# Patient Record
Sex: Female | Born: 1955 | Race: White | Hispanic: No | Marital: Single | Smoking: Never smoker
Health system: Southern US, Community
[De-identification: ages and names within clinical notes are randomized; demographics above are authoritative.]

## PROBLEM LIST (undated history)

## (undated) DIAGNOSIS — M1990 Unspecified osteoarthritis, unspecified site: Secondary | ICD-10-CM

## (undated) DIAGNOSIS — T148XXA Other injury of unspecified body region, initial encounter: Secondary | ICD-10-CM

## (undated) DIAGNOSIS — N950 Postmenopausal bleeding: Secondary | ICD-10-CM

## (undated) DIAGNOSIS — F419 Anxiety disorder, unspecified: Secondary | ICD-10-CM

## (undated) HISTORY — PX: CHOLECYSTECTOMY: SHX55

## (undated) HISTORY — PX: VASCULAR SURGERY: SHX849

---

## 2008-01-30 ENCOUNTER — Ambulatory Visit: Payer: Self-pay | Admitting: Family Medicine

## 2008-02-27 ENCOUNTER — Ambulatory Visit: Payer: Self-pay | Admitting: Family Medicine

## 2008-02-27 IMAGING — CR DG ABDOMEN 3V
1 series · 5 of 5 positions shown · non-contrast
Comparison: none

REASON FOR EXAM: Abdominal pain, nausea, vomiting, diarrhea
COMMENTS:

[Series 1: view not recorded · 0.17mm/px · 5 of 5 slices shown]
[im 1/5]
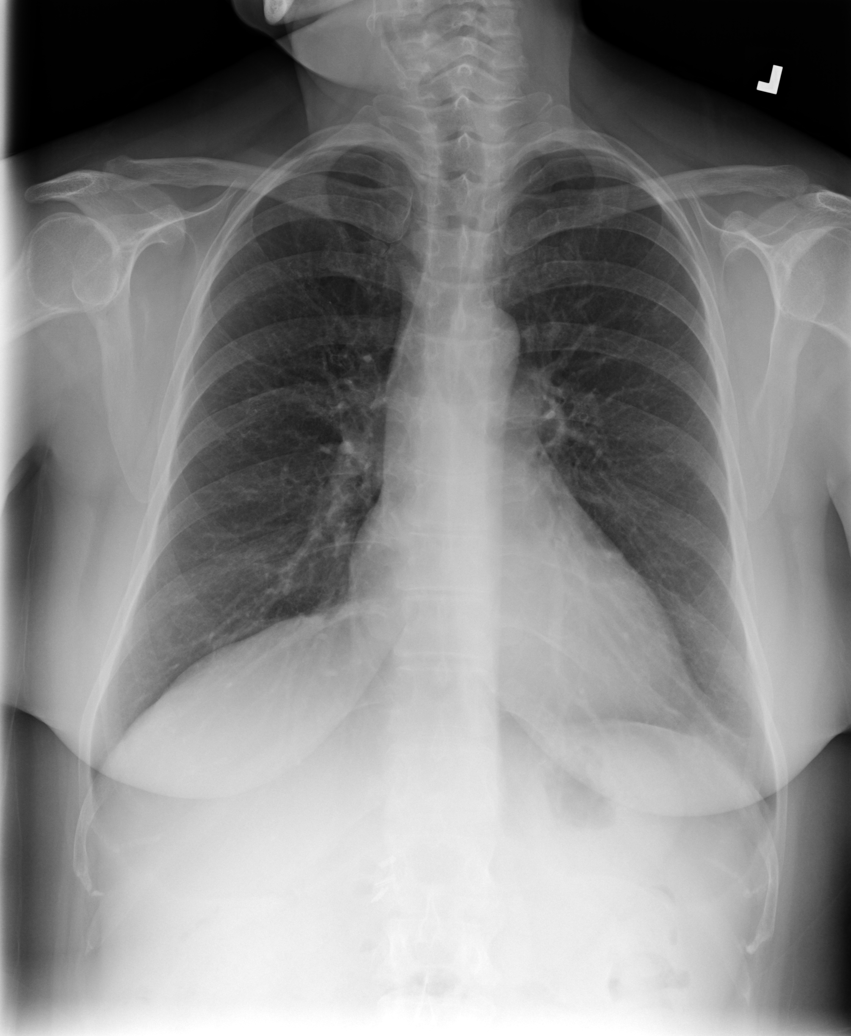
[im 2/5]
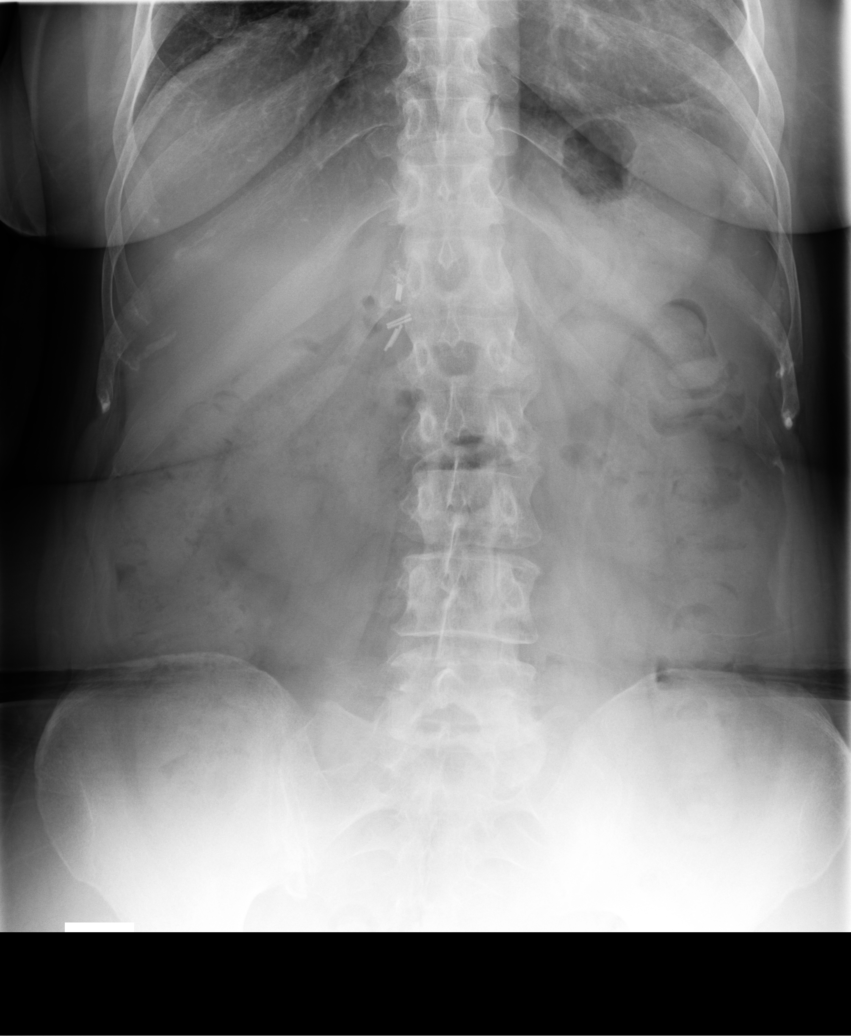
[im 3/5]
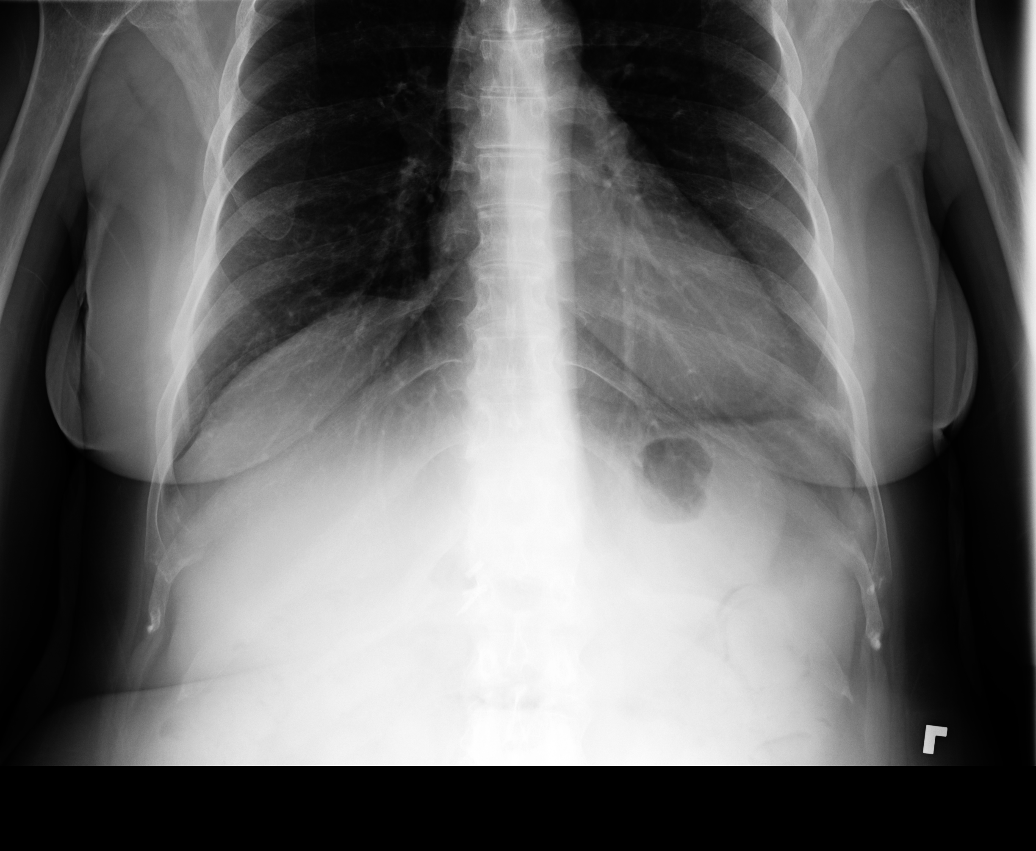
[im 4/5]
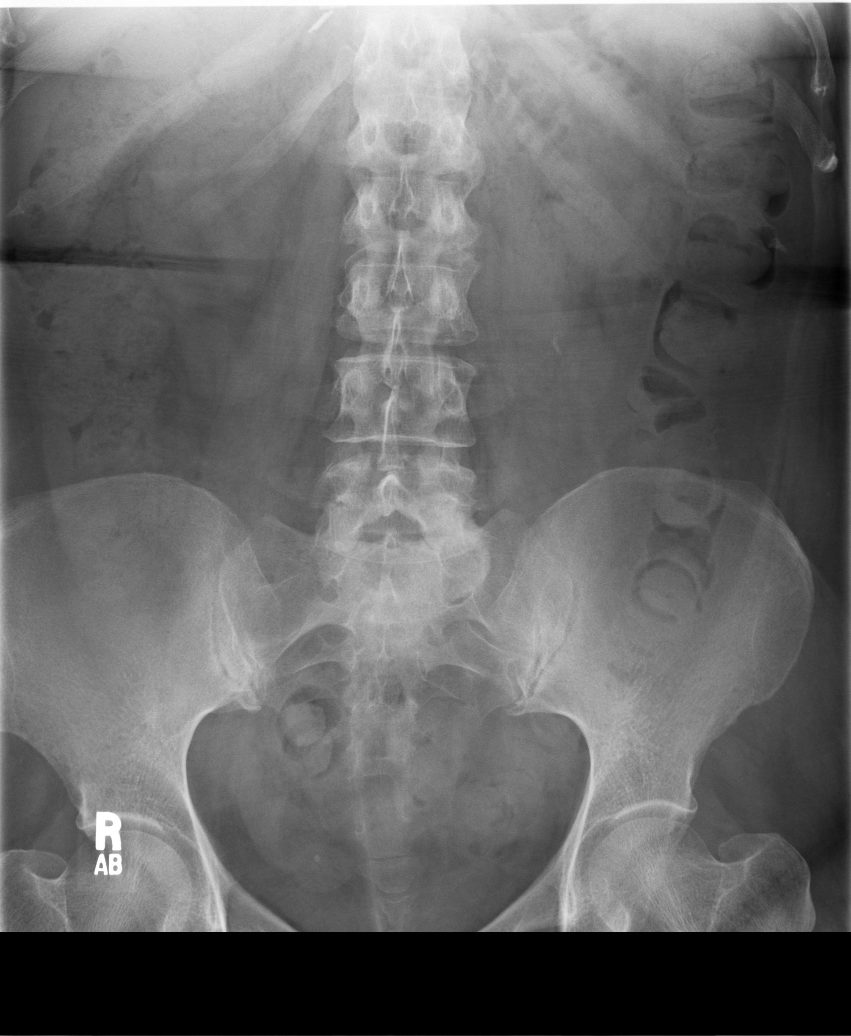
[im 5/5]
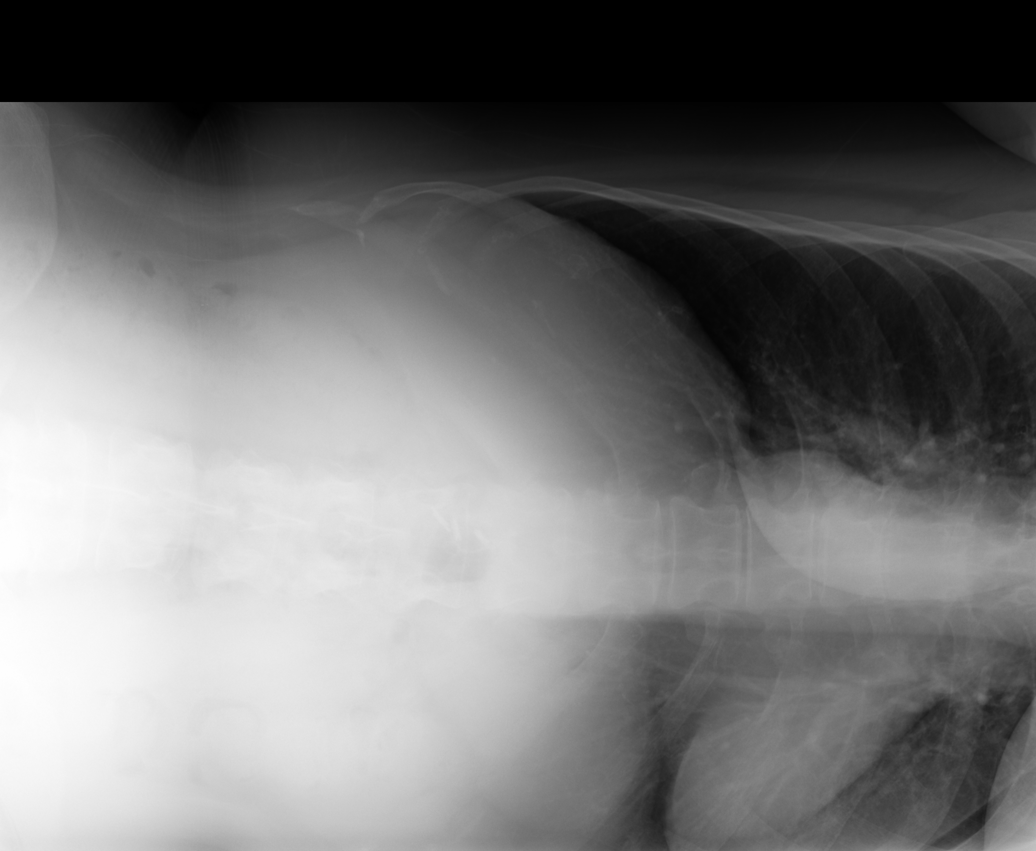

[5 of 5 positions shown; findings below may reference images not displayed]

PROCEDURE:     MDR - MDR ABDOMEN 3-WAY (INCL PA CH)  - [DATE] [DATE]

RESULT:     The lung fields are clear. The heart size is normal.

Four views of the abdomen were obtained. No subdiaphragmatic free air is
seen. There is a moderately large amount of fecal material in the colon. No
dilated loops of bowel suspicious for bowel obstruction are seen. The psoas
margins are visualized bilaterally. No abnormal intra-abdominal
calcifications are noted. No significant osseous abnormalities are
identified.
IMPRESSION: There is a moderately large amount of fecal material in the
colon. Otherwise, normal study.

## 2008-03-03 ENCOUNTER — Ambulatory Visit: Payer: Self-pay | Admitting: Family Medicine

## 2008-03-03 IMAGING — US ABDOMEN ULTRASOUND
1 series · 17 of 25 positions shown · non-contrast
Comparison: none

REASON FOR EXAM: Elevated LFT's, elevated hemidiaphram
COMMENTS:

[Series 1: abdomen ultrasound · 17 of 48 slices shown]
[im 1/48]
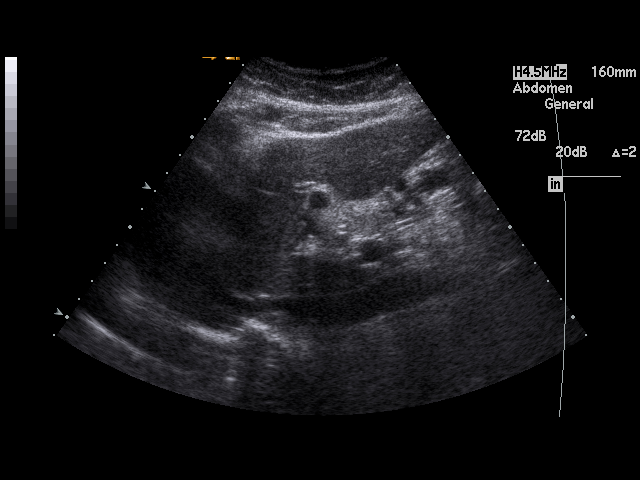
[im 4/48]
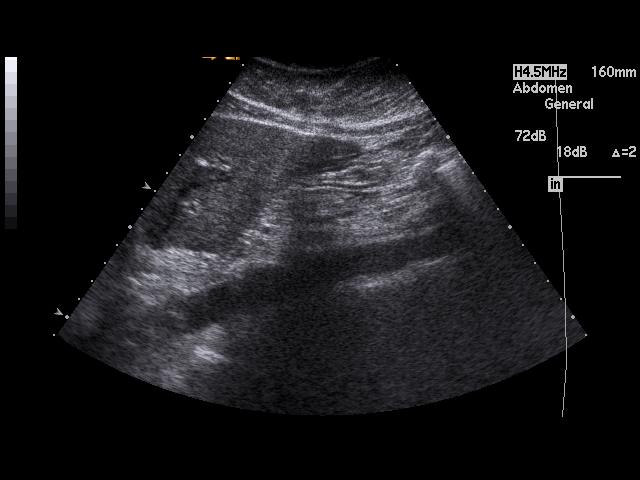
[im 6/48]
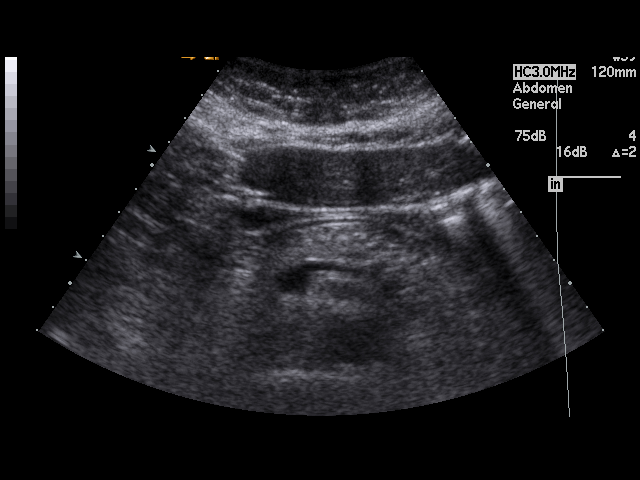
[im 10/48]
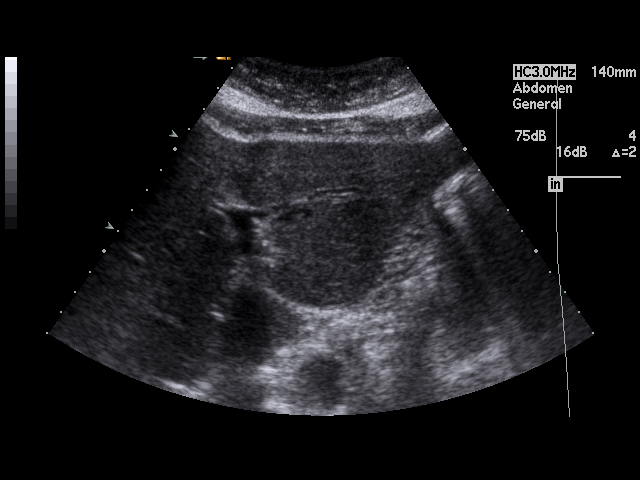
[im 12/48]
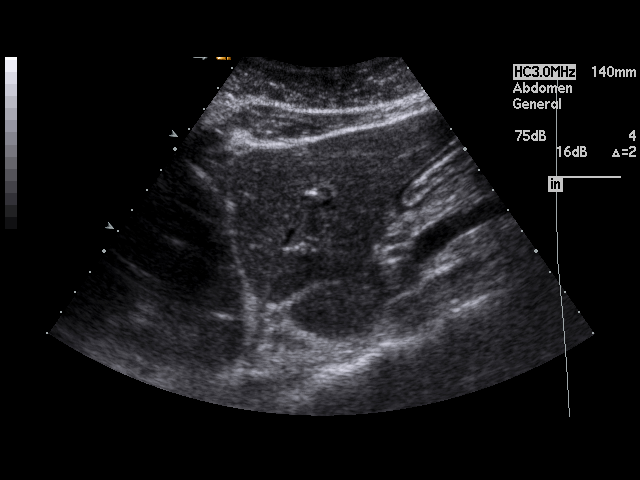
[im 16/48]
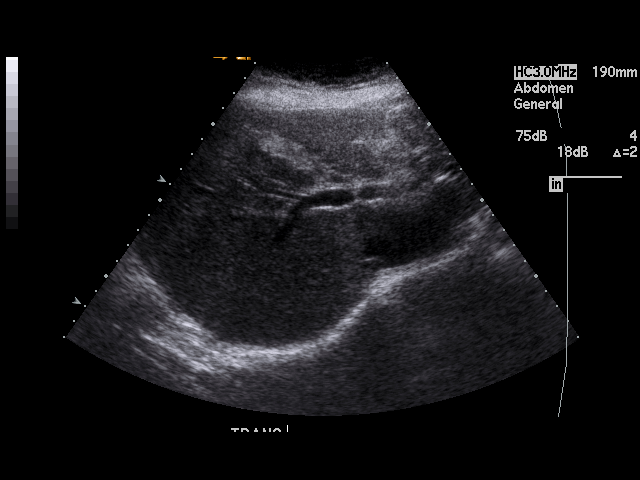
[im 18/48]
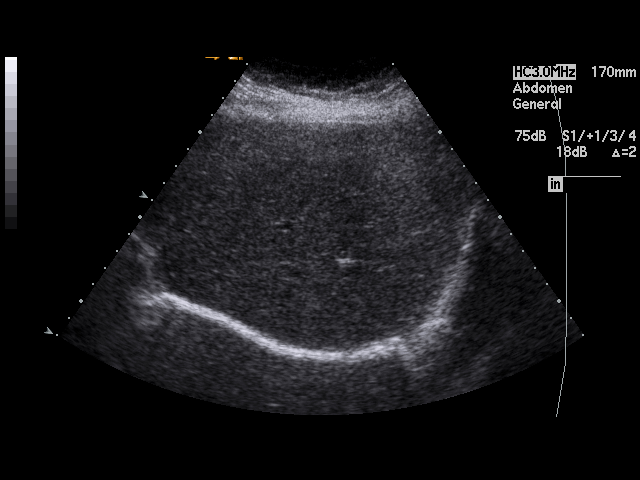
[im 22/48]
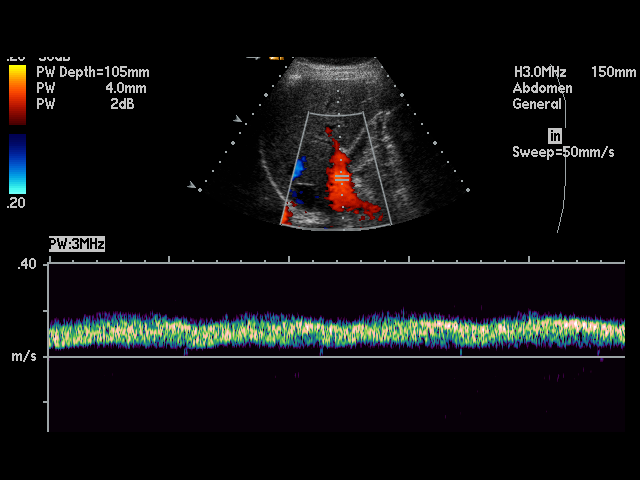
[im 24/48]
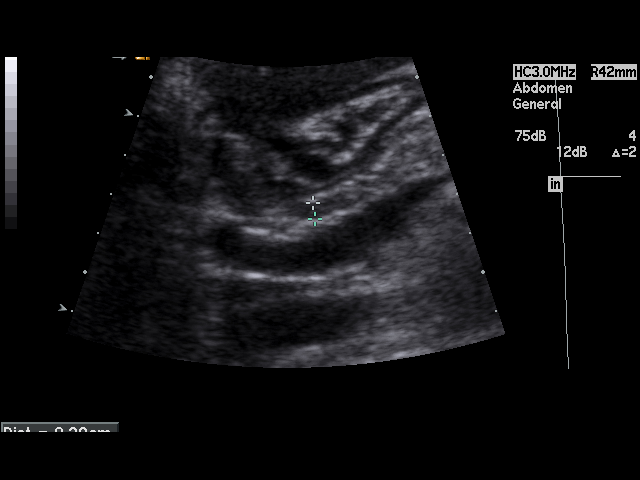
[im 26/48]
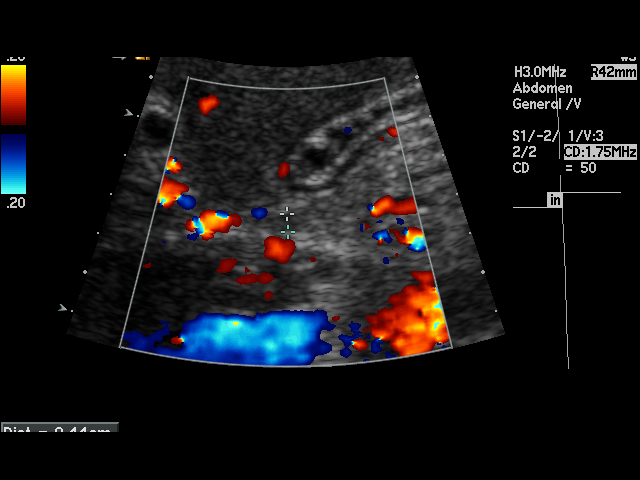
[im 30/48]
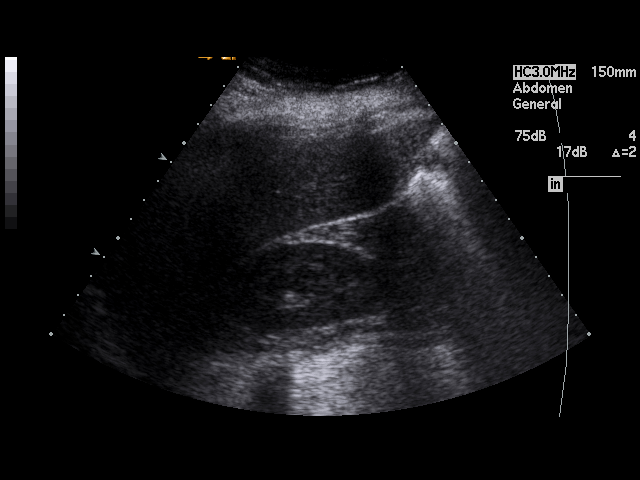
[im 32/48]
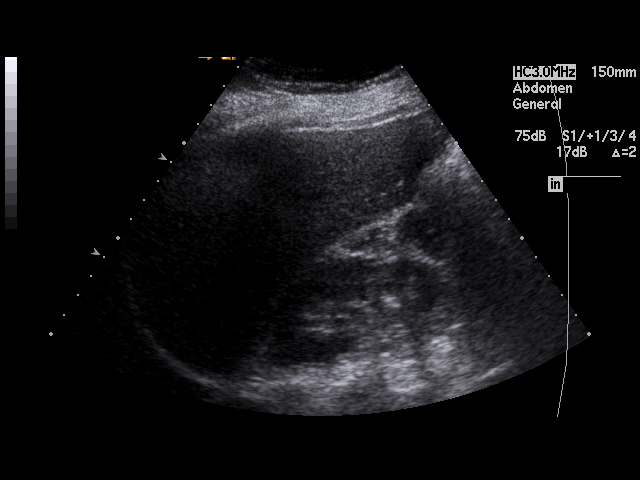
[im 36/48]
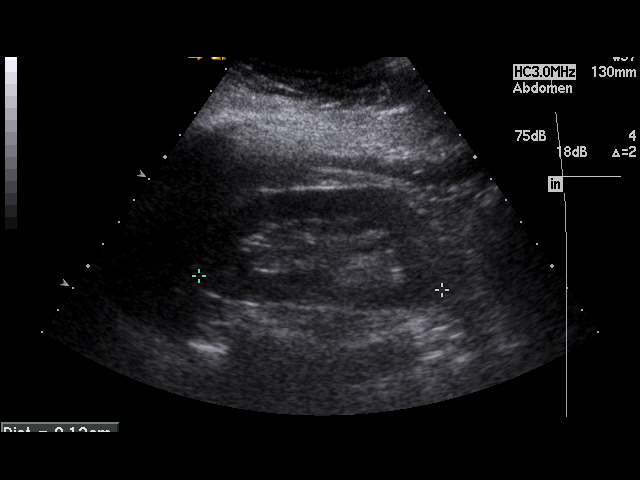
[im 38/48]
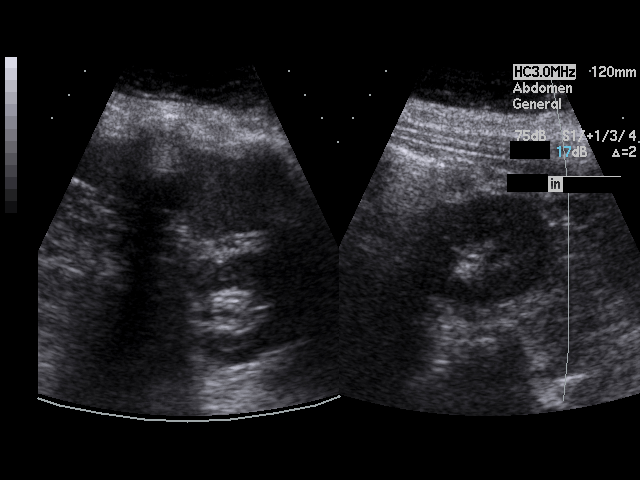
[im 42/48]
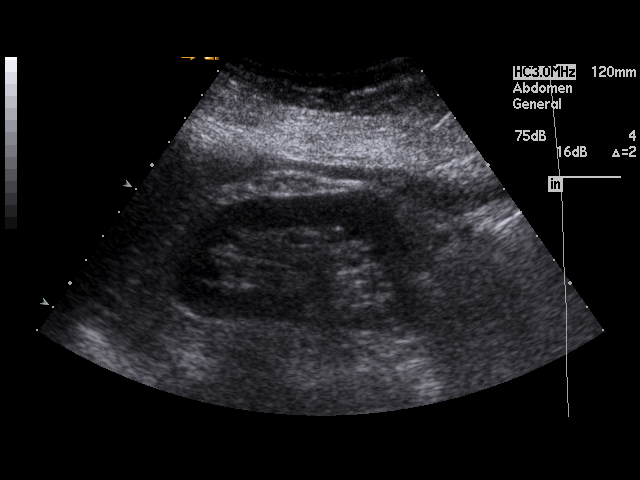
[im 44/48]
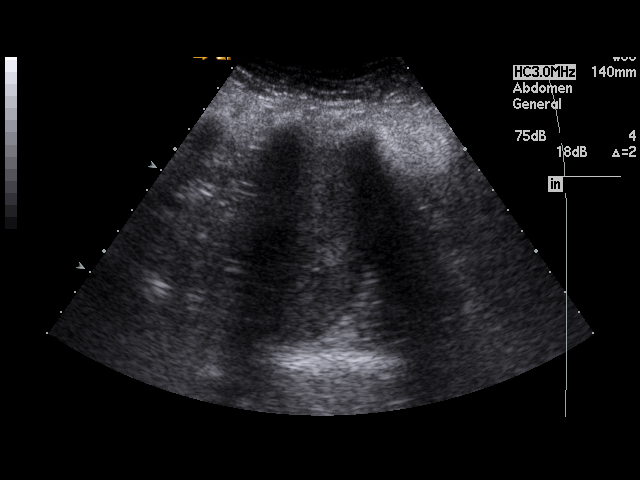
[im 48/48]
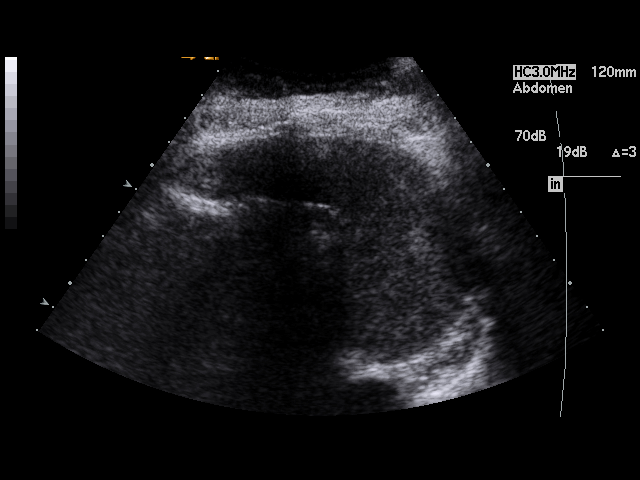

[17 of 25 positions shown; findings below may reference images not displayed]

PROCEDURE:     NOSA - NOSA ABDOMEN GENERAL SURVEY  - [DATE] [DATE]

RESULT:     The hepatic echo pattern is normal in appearance. The spleen
size is normal. There is a hyperechoic area in the spleen which is stable as
compared to the prior exam of [DATE] and likely represents focal
calcification or possibly an angiomyolipoma. The pancreas, abdominal aorta
and inferior vena cava show no significant abnormalities. The patient is
status post cholecystectomy. The common bile duct measures 4.4 mm in
diameter which is within normal limits. The kidneys show no hydronephrosis.
IMPRESSION: 1.  The patient is status post cholecystectomy.
2.  There is a stable, hyperechoic focus in the spleen.

## 2008-04-14 ENCOUNTER — Ambulatory Visit: Payer: Self-pay | Admitting: Internal Medicine

## 2008-08-22 ENCOUNTER — Ambulatory Visit: Payer: Self-pay | Admitting: Family Medicine

## 2009-03-05 ENCOUNTER — Ambulatory Visit: Payer: Self-pay | Admitting: Family Medicine

## 2009-07-11 ENCOUNTER — Emergency Department: Payer: Self-pay | Admitting: Emergency Medicine

## 2009-07-11 IMAGING — CR DG KNEE COMPLETE 4+V*R*
1 series · 4 of 4 positions shown · non-contrast
Comparison: none

REASON FOR EXAM: injury
COMMENTS:

[Series 1: view not recorded · 0.17mm/px · 4 of 4 slices shown]
[im 1/4]
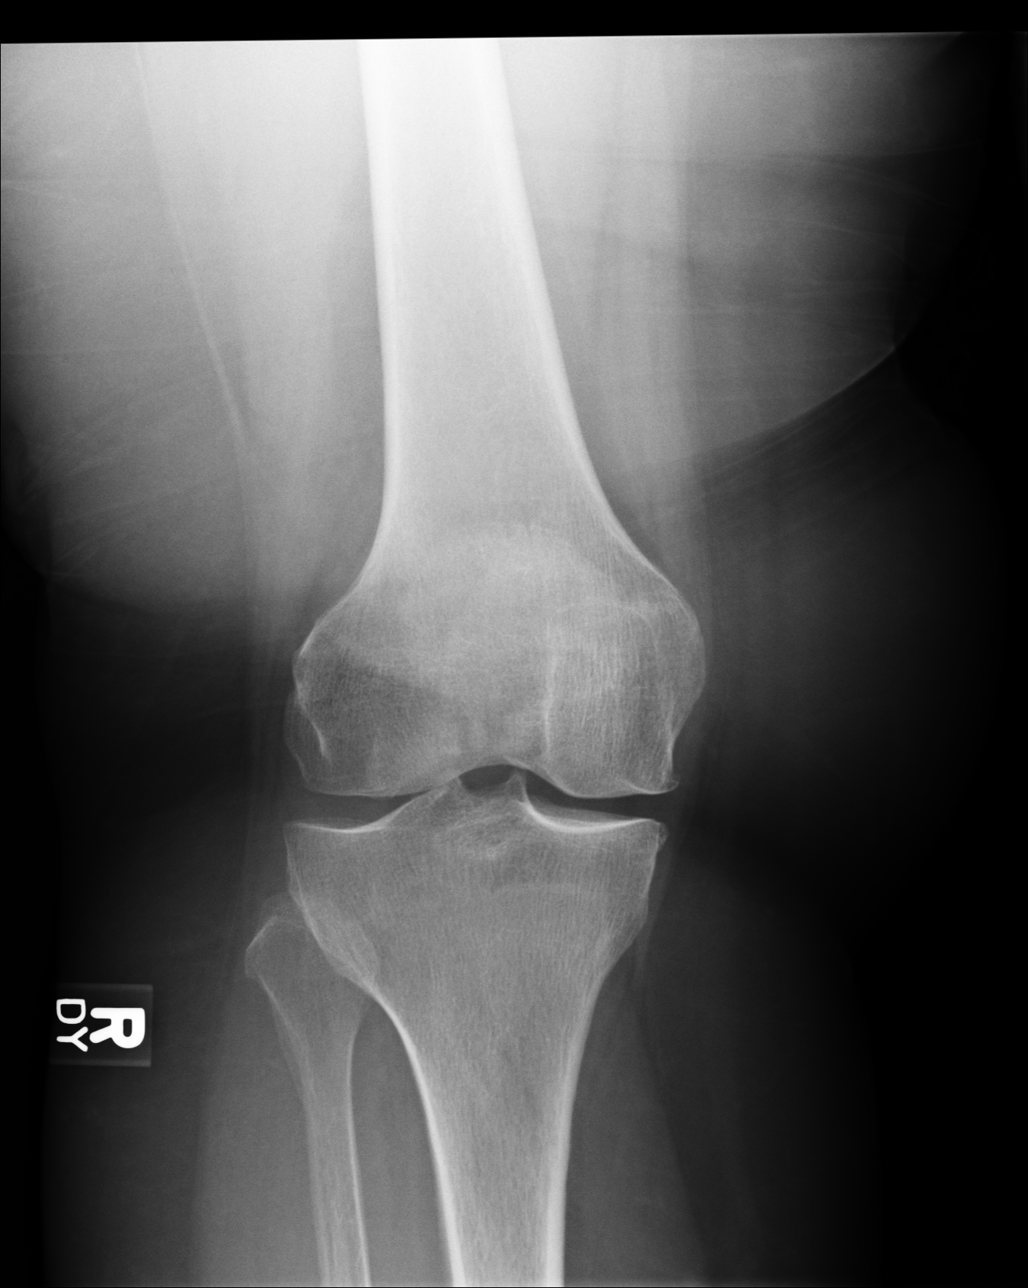
[im 2/4]
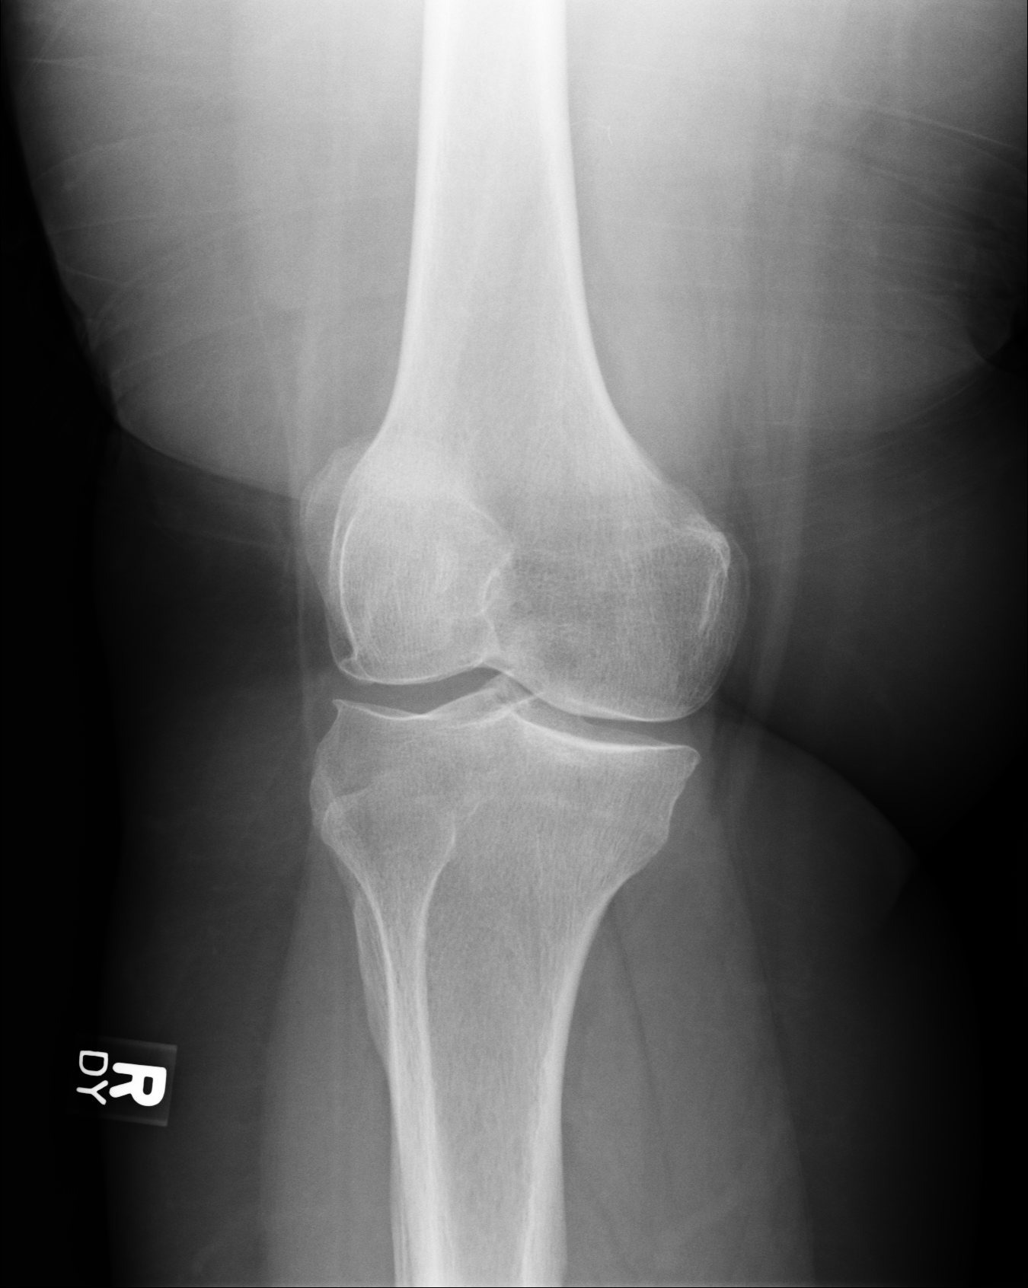
[im 3/4]
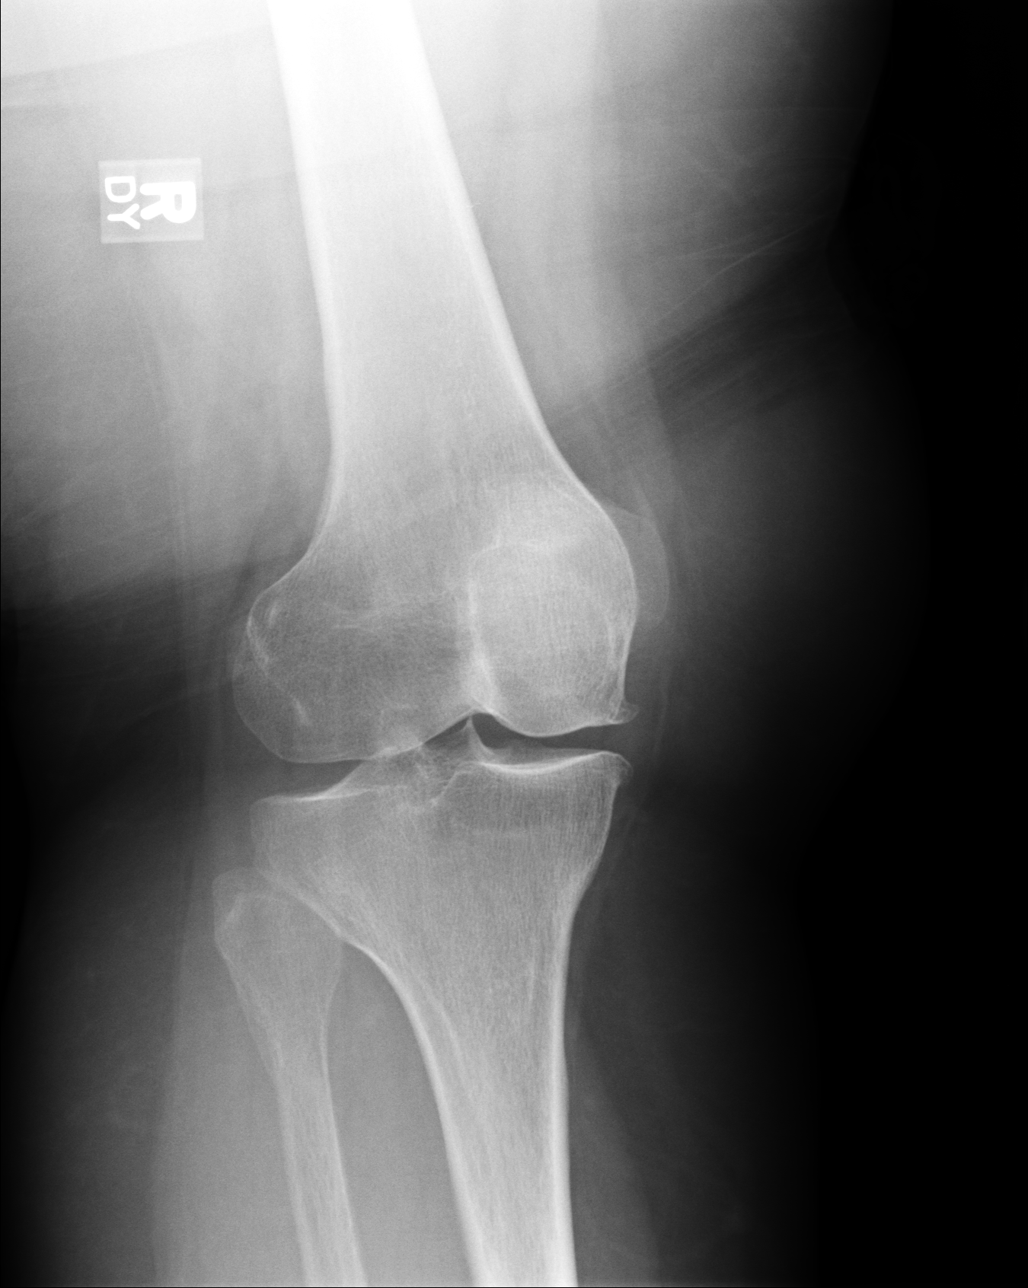
[im 4/4]
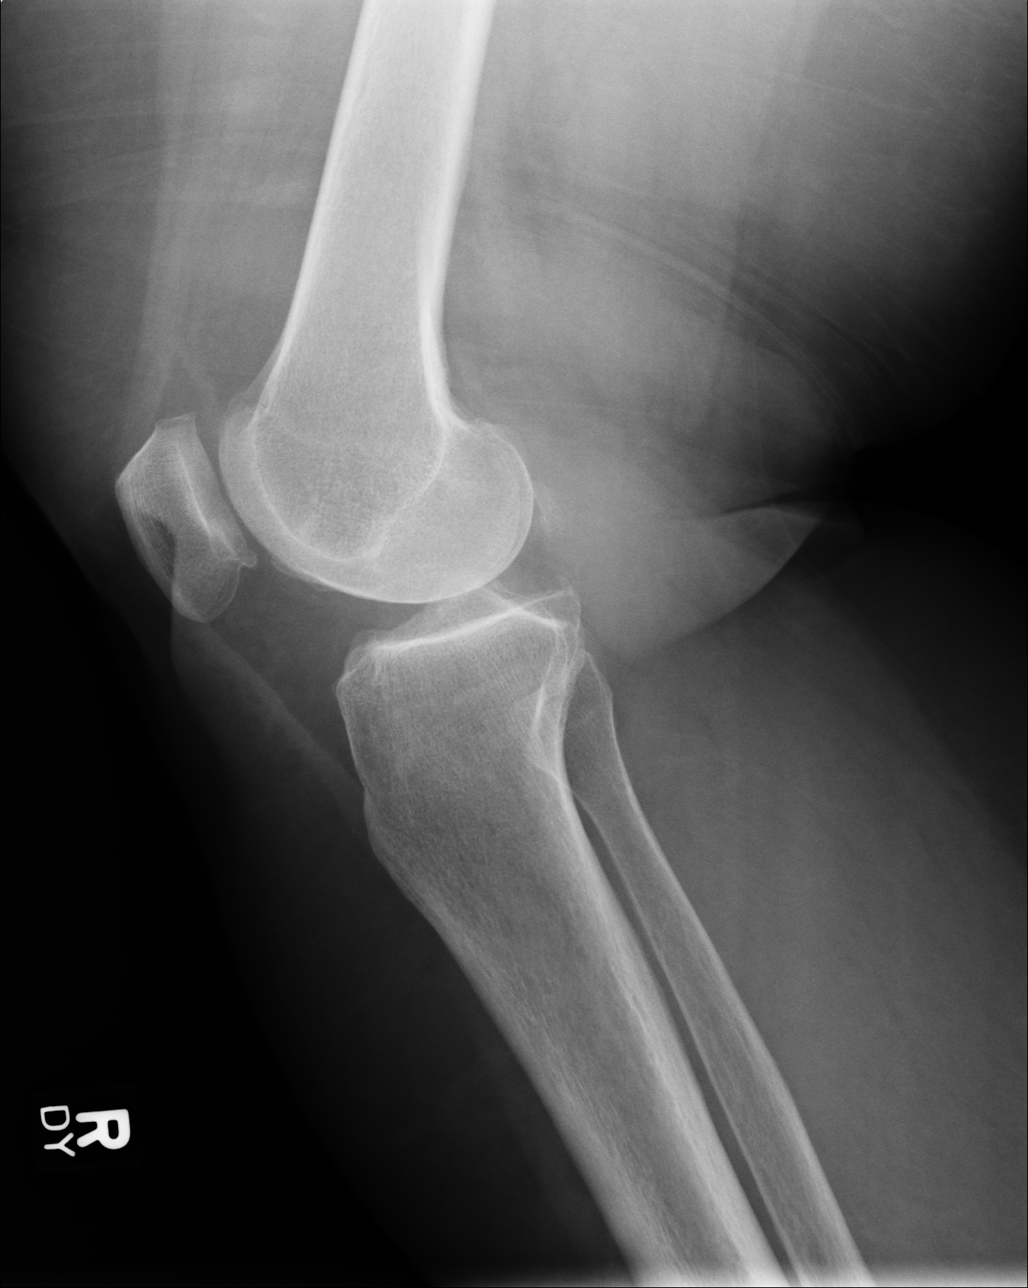

[4 of 4 positions shown; findings below may reference images not displayed]

PROCEDURE:     DXR - DXR KNEE RT COMP WITH OBLIQUES  - [DATE] [DATE]

RESULT:     No fracture, dislocation or other acute bony abnormality is
identified. There is mild degenerative spurring at the knee medially and
laterally. The knee joint space is well maintained. In the lateral view,
there is noted mild dorsal patella spurring.
IMPRESSION: 1. No fracture is seen.
2. There is degenerative spurring about the knee bilaterally.
3. There is also noted mild dorsal patella spurring.

## 2010-01-28 ENCOUNTER — Ambulatory Visit: Payer: Self-pay | Admitting: Internal Medicine

## 2010-02-13 ENCOUNTER — Ambulatory Visit: Payer: Self-pay | Admitting: Family Medicine

## 2010-02-16 ENCOUNTER — Ambulatory Visit: Payer: Self-pay | Admitting: Family Medicine

## 2010-02-16 IMAGING — CT CT ABD-PELV W/ CM
1 of 2 series · 15 of 32 positions shown, 19 images · non-contrast
Comparison: none

REASON FOR EXAM: Elevated LFT's Vomiting Diarrhea
COMMENTS:

[Series 3: soft tissue · axial · 0.70mm/px · z∈[-988,-574]mm · 15 of 152 slices shown, 19 images]
[im 7/152  soft-tissue]
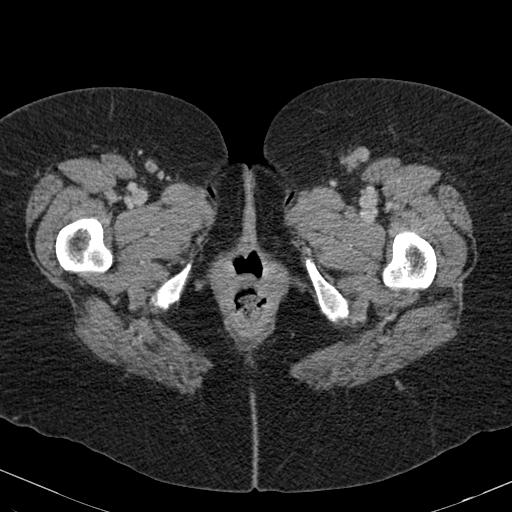
[im 7/152  bone]
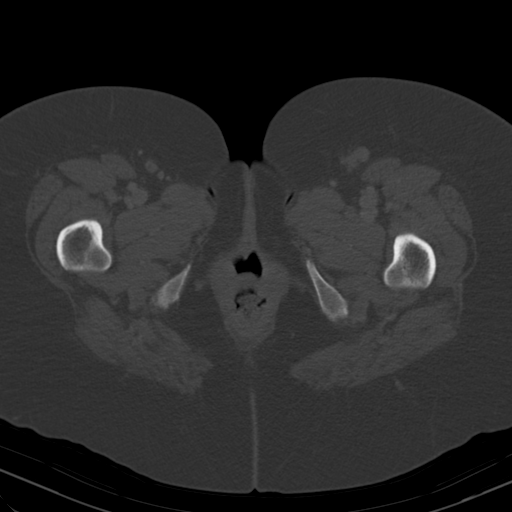
[im 20/152  soft-tissue]
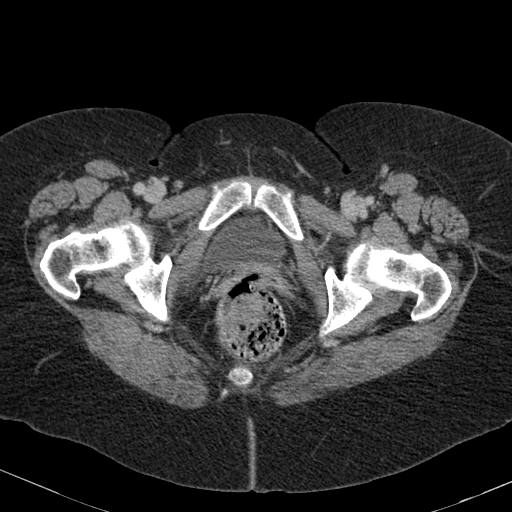
[im 33/152  soft-tissue]
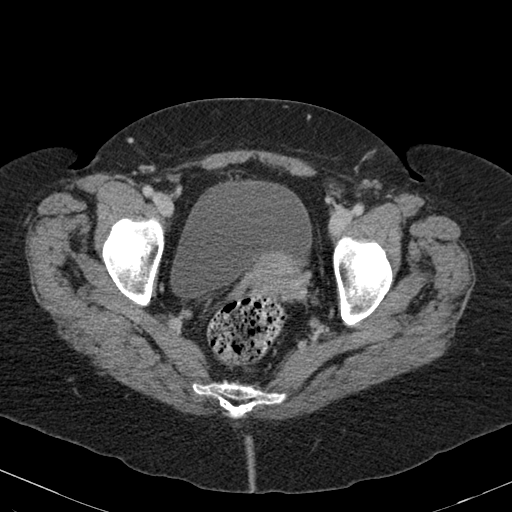
[im 40/152  soft-tissue]
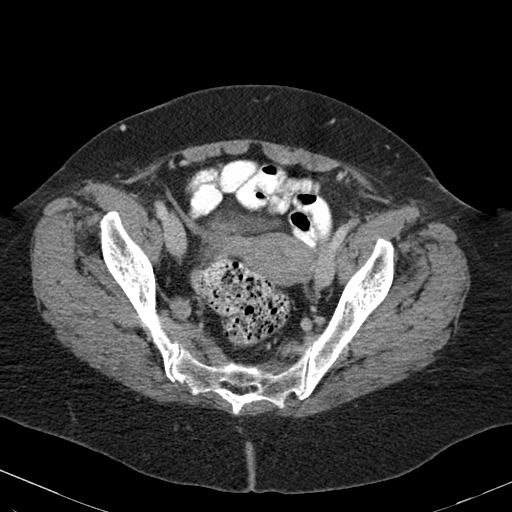
[im 53/152  soft-tissue]
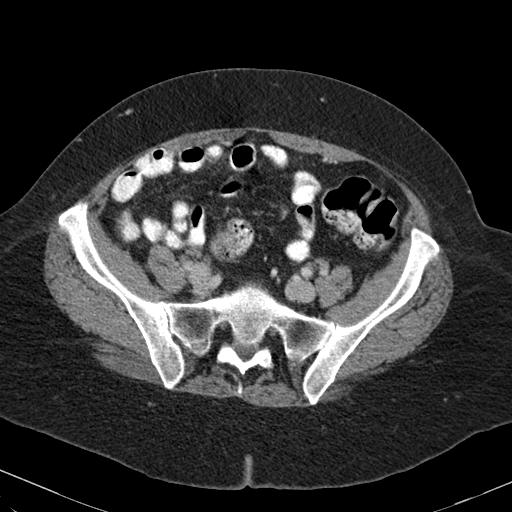
[im 66/152  soft-tissue]
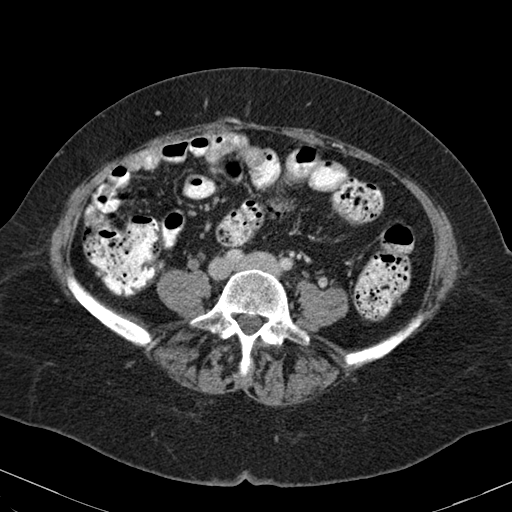
[im 79/152  soft-tissue]
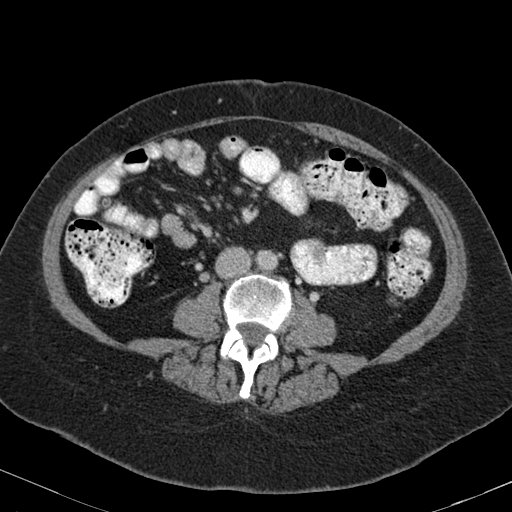
[im 86/152  soft-tissue]
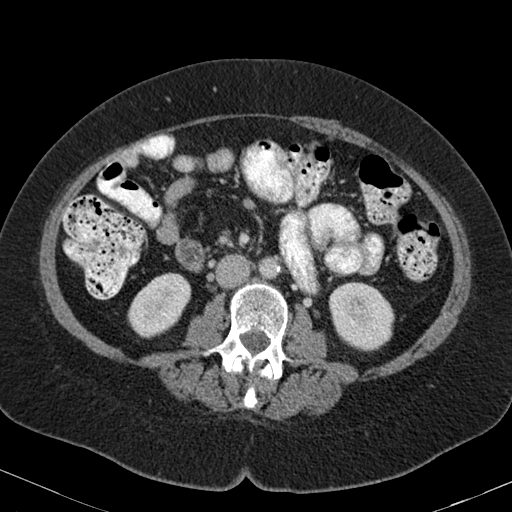
[im 99/152  soft-tissue]
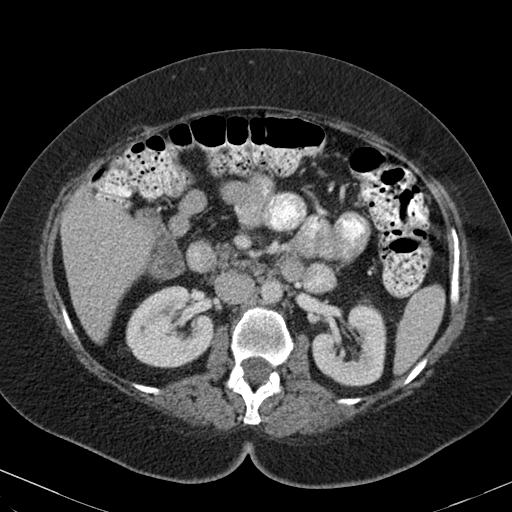
[im 99/152  bone]
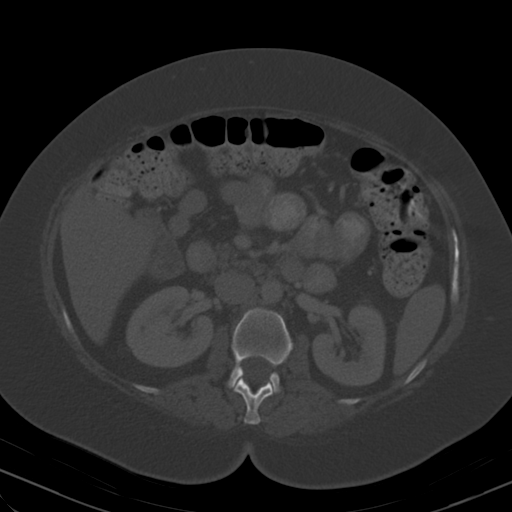
[im 112/152  soft-tissue]
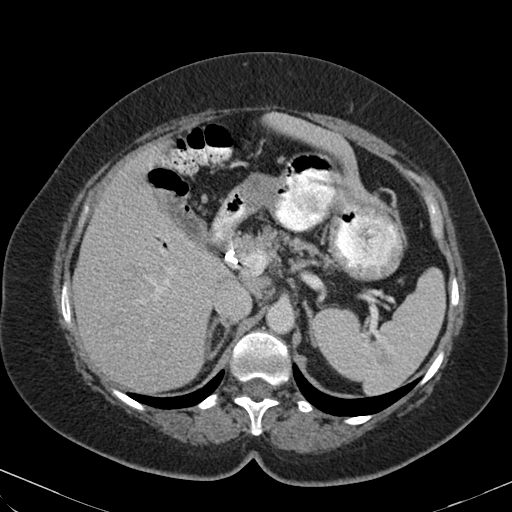
[im 119/152  soft-tissue]
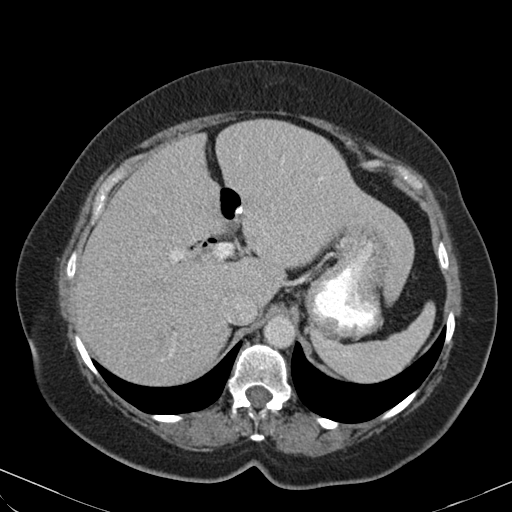
[im 125/152  lung]
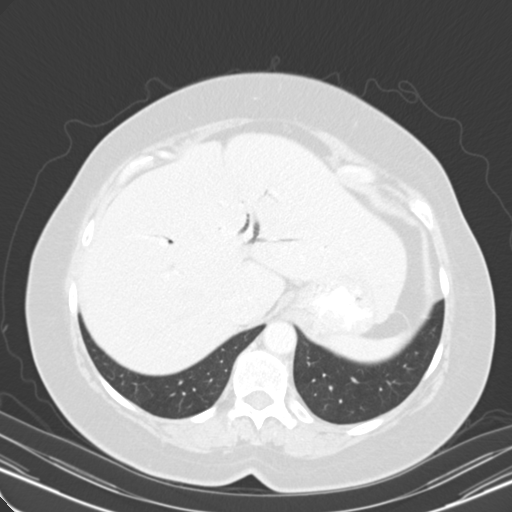
[im 132/152  soft-tissue]
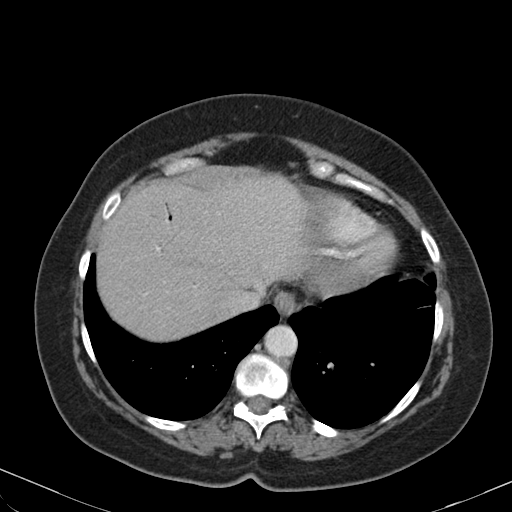
[im 132/152  lung]
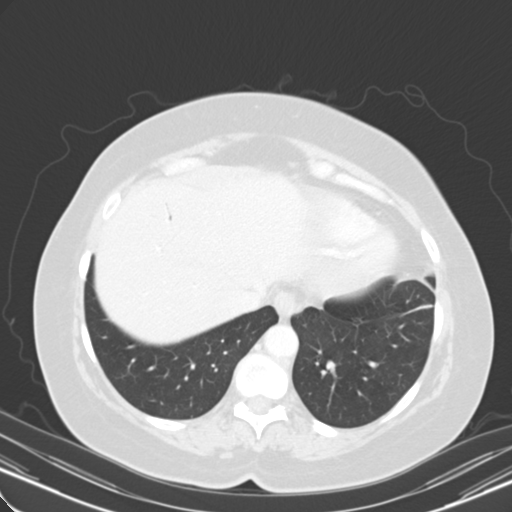
[im 138/152  lung]
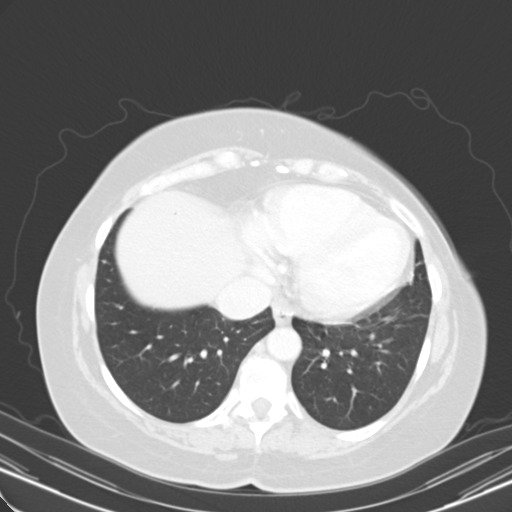
[im 145/152  soft-tissue]
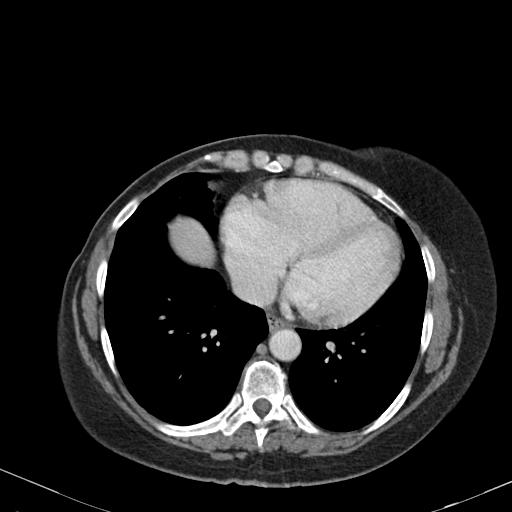
[im 145/152  lung]
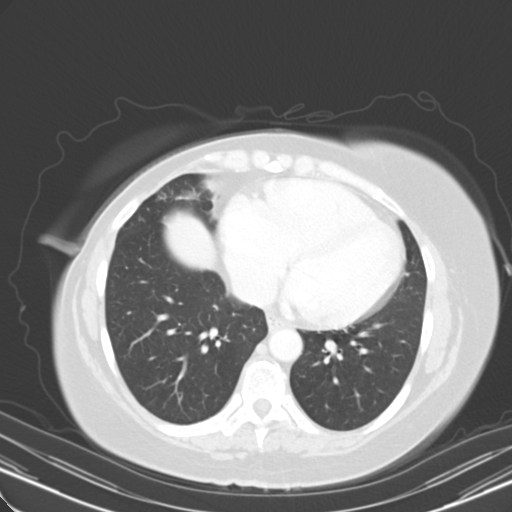

[15 of 32 positions shown; findings below may reference images not displayed]

PROCEDURE:     JEONG SUK - JEONG SUK ABDOMEN / PELVIS W  - [DATE]  [DATE]

RESULT:     Axial CT scanning was performed through the abdomen and pelvis
at 3 mm intervals and slice thicknesses following intravenous administration
of 100 cc of Isovue 300. The patient also received oral contrast material.
Review of 3-dimensional reconstructed images was performed separately on the
VIA monitor.

The liver exhibits normal density. There is biliary tract gas. The
gallbladder is apparently surgically absent. The pancreas exhibits fatty
infiltrative changes. There is no focal mass. The spleen, partially
distended stomach, adrenal glands, and kidneys are normal in appearance. The
caliber of the abdominal aorta is normal. There is no periaortic or
pericaval lymphadenopathy.

The partially contrast-filled loops of small and large bowel are normal in
appearance. The appendix is demonstrated and appears normal. The partially
distended urinary bladder is normal in appearance. The uterus and adnexal
structures appear normal for age. There is no free fluid in the pelvis.
There is no evidence of an inguinal nor umbilical hernia. The lumbar
vertebral bodies are preserved in height. The lung bases are clear.
IMPRESSION: 1. There is gas within the biliary tree likely due to prior sphincterotomy.
The gallbladder is surgically absent. I do not see evidence of hepatic
masses nor evidence of acute pancreatitis.
2. I do not see evidence of splenomegaly or intra-abdominal or pelvic
lymphadenopathy.
3. There is no evidence of acute urinary tract abnormality nor acute bowel
abnormality.

## 2010-05-19 ENCOUNTER — Ambulatory Visit: Payer: Self-pay | Admitting: Family Medicine

## 2012-09-12 ENCOUNTER — Emergency Department: Payer: Self-pay | Admitting: Internal Medicine

## 2012-09-12 LAB — URINALYSIS, COMPLETE
Bilirubin,UR: NEGATIVE
Glucose,UR: NEGATIVE mg/dL (ref 0–75)
Hyaline Cast: 1
Nitrite: NEGATIVE
Ph: 5 (ref 4.5–8.0)
Specific Gravity: 1.024 (ref 1.003–1.030)
Squamous Epithelial: 10

## 2012-09-12 LAB — CBC
HGB: 14.7 g/dL (ref 12.0–16.0)
MCV: 93 fL (ref 80–100)
RDW: 13.4 % (ref 11.5–14.5)
WBC: 8.5 10*3/uL (ref 3.6–11.0)

## 2012-09-12 LAB — CK TOTAL AND CKMB (NOT AT ARMC): CK-MB: 1.5 ng/mL (ref 0.5–3.6)

## 2012-09-12 LAB — COMPREHENSIVE METABOLIC PANEL
Albumin: 3.5 g/dL (ref 3.4–5.0)
Alkaline Phosphatase: 121 U/L (ref 50–136)
BUN: 10 mg/dL (ref 7–18)
Bilirubin,Total: 0.5 mg/dL (ref 0.2–1.0)
Chloride: 104 mmol/L (ref 98–107)
Creatinine: 0.58 mg/dL — ABNORMAL LOW (ref 0.60–1.30)
Osmolality: 277 (ref 275–301)
Potassium: 4 mmol/L (ref 3.5–5.1)
SGPT (ALT): 64 U/L (ref 12–78)
Sodium: 139 mmol/L (ref 136–145)
Total Protein: 8.1 g/dL (ref 6.4–8.2)

## 2012-09-12 LAB — TROPONIN I: Troponin-I: <0.02 ng/mL

## 2012-09-12 LAB — PROTIME-INR: Prothrombin Time: 12.3 secs (ref 11.5–14.7)

## 2012-09-12 IMAGING — CR DG CHEST 1V PORT
1 series · 1 of 1 positions shown · non-contrast
Comparison: none

REASON FOR EXAM: Chest Pain
COMMENTS:

[ap]
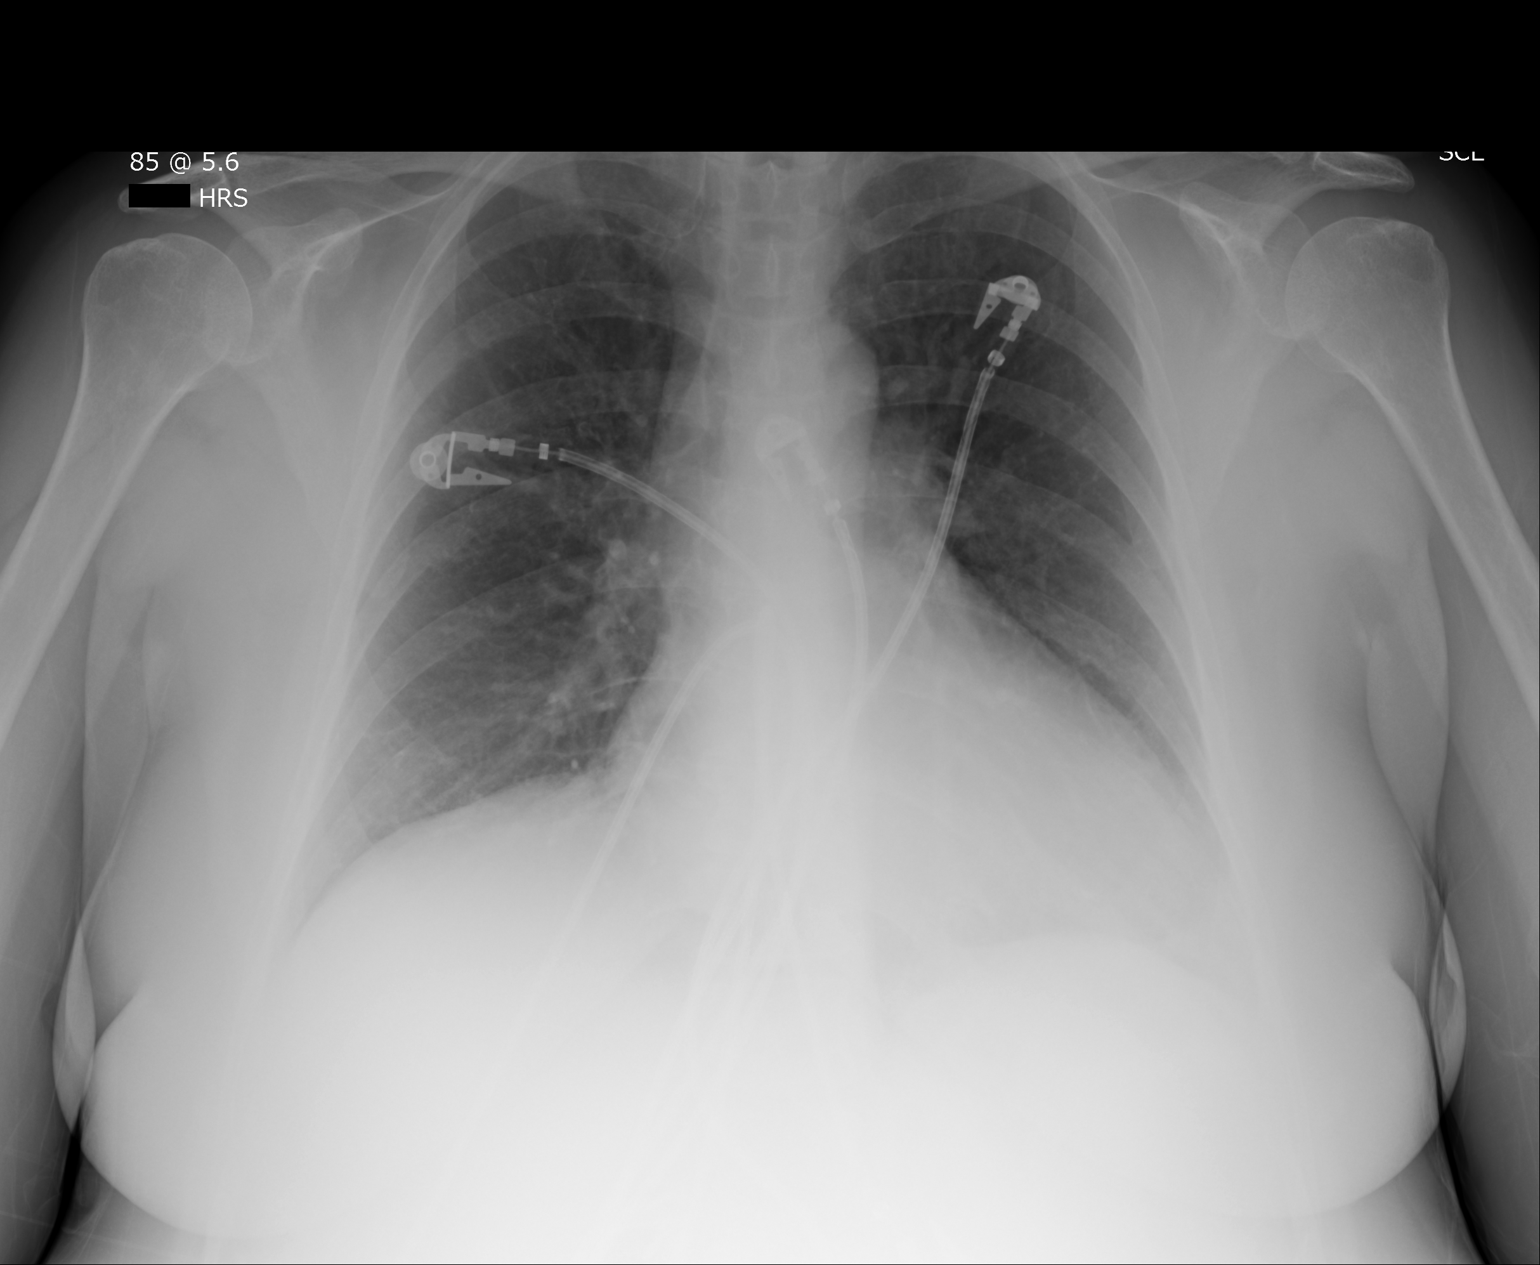

[1 of 1 positions shown; findings below may reference images not displayed]

PROCEDURE:     DXR - DXR PORTABLE CHEST SINGLE VIEW  - [DATE]  [DATE]

RESULT:     The lungs are adequately inflated. There is no focal infiltrate.
There is no evidence of a pneumothorax or pneumomediastinum. The cardiac
silhouette is mildly enlarged. The pulmonary vascularity is not engorged.
There is no pleural effusion. The mediastinum is normal in width.
IMPRESSION: There is mild enlargement of the cardiac silhouette without
overt evidence of CHF nor of pneumonia. A followup PA and lateral chest
x-ray would be of value.

[REDACTED]

## 2012-09-12 IMAGING — CT CT CHEST W/ CM
1 series · 15 of 33 positions shown, 19 images · IV contrast (APPLIED)
Comparison: none

REASON FOR EXAM: sob
COMMENTS:

PROCEDURE:     CT  - CT CHEST (FOR PE) W  - [DATE] [DATE]
RESULT:     Comparison: CT of the abdomen and pelvis [DATE]
TECHNIQUE: Multiple thin section axial images were obtained from the lung
apices to the upper abdomen following 100 ml Isovue 370 intravenous
contrast, according to the PE protocol. These images were also reviewed on a
[employer] multiplanar work station.

[Series 4: soft tissue · axial · 0.74mm/px · z∈[-634,-376]mm · 15 of 102 slices shown, 19 images]
[im 8/102  mediastinal]
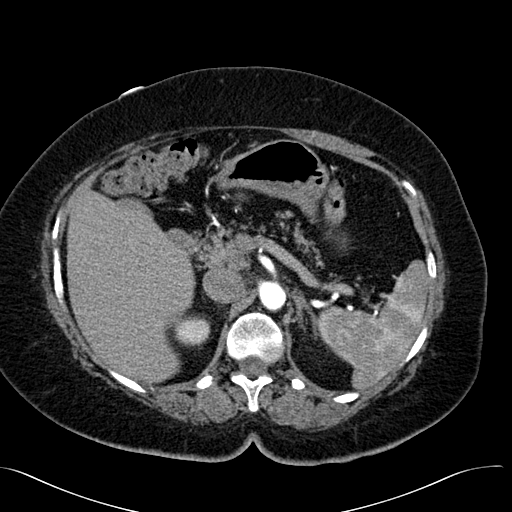
[im 8/102  lung]
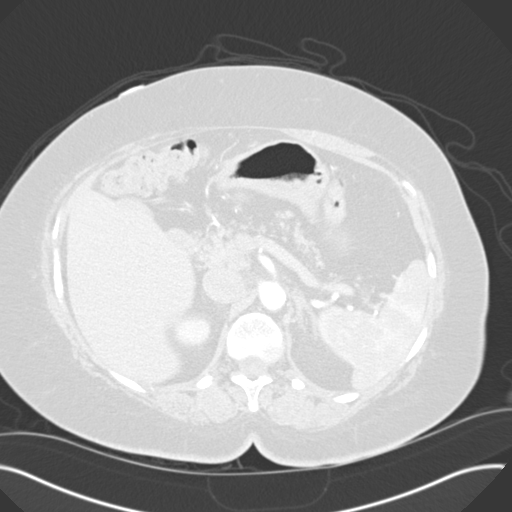
[im 15/102  lung]
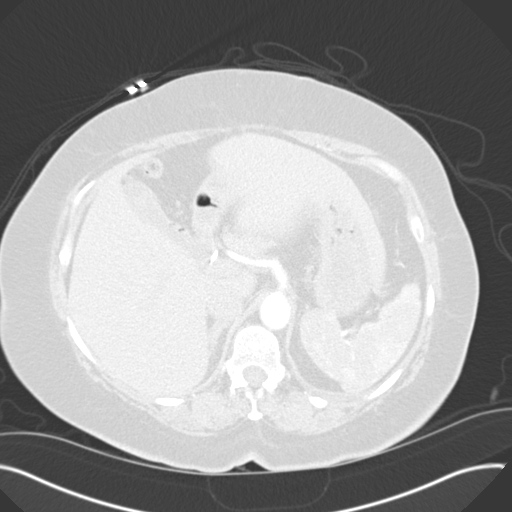
[im 21/102  lung]
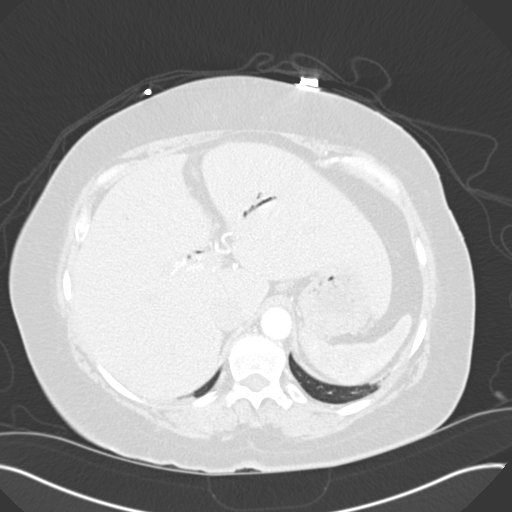
[im 27/102  lung]
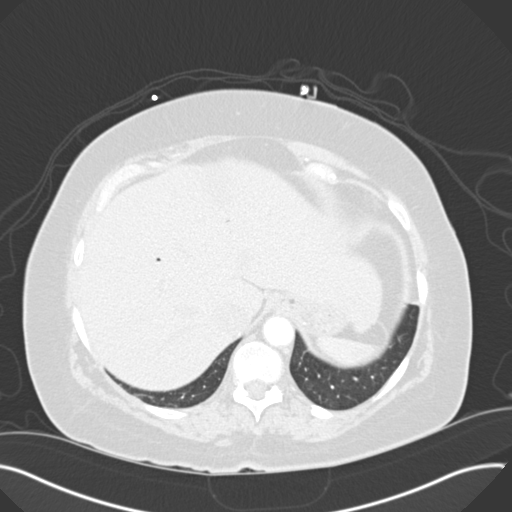
[im 34/102  mediastinal]
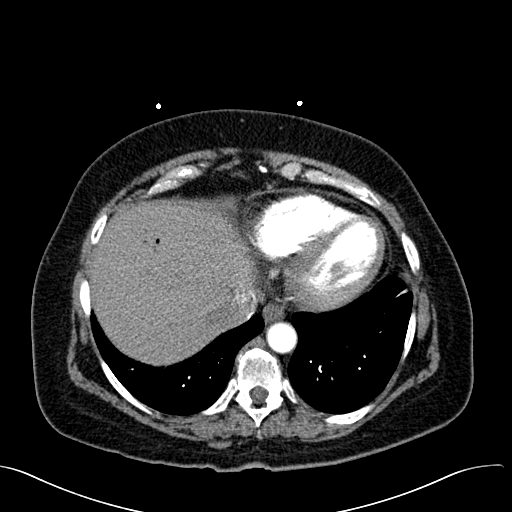
[im 34/102  lung]
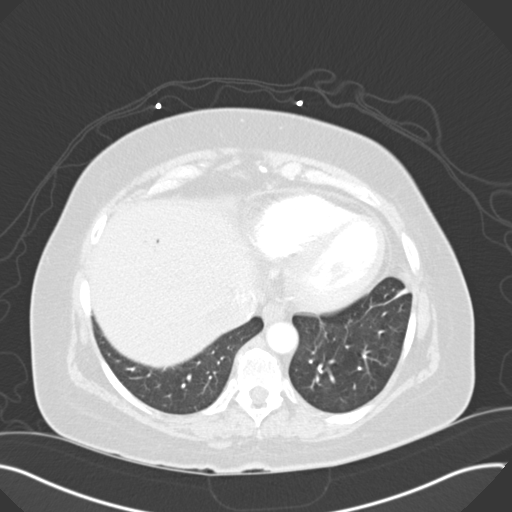
[im 41/102  lung]
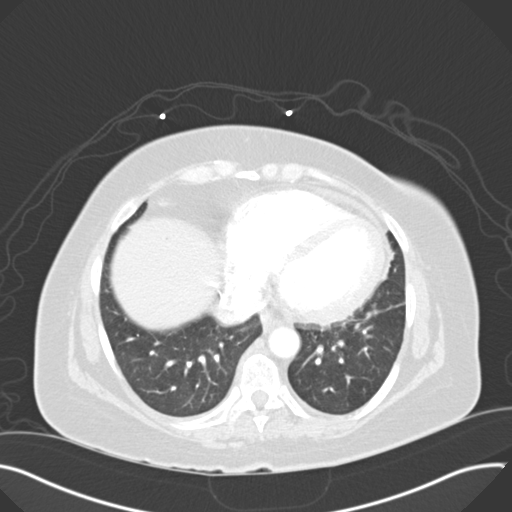
[im 45/102  lung]
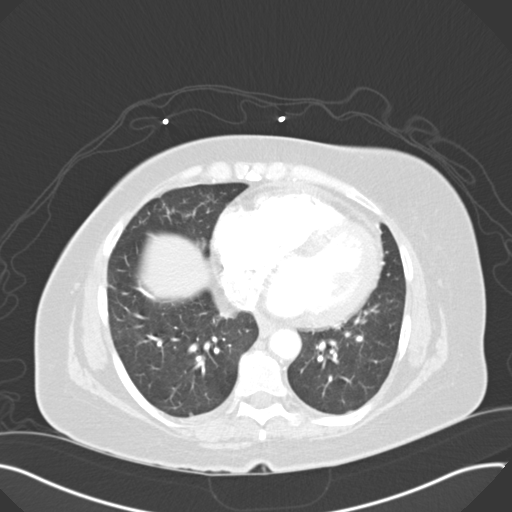
[im 53/102  lung]
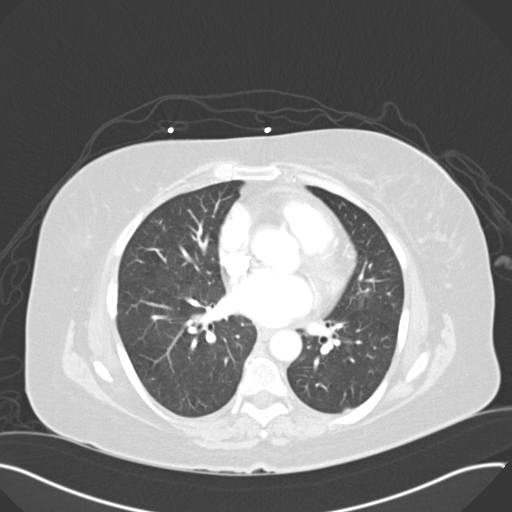
[im 57/102  mediastinal]
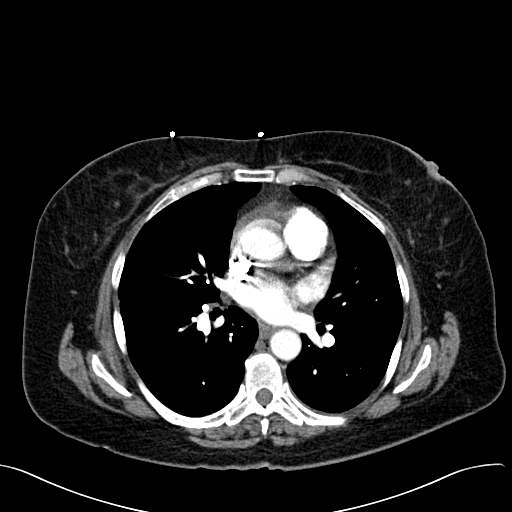
[im 57/102  lung]
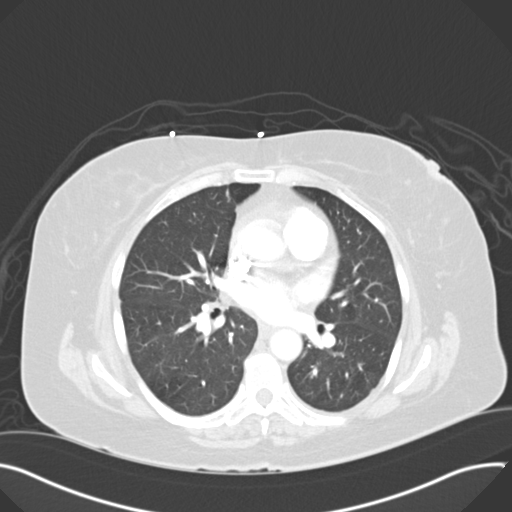
[im 61/102  lung]
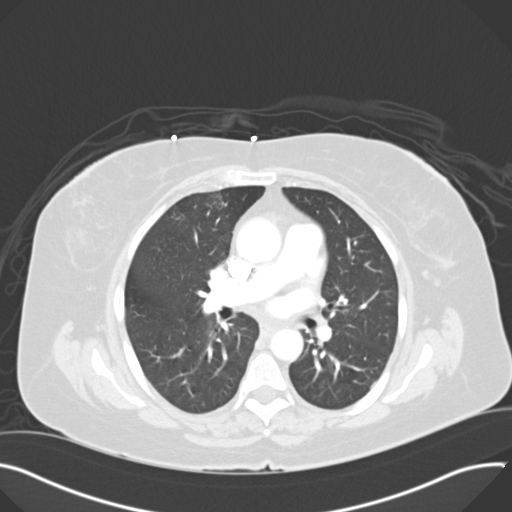
[im 68/102  lung]
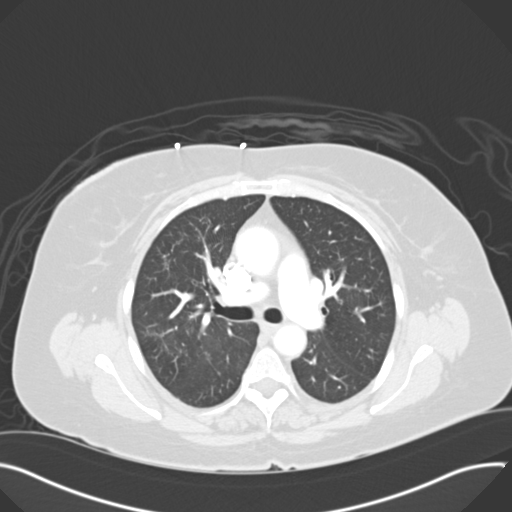
[im 75/102  lung]
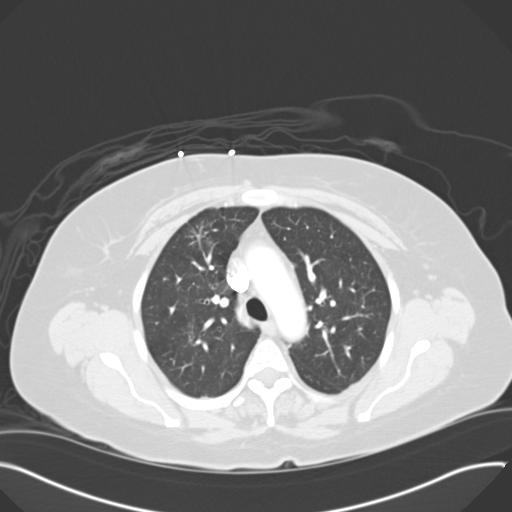
[im 81/102  mediastinal]
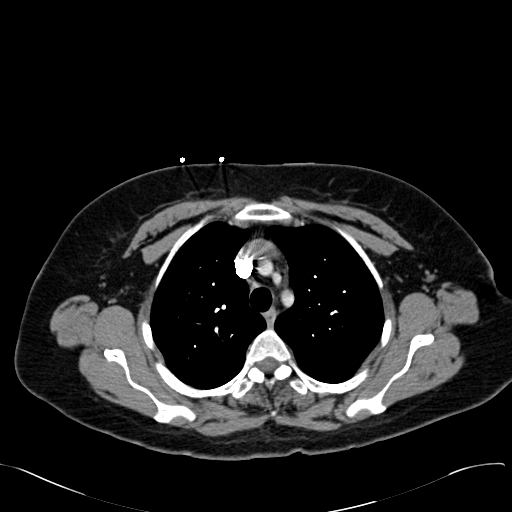
[im 81/102  lung]
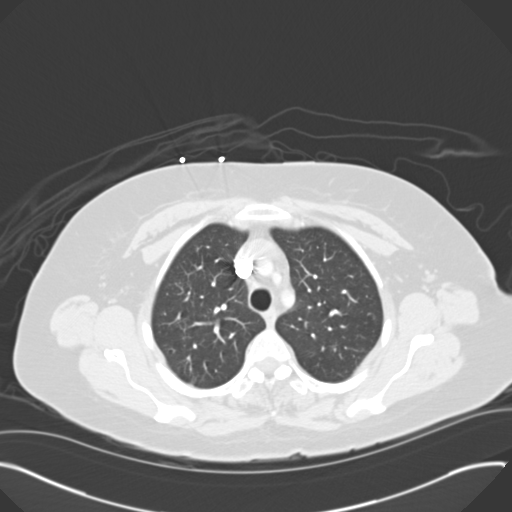
[im 87/102  lung]
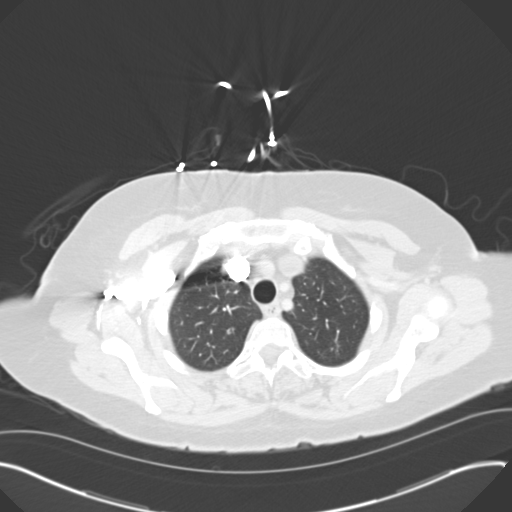
[im 94/102  lung]
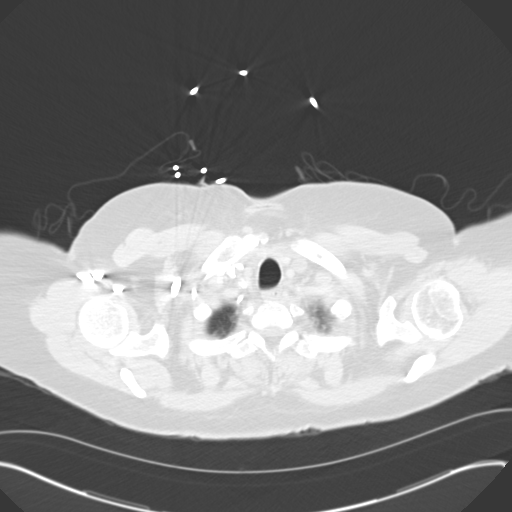

[15 of 33 positions shown; findings below may reference images not displayed]

FINDINGS: No mediastinal, hilar, or axillary lymphadenopathy. There is air within the
central intrahepatic intrahepatic biliary tree which is similar to prior and
left related to sphincterotomy. Lobulated low-attenuation in the gallbladder
fossa is like related to the small bowel seen on the prior CT of the abdomen
and pelvis.

The thoracic aorta is normal in caliber. No pulmonary embolus identified.
There are multiple reticulonodular opacities in the right upper lobe, many
of which are centrilobular in distribution. These are seen to a lesser
extent in the right middle lobe. These are subcentimeter in size. The
central airways are patent. There are a few 3-4 mm nodules in the left lung.

No aggressive lytic or sclerotic osseous lesions are identified.
IMPRESSION: 1. No pulmonary embolus identified.
2. There are multiple tiny nodular densities, including centrilobular
nodules, in the right upper lobe and to a lesser extent the right middle
lobe. Differential would include infection, such as atypical infection.
Followup noncontrast chest CT is recommended in 3 months to ensure
resolution.
3. There are several small, 3-4 mm nodules in the left lung. Recommend
attention on the aforementioned followup chest CT.

## 2013-02-06 ENCOUNTER — Ambulatory Visit: Payer: Self-pay

## 2013-02-06 LAB — COMPREHENSIVE METABOLIC PANEL
Anion Gap: 12 (ref 7–16)
BUN: 11 mg/dL (ref 7–18)
Creatinine: 0.88 mg/dL (ref 0.60–1.30)
EGFR (African American): >60
Glucose: 115 mg/dL — ABNORMAL HIGH (ref 65–99)
SGPT (ALT): 1550 U/L — ABNORMAL HIGH (ref 12–78)
Sodium: 137 mmol/L (ref 136–145)
Total Protein: 8 g/dL (ref 6.4–8.2)

## 2013-02-06 LAB — CBC WITH DIFFERENTIAL/PLATELET
Basophil %: 0.5 %
Eosinophil %: 0.1 %
HCT: 45 % (ref 35.0–47.0)
Lymphocyte #: 0.7 10*3/uL — ABNORMAL LOW (ref 1.0–3.6)
Lymphocyte %: 5.7 %
MCH: 30.2 pg (ref 26.0–34.0)
Neutrophil #: 10.7 10*3/uL — ABNORMAL HIGH (ref 1.4–6.5)
Neutrophil %: 88.4 %
RBC: 4.99 10*6/uL (ref 3.80–5.20)
RDW: 13.2 % (ref 11.5–14.5)
WBC: 12.2 10*3/uL — ABNORMAL HIGH (ref 3.6–11.0)

## 2013-02-06 LAB — URINALYSIS, COMPLETE
Glucose,UR: NEGATIVE mg/dL (ref 0–75)
Ketone: NEGATIVE
Ph: 7.5 (ref 4.5–8.0)
Specific Gravity: 1.01 (ref 1.003–1.030)
WBC UR: >30 /HPF (ref 0–5)

## 2013-02-07 LAB — URINE CULTURE

## 2013-05-18 ENCOUNTER — Ambulatory Visit: Payer: Self-pay | Admitting: Emergency Medicine

## 2013-05-18 ENCOUNTER — Emergency Department: Payer: Self-pay | Admitting: Emergency Medicine

## 2013-05-18 LAB — COMPREHENSIVE METABOLIC PANEL
Albumin: 3.6 g/dL (ref 3.4–5.0)
Alkaline Phosphatase: 384 U/L — ABNORMAL HIGH (ref 50–136)
Anion Gap: 12 (ref 7–16)
BUN: 14 mg/dL (ref 7–18)
Bilirubin,Total: 6.7 mg/dL — ABNORMAL HIGH (ref 0.2–1.0)
Calcium, Total: 9.2 mg/dL (ref 8.5–10.1)
Co2: 27 mmol/L (ref 21–32)
EGFR (Non-African Amer.): >60
Osmolality: 277 (ref 275–301)
Potassium: 3.4 mmol/L — ABNORMAL LOW (ref 3.5–5.1)
SGPT (ALT): 1340 U/L — ABNORMAL HIGH (ref 12–78)
Sodium: 138 mmol/L (ref 136–145)
Total Protein: 7.7 g/dL (ref 6.4–8.2)

## 2013-05-18 LAB — RAPID INFLUENZA A&B ANTIGENS

## 2013-05-18 LAB — PROTIME-INR: INR: 1.2

## 2013-05-18 LAB — CBC WITH DIFFERENTIAL/PLATELET
Basophil #: 0 10*3/uL (ref 0.0–0.1)
HGB: 14.5 g/dL (ref 12.0–16.0)
Lymphocyte %: 8.9 %
MCH: 29.6 pg (ref 26.0–34.0)
MCHC: 32.8 g/dL (ref 32.0–36.0)
MCV: 90 fL (ref 80–100)
Monocyte #: 1.4 x10 3/mm — ABNORMAL HIGH (ref 0.2–0.9)
Neutrophil %: 80 %
RDW: 13.5 % (ref 11.5–14.5)

## 2013-05-18 LAB — LIPASE, BLOOD: Lipase: 65 U/L — ABNORMAL LOW (ref 73–393)

## 2013-05-18 LAB — OCCULT BLOOD X 1 CARD TO LAB, STOOL: Occult Blood, Feces: POSITIVE

## 2013-05-18 LAB — AMYLASE: Amylase: 17 U/L — ABNORMAL LOW (ref 25–115)

## 2013-05-18 IMAGING — US ABDOMEN ULTRASOUND LIMITED
1 series · 14 of 25 positions shown · non-contrast
Comparison: none

REASON FOR EXAM: vomiting and elevated liver enzymes
COMMENTS:   Body Site: GB and Fossa, CBD, Head of Pancreas

[Series 1: abdomen ultrasound limited · 0.26mm/px · 14 of 51 slices shown]
[im 1/51]
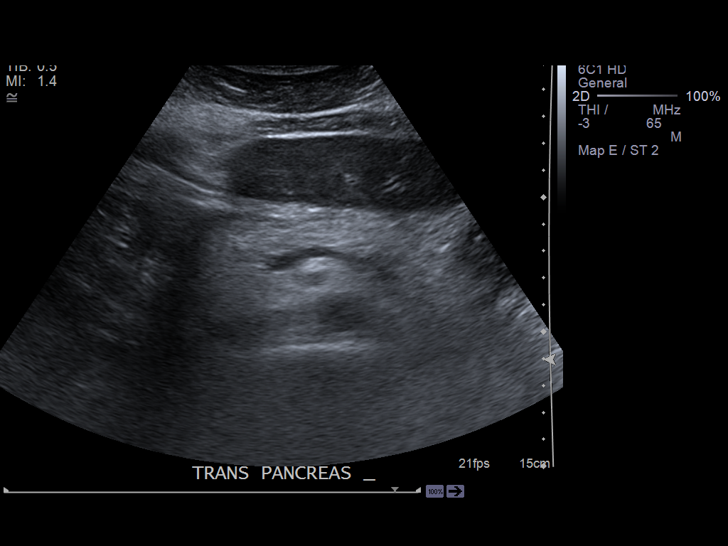
[im 5/51]
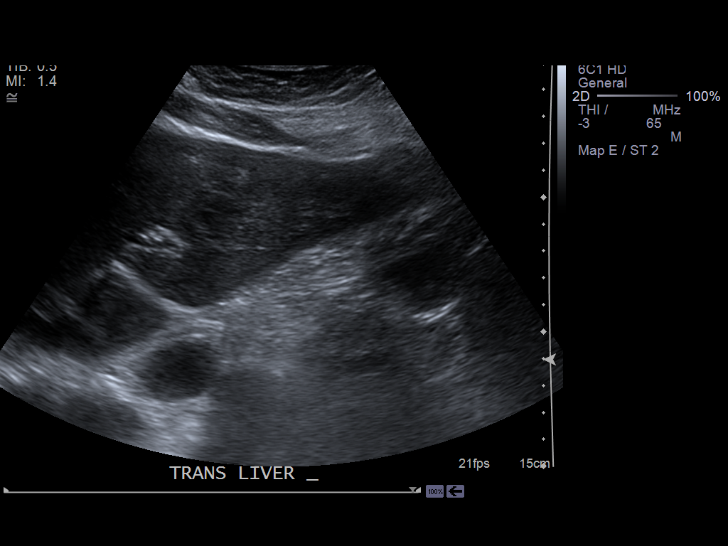
[im 9/51]
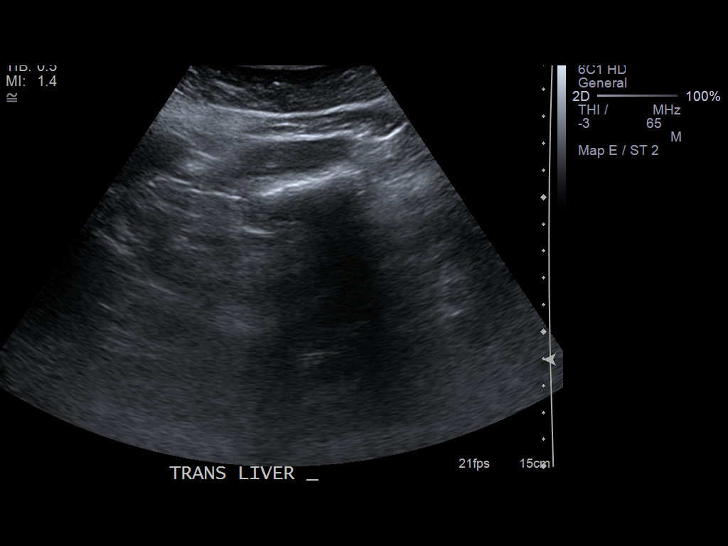
[im 13/51]
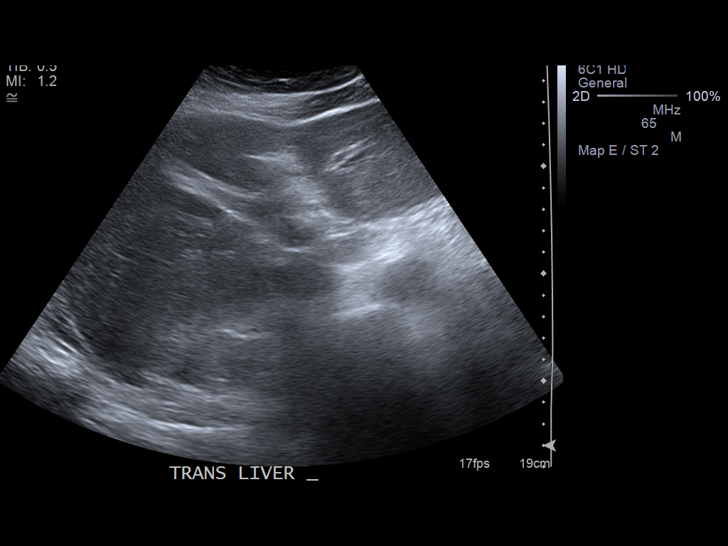
[im 17/51]
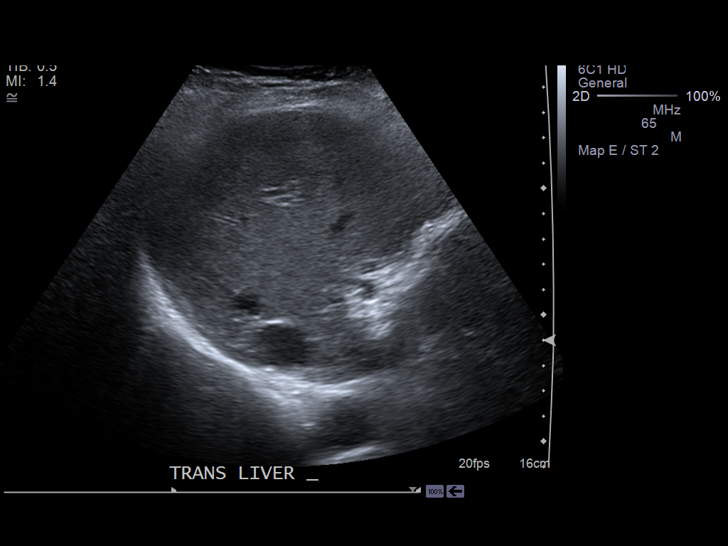
[im 19/51]
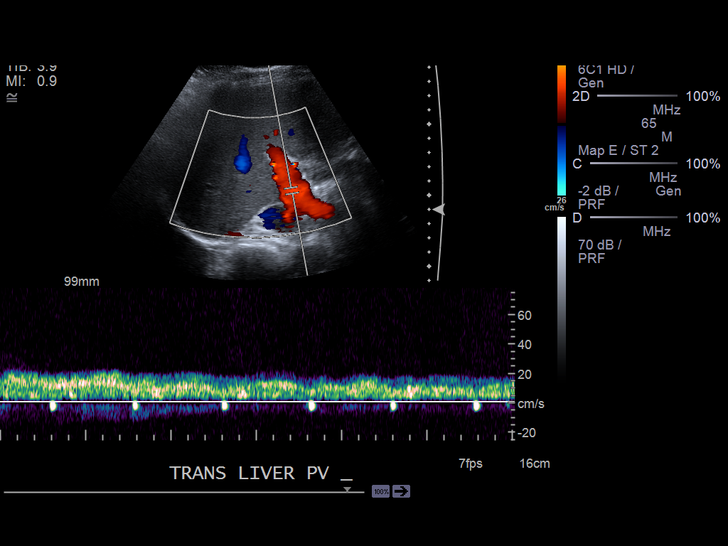
[im 23/51]
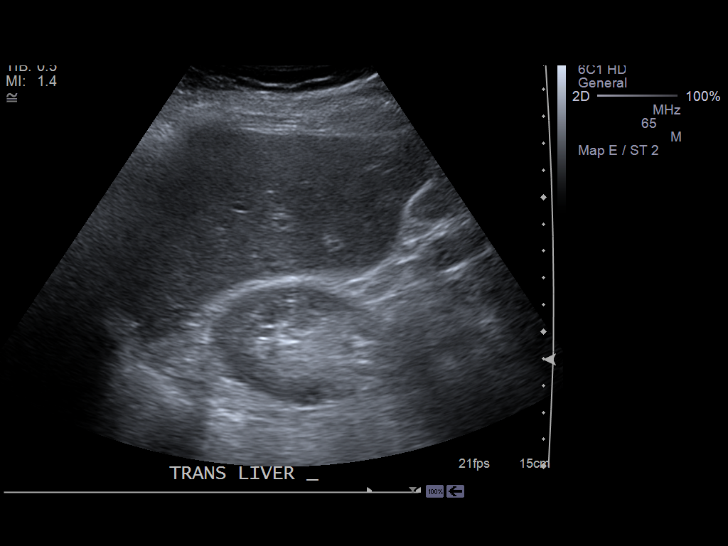
[im 28/51]
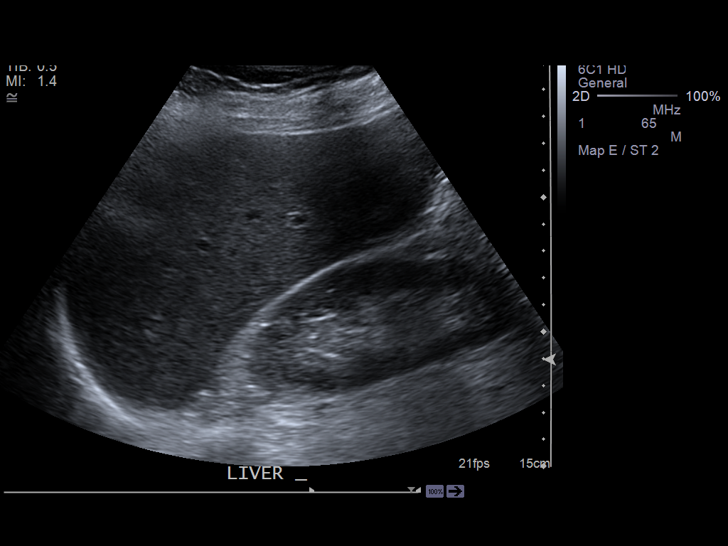
[im 32/51]
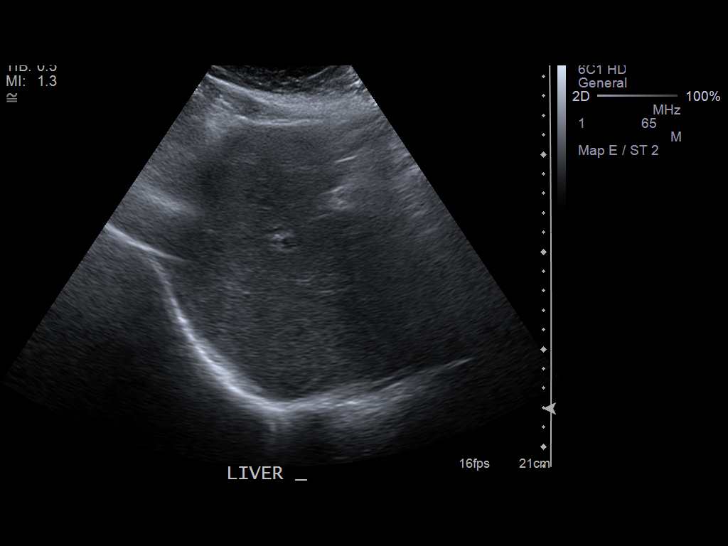
[im 34/51]
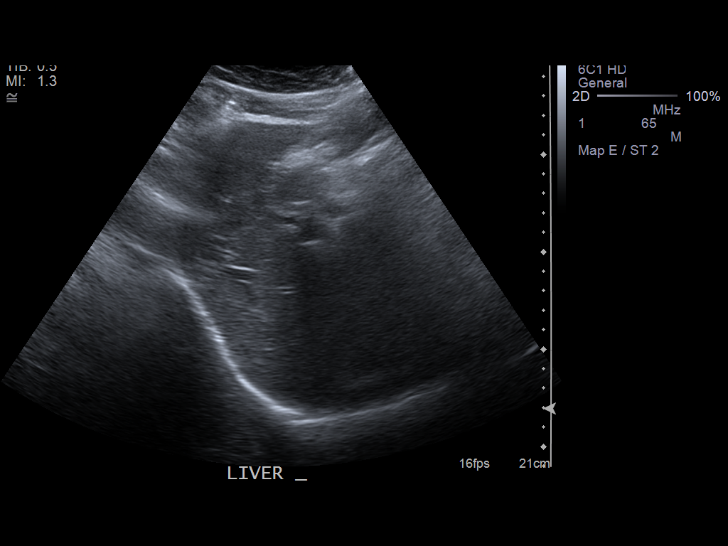
[im 38/51]
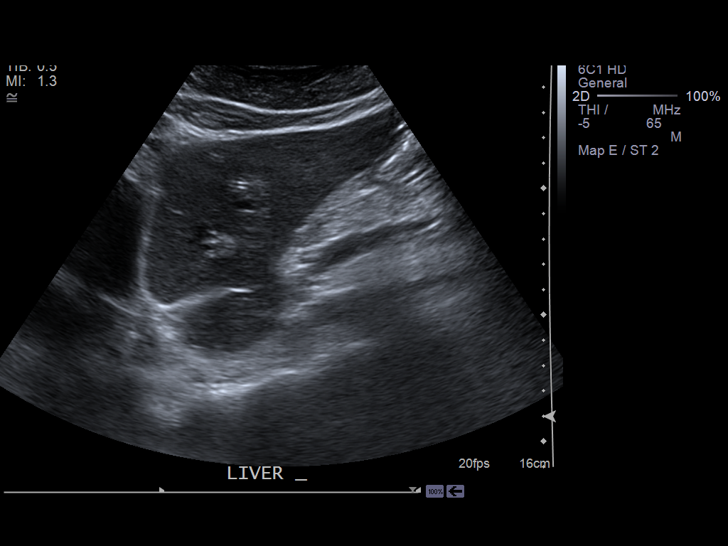
[im 42/51]
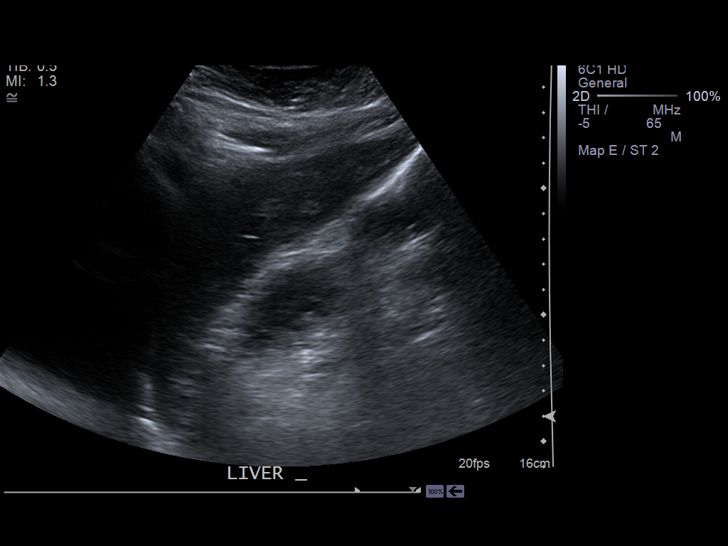
[im 46/51]
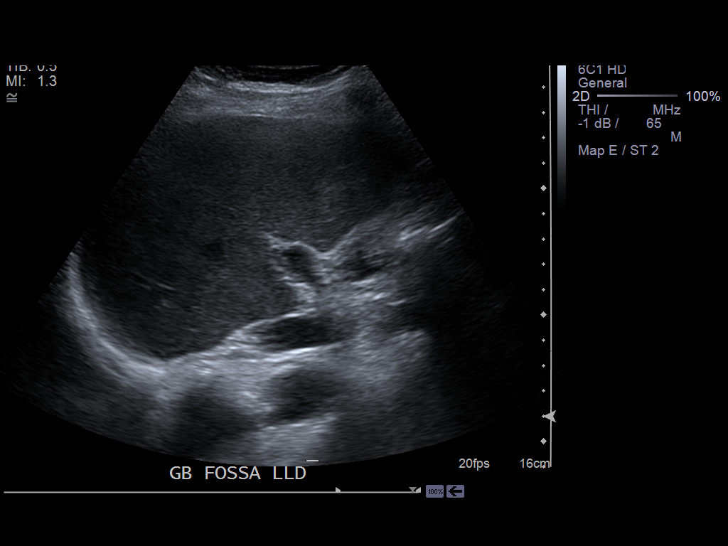
[im 51/51]
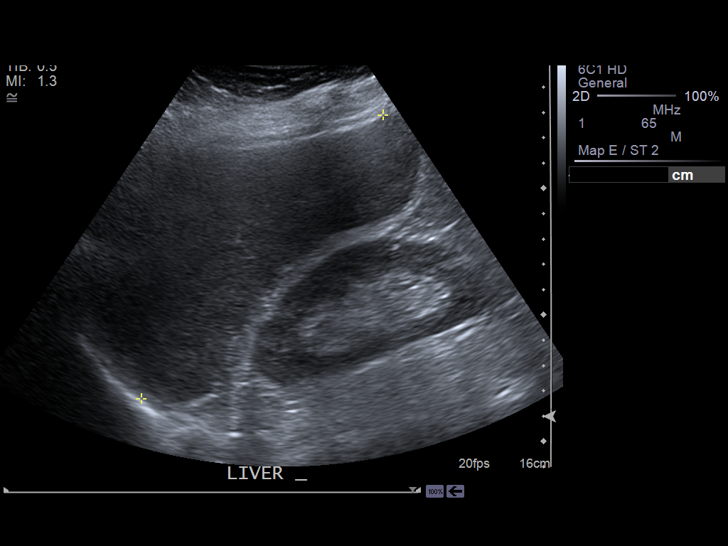

[14 of 25 positions shown; findings below may reference images not displayed]

PROCEDURE:     US  - US ABDOMEN LIMITED SURVEY  - [DATE] [DATE]

RESULT:     The visualized pancreas appears unremarkable. Images of the
liver demonstrate a normal appearing echotexture. The portal venous flow
appears normal. The included right kidney is unremarkable. The common bile
duct diameter is 3.8 to 4.6 mm. Liver length is 14.73 cm. The patient is
status post cholecystectomy.
IMPRESSION: 1. Limited right upper quadrant abdominal ultrasound without acute
abnormality. The patient is status post cholecystectomy. The common bile
duct measures up to 4.6 mm. No hepatomegaly or focal hepatic abnormality
evident. The visualized pancreas is unremarkable.

[REDACTED]

## 2013-05-18 IMAGING — CT CT ABD-PELV W/ CM
1 of 3 series · 14 of 32 positions shown, 19 images · non-contrast
Comparison: none

REASON FOR EXAM: (1) gi bleed; (2) gi bleed
COMMENTS:

PROCEDURE:     CT  - CT ABDOMEN / PELVIS  W  - [DATE]  [DATE]
RESULT:     History: GI bleed.
Comparison Study: Prior CT of [DATE].

[Series 2: 3mm soft tissue · axial · 0.68mm/px · z∈[-890,-492]mm · 14 of 153 slices shown, 19 images]
[im 10/153  soft-tissue]
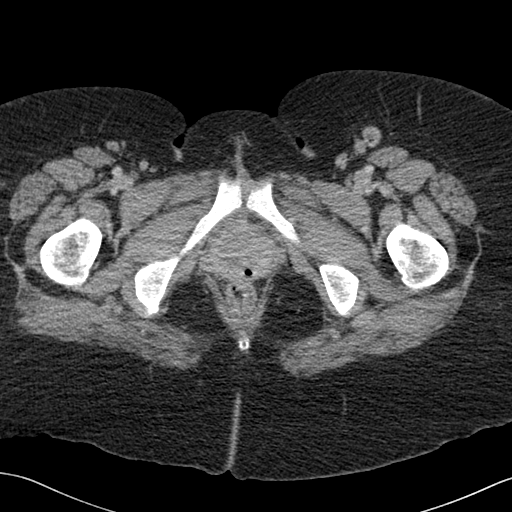
[im 10/153  bone]
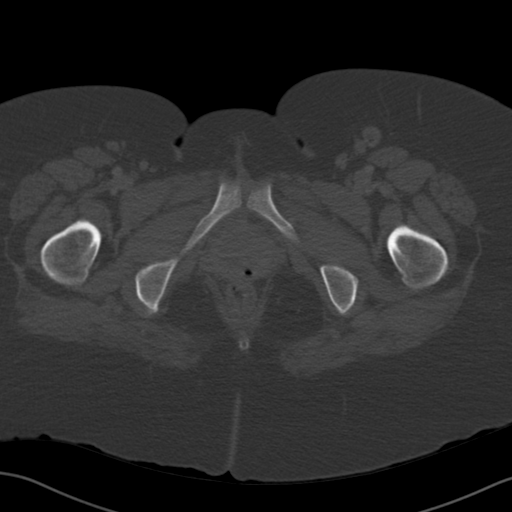
[im 20/153  soft-tissue]
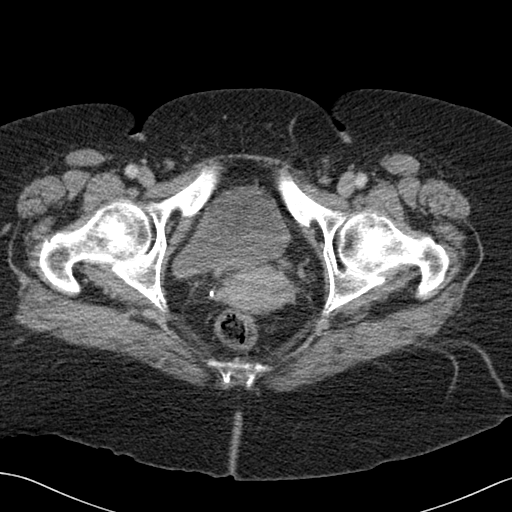
[im 29/153  soft-tissue]
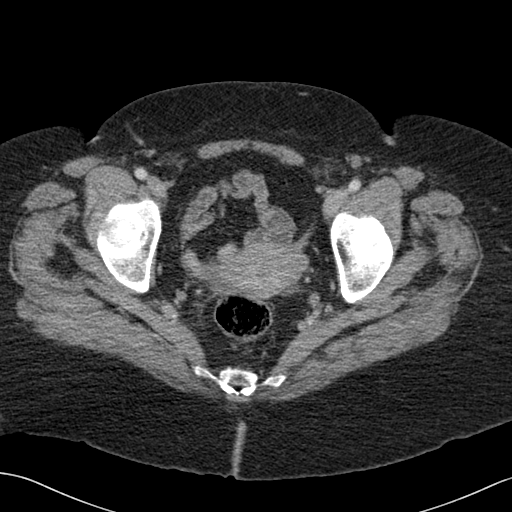
[im 48/153  soft-tissue]
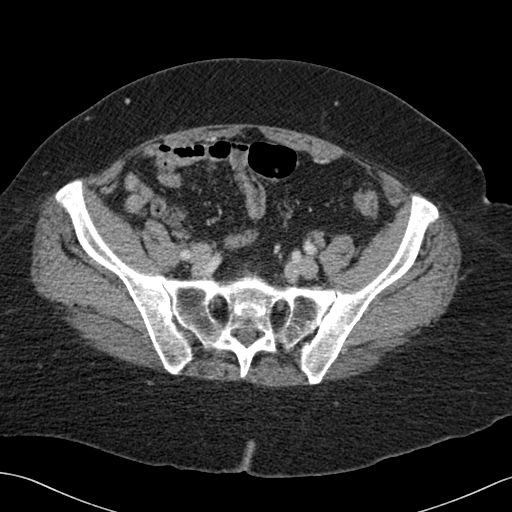
[im 58/153  soft-tissue]
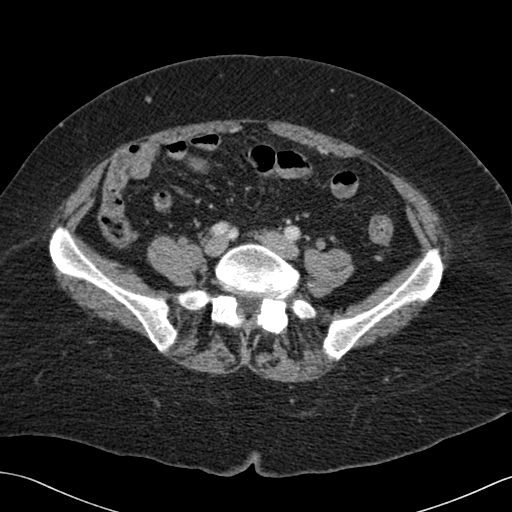
[im 67/153  soft-tissue]
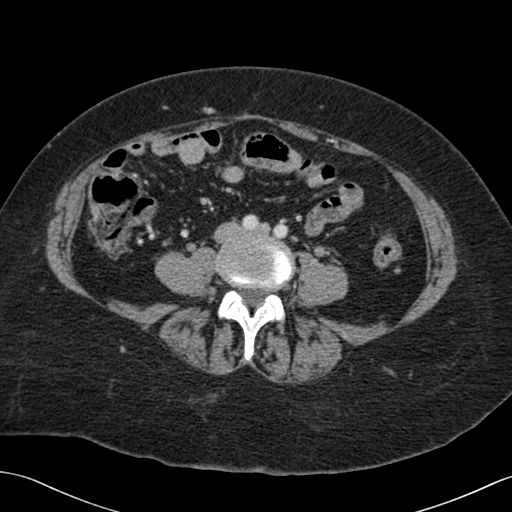
[im 77/153  soft-tissue]
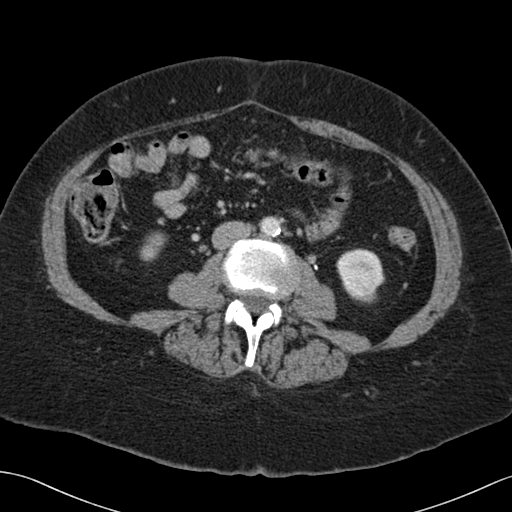
[im 86/153  soft-tissue]
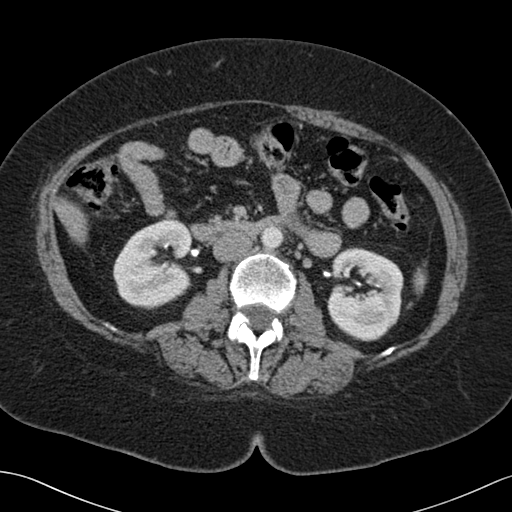
[im 96/153  soft-tissue]
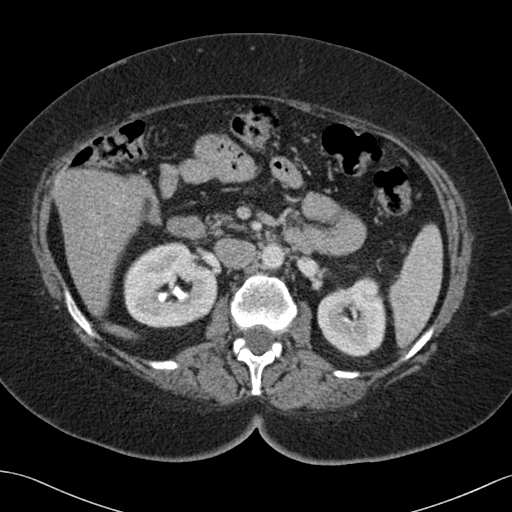
[im 96/153  bone]
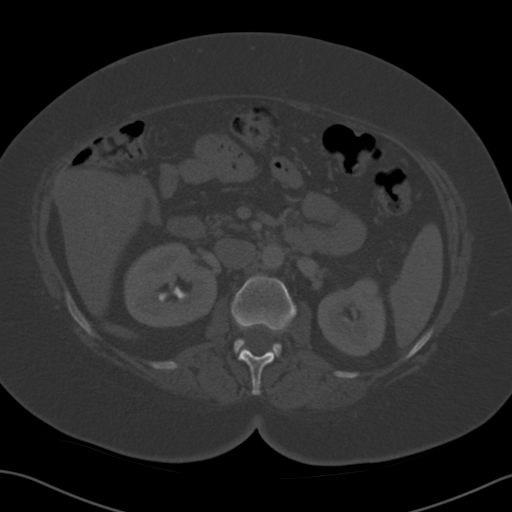
[im 105/153  soft-tissue]
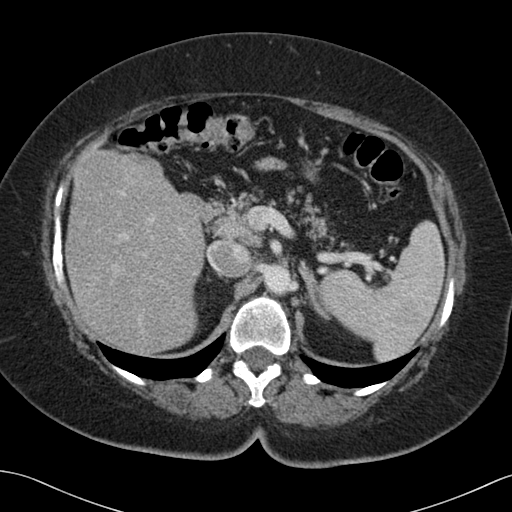
[im 115/153  lung]
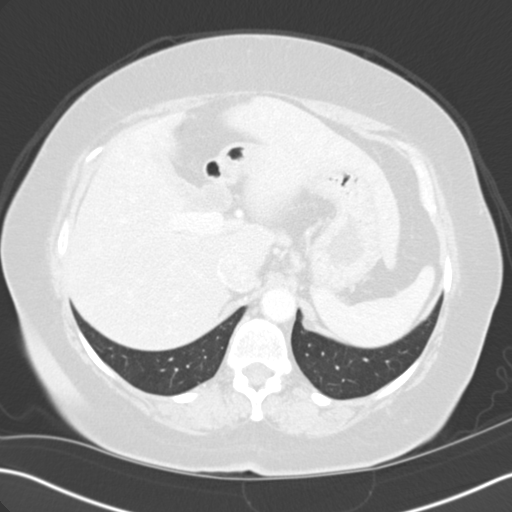
[im 124/153  soft-tissue]
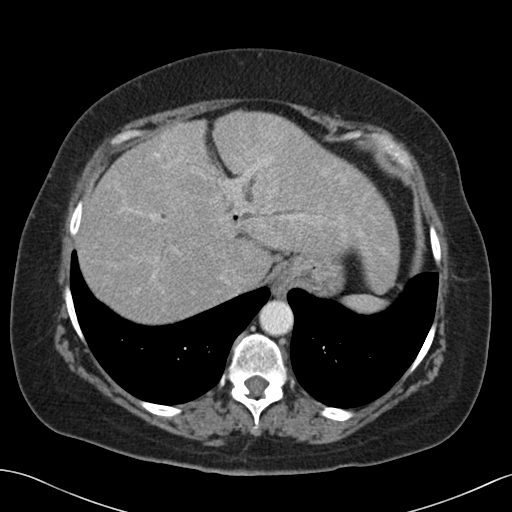
[im 124/153  lung]
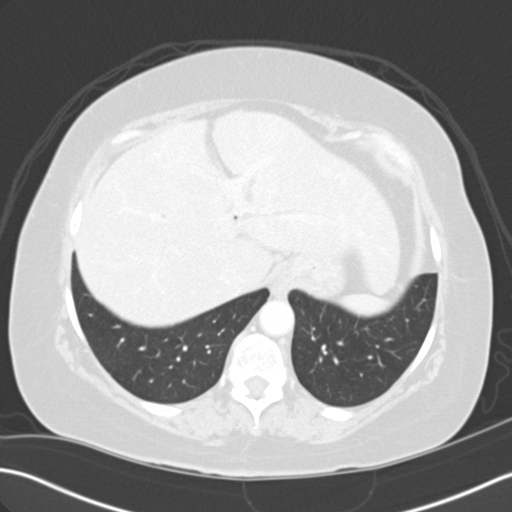
[im 134/153  soft-tissue]
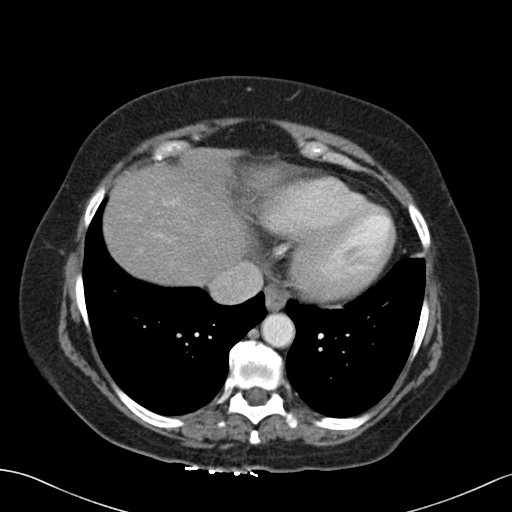
[im 134/153  lung]
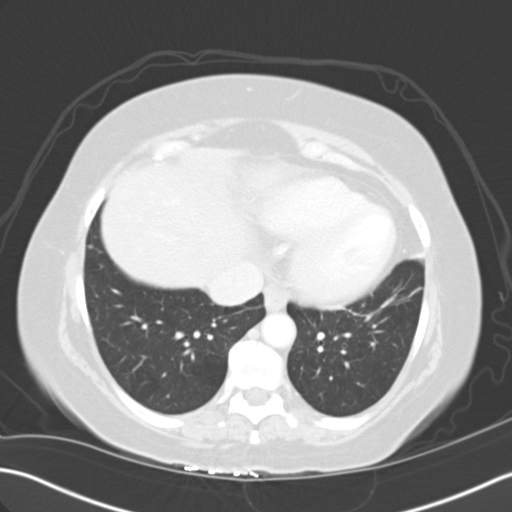
[im 143/153  soft-tissue]
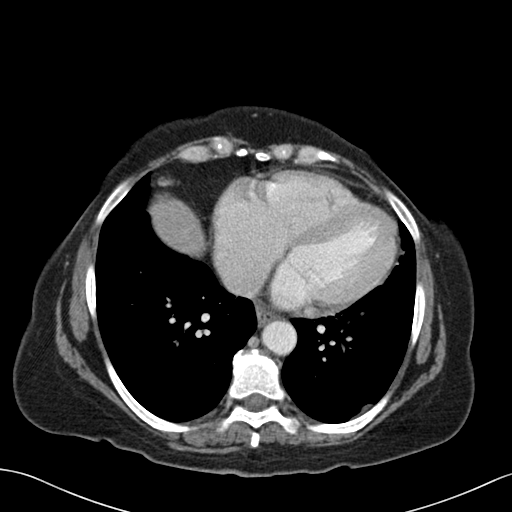
[im 143/153  lung]
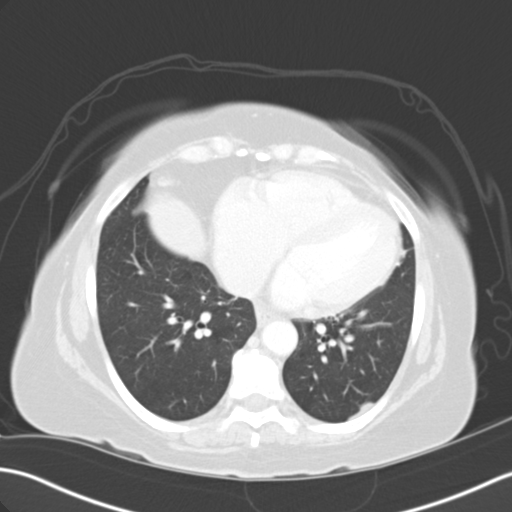

[14 of 32 positions shown; findings below may reference images not displayed]

FINDINGS: Standard CT obtained with 100 cc of [XS]. No focal hepatic
abnormality. Postsurgical changes in the right upper quadrant. Air in the
biliary system is again noted most consistent with prior biliary surgery.
Pancreas is normal. Spleen is normal. Portal vein and splenic vein are
patent. Gastric and perigastric regions are normal. Adrenals are normal.
Simple cyst right kidney otherwise no focal renal abnormality. Bladder is
nondistended. Uterus and adnexa normal. Phleboliths. No bowel distention or
free air. No inflammatory change in right or left lower quadrant. Appendix
normal. Splenosis. A multiple stable inguinal lymph nodes are noted.
Scarring in lung bases.
IMPRESSION: Findings consistent right upper quadrant /biliary surgery.
No acute abnormality identified. Specifically there is no evidence of bowel
distention or free air.

## 2013-06-15 ENCOUNTER — Ambulatory Visit: Payer: Self-pay | Admitting: Unknown Physician Specialty

## 2013-06-17 LAB — PATHOLOGY REPORT

## 2013-09-23 ENCOUNTER — Ambulatory Visit: Payer: Self-pay | Admitting: Emergency Medicine

## 2013-09-23 LAB — URINALYSIS, COMPLETE
Glucose,UR: NEGATIVE mg/dL (ref 0–75)
Nitrite: NEGATIVE
Protein: NEGATIVE
RBC,UR: NONE SEEN /HPF (ref 0–5)

## 2013-09-23 LAB — WET PREP, GENITAL

## 2013-12-03 ENCOUNTER — Ambulatory Visit: Payer: Self-pay | Admitting: Physician Assistant

## 2014-04-24 ENCOUNTER — Ambulatory Visit: Payer: Self-pay | Admitting: Emergency Medicine

## 2014-04-24 LAB — URINALYSIS, COMPLETE
Bilirubin,UR: NEGATIVE
Glucose,UR: NEGATIVE mg/dL (ref 0–75)
Ketone: NEGATIVE
LEUKOCYTE ESTERASE: NEGATIVE
Nitrite: NEGATIVE
PH: 6 (ref 4.5–8.0)
Specific Gravity: >=1.03 (ref 1.003–1.030)

## 2014-04-24 LAB — WET PREP, GENITAL

## 2014-04-24 LAB — GC/CHLAMYDIA PROBE AMP

## 2014-04-26 LAB — URINE CULTURE

## 2014-07-12 ENCOUNTER — Encounter: Payer: Self-pay | Admitting: Surgery

## 2014-07-23 IMAGING — US US EXTREM LOW VENOUS*L*
1 series · 13 of 24 positions shown · non-contrast
Comparison: None

CLINICAL DATA: LEFT leg swelling for 2 weeks



[Series 1: us extrem low venous*left* · 0.07mm/px · 13 of 57 slices shown]
[im 1/57]
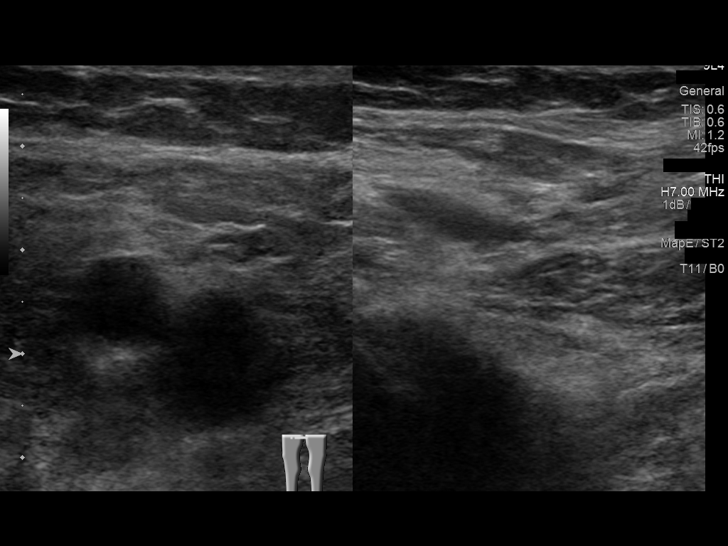
[im 5/57]
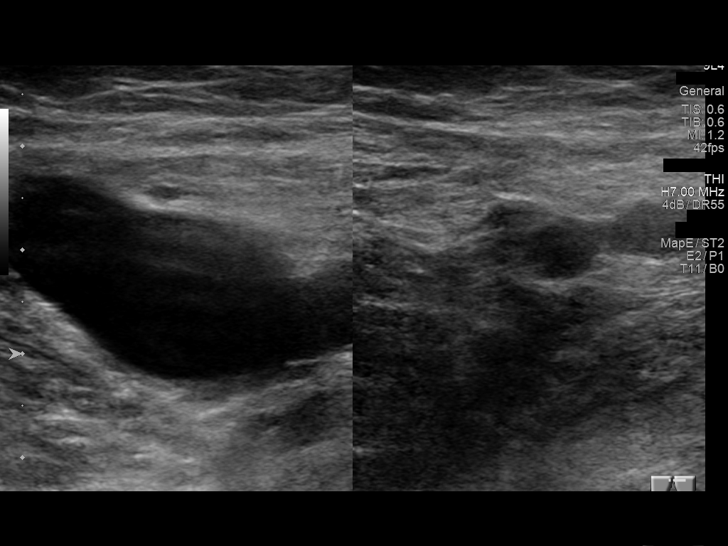
[im 10/57]
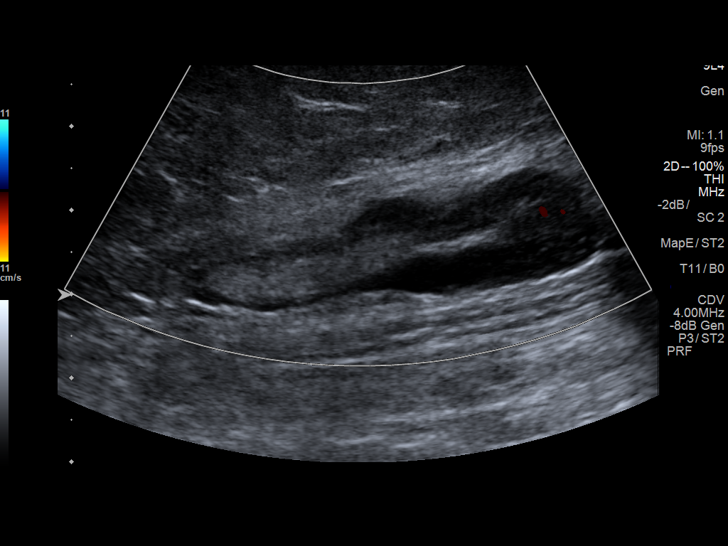
[im 15/57]
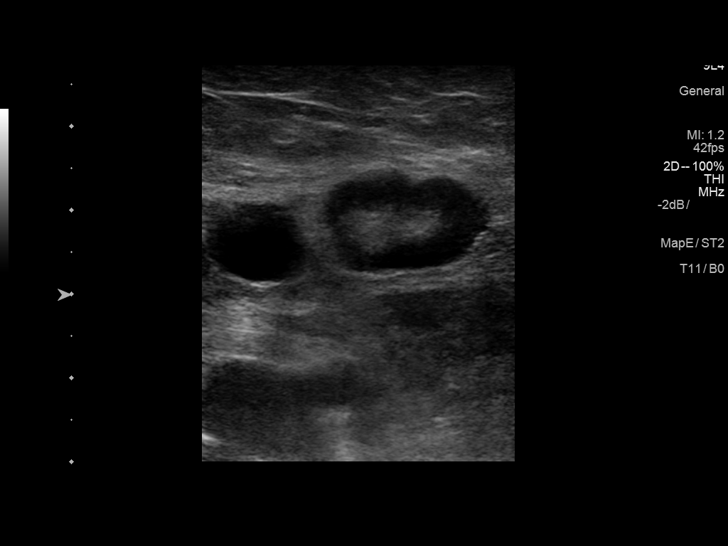
[im 20/57]
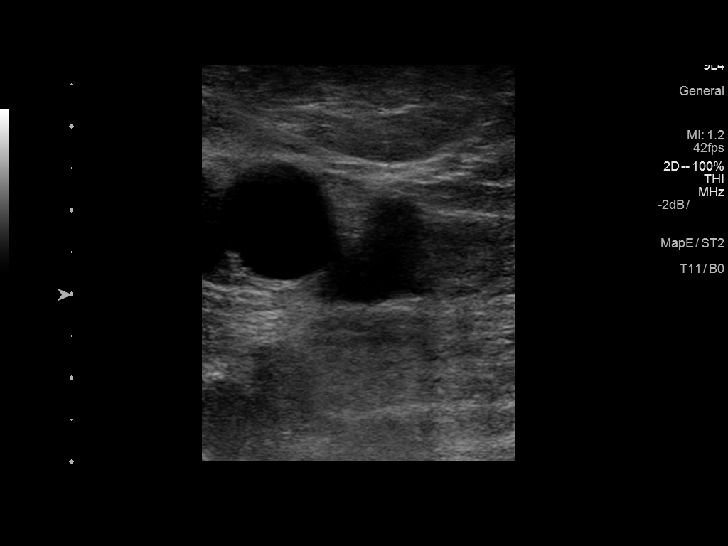
[im 25/57]
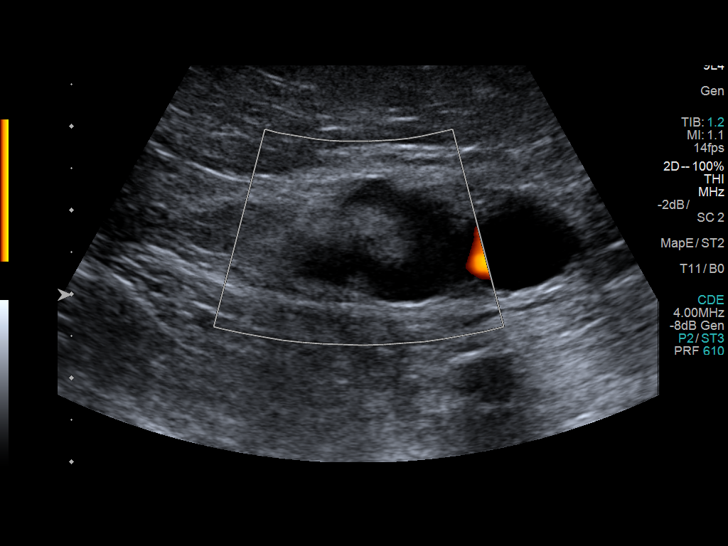
[im 30/57]
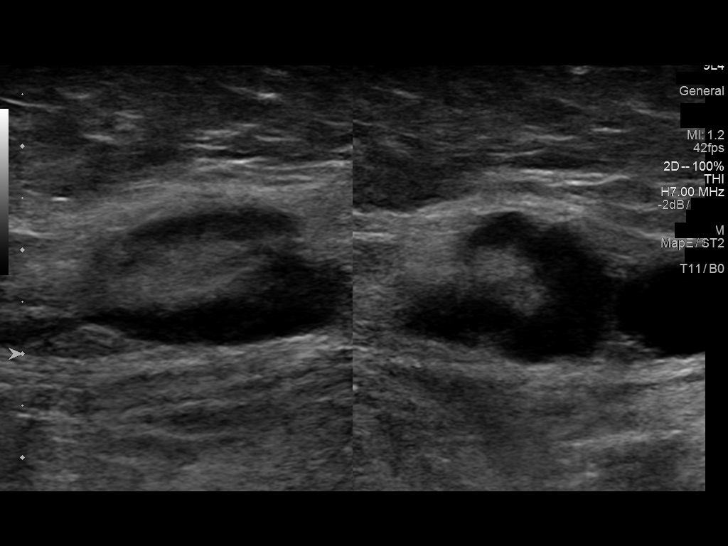
[im 32/57]
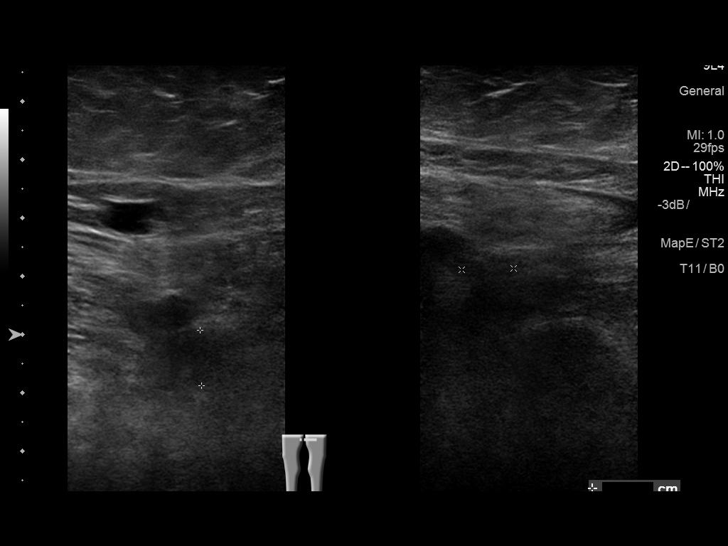
[im 37/57]
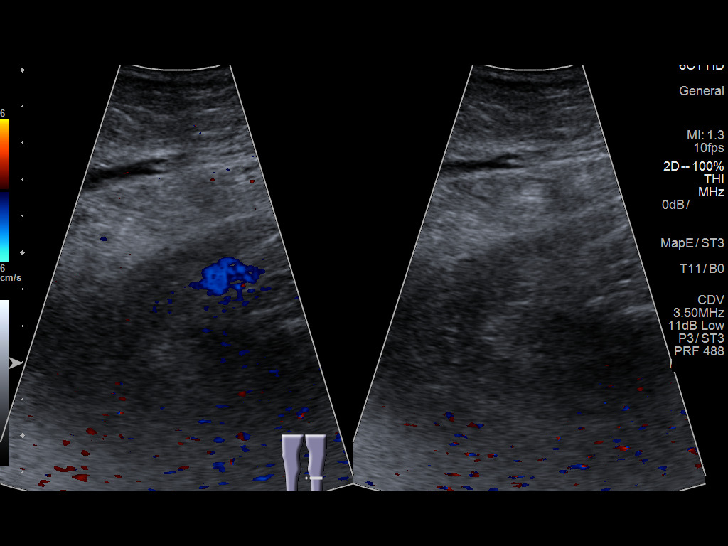
[im 42/57]
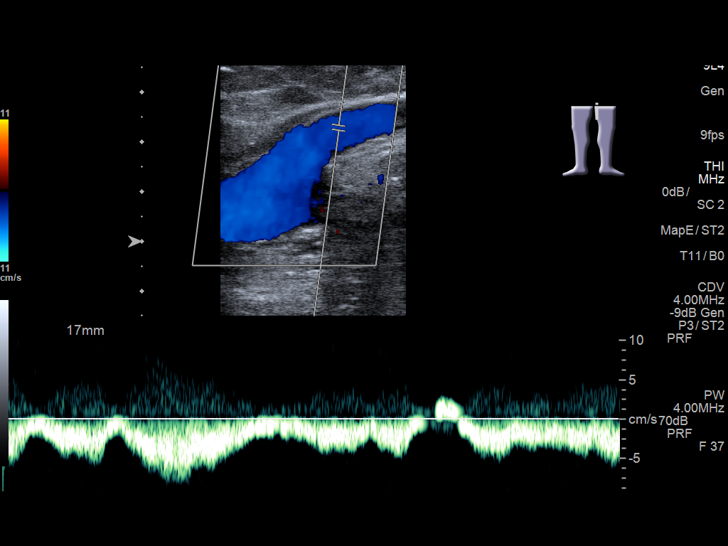
[im 47/57]
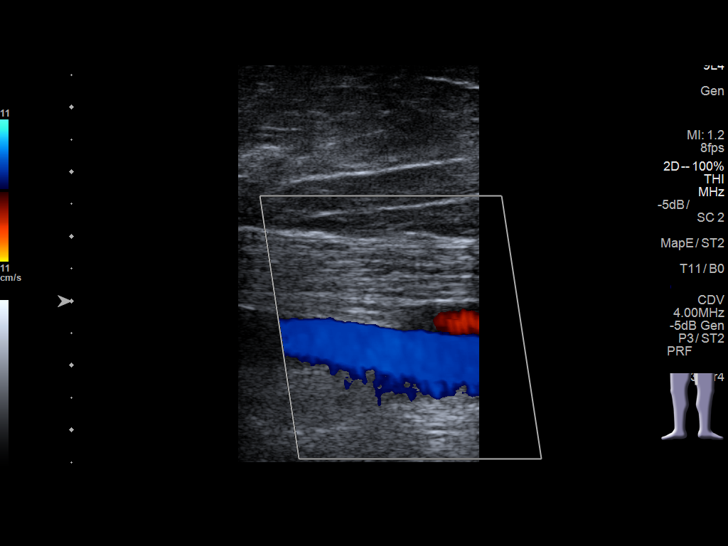
[im 52/57]
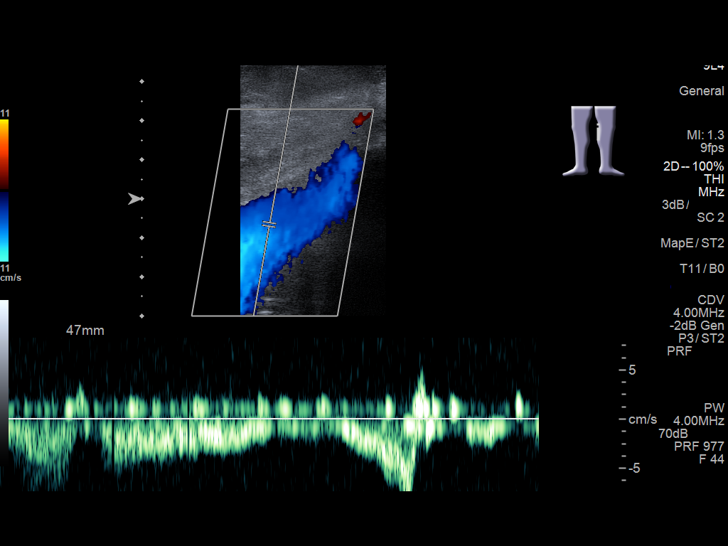
[im 57/57]
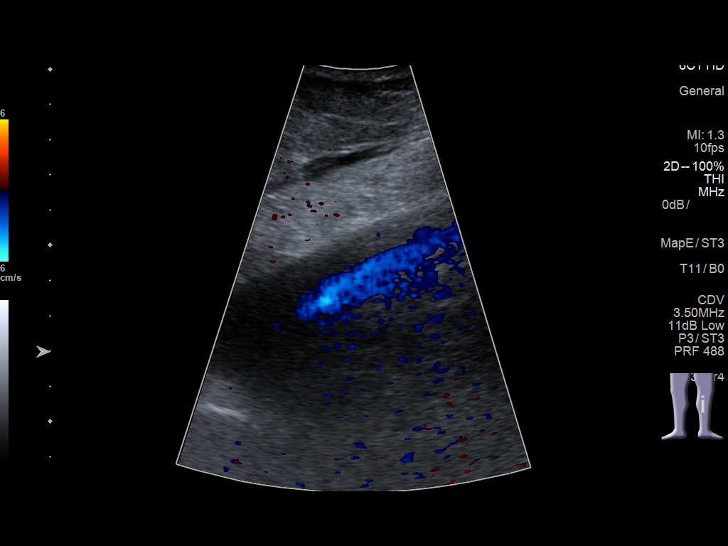

[13 of 24 positions shown; findings below may reference images not displayed]

FINDINGS: Contralateral Common Femoral Vein: Respiratory phasicity is normal
and symmetric with the symptomatic side. No evidence of thrombus.
Normal compressibility.

Common Femoral Vein: No evidence of thrombus. Normal
compressibility, respiratory phasicity and response to augmentation.

Saphenofemoral Junction: No evidence of thrombus. Normal
compressibility and flow on color Doppler imaging.

Profunda Femoral Vein: No evidence of thrombus. Normal
compressibility and flow on color Doppler imaging.

Femoral Vein: No evidence of thrombus. Normal compressibility,
respiratory phasicity and response to augmentation.

Popliteal Vein: No evidence of thrombus. Normal compressibility,
respiratory phasicity and response to augmentation.

Calf Veins: Not well visualized due to soft tissue swelling.
Visualized portions of the calf veins are grossly patent.

Superficial Great Saphenous Vein: No evidence of thrombus. Normal
compressibility and flow on color Doppler imaging.

Venous Reflux:  None.

Other Findings: Incidentally noted normal size lymph nodes at the
LEFT inguinal region.
IMPRESSION: No evidence of deep venous thrombosis in the LEFT lower extremity.

## 2014-07-24 ENCOUNTER — Encounter: Payer: Self-pay | Admitting: Surgery

## 2014-08-09 ENCOUNTER — Other Ambulatory Visit: Payer: Self-pay | Admitting: Surgery

## 2014-08-09 LAB — HEMOGLOBIN A1C: HEMOGLOBIN A1C: 5.8 % (ref 4.2–6.3)

## 2014-08-24 ENCOUNTER — Encounter: Payer: Self-pay | Admitting: Surgery

## 2014-12-11 ENCOUNTER — Emergency Department: Payer: Self-pay | Admitting: Emergency Medicine

## 2014-12-11 LAB — COMPREHENSIVE METABOLIC PANEL
ALK PHOS: 321 U/L — AB
ALT: 451 U/L — AB
Albumin: 3.3 g/dL — ABNORMAL LOW (ref 3.4–5.0)
Anion Gap: 8 (ref 7–16)
BUN: 6 mg/dL — ABNORMAL LOW (ref 7–18)
Bilirubin,Total: 1.1 mg/dL — ABNORMAL HIGH (ref 0.2–1.0)
CO2: 29 mmol/L (ref 21–32)
Calcium, Total: 8.8 mg/dL (ref 8.5–10.1)
Chloride: 103 mmol/L (ref 98–107)
Creatinine: 0.76 mg/dL (ref 0.60–1.30)
Glucose: 90 mg/dL (ref 65–99)
Osmolality: 277 (ref 275–301)
POTASSIUM: 3.2 mmol/L — AB (ref 3.5–5.1)
SGOT(AST): 105 U/L — ABNORMAL HIGH (ref 15–37)
Sodium: 140 mmol/L (ref 136–145)
Total Protein: 7.7 g/dL (ref 6.4–8.2)

## 2014-12-11 LAB — CBC WITH DIFFERENTIAL/PLATELET
BASOS PCT: 0.2 %
Basophil #: 0 10*3/uL (ref 0.0–0.1)
EOS PCT: 0.4 %
Eosinophil #: 0 10*3/uL (ref 0.0–0.7)
HCT: 46 % (ref 35.0–47.0)
HGB: 15 g/dL (ref 12.0–16.0)
Lymphocyte #: 1.7 10*3/uL (ref 1.0–3.6)
Lymphocyte %: 16.5 %
MCH: 30.6 pg (ref 26.0–34.0)
MCHC: 32.6 g/dL (ref 32.0–36.0)
MCV: 94 fL (ref 80–100)
MONO ABS: 1.2 x10 3/mm — AB (ref 0.2–0.9)
Monocyte %: 11.2 %
Neutrophil #: 7.5 10*3/uL — ABNORMAL HIGH (ref 1.4–6.5)
Neutrophil %: 71.7 %
Platelet: 244 10*3/uL (ref 150–440)
RBC: 4.9 10*6/uL (ref 3.80–5.20)
RDW: 12.9 % (ref 11.5–14.5)
WBC: 10.4 10*3/uL (ref 3.6–11.0)

## 2014-12-11 LAB — LIPASE, BLOOD: LIPASE: 51 U/L — AB (ref 73–393)

## 2014-12-11 LAB — TROPONIN I

## 2014-12-11 LAB — URINALYSIS, COMPLETE
Bilirubin,UR: NEGATIVE
GLUCOSE, UR: NEGATIVE mg/dL (ref 0–75)
KETONE: NEGATIVE
Nitrite: NEGATIVE
Ph: 5 (ref 4.5–8.0)
Protein: 30
Specific Gravity: 1.02 (ref 1.003–1.030)
WBC UR: 13 /HPF (ref 0–5)

## 2014-12-11 IMAGING — US ABDOMEN ULTRASOUND LIMITED
1 series · 14 of 25 positions shown · non-contrast
Comparison: [DATE]

CLINICAL DATA: Nausea and vomiting with elevated liver enzymes

EXAM:
US ABDOMEN LIMITED - RIGHT UPPER QUADRANT

[Series 1: abdomen ultrasound limited · 0.25mm/px · 14 of 43 slices shown]
[im 1/43]
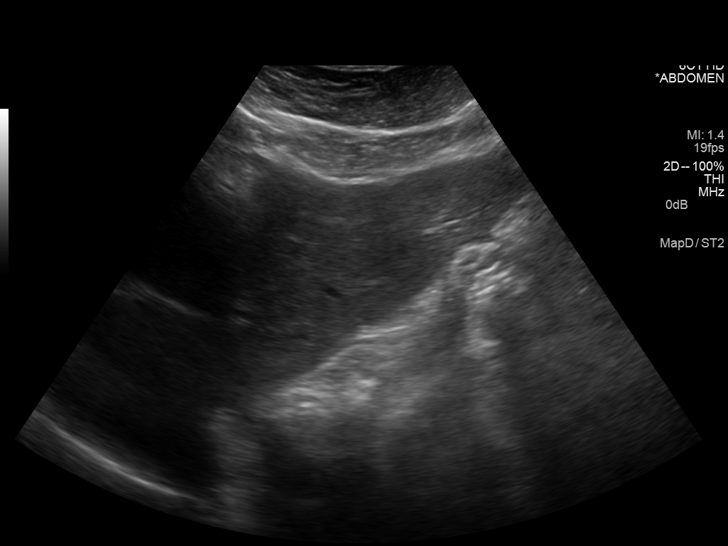
[im 4/43]
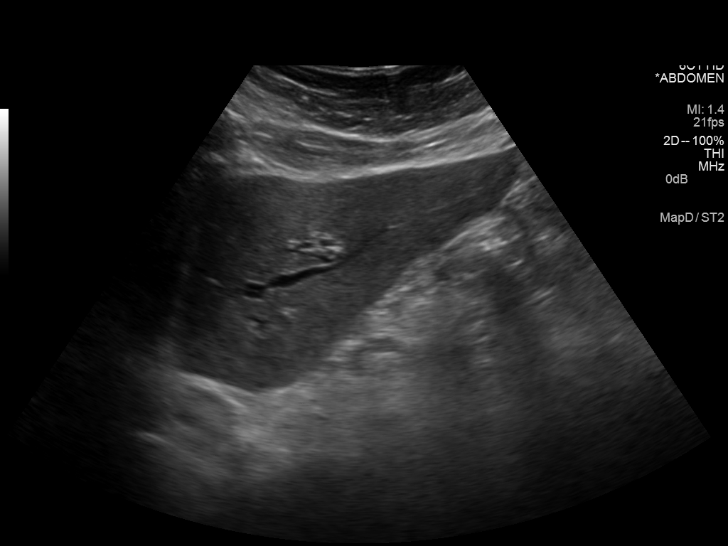
[im 8/43]
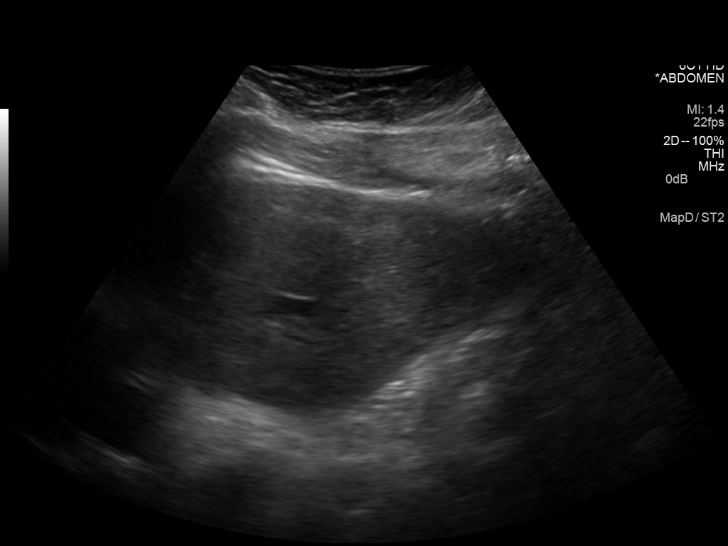
[im 11/43]
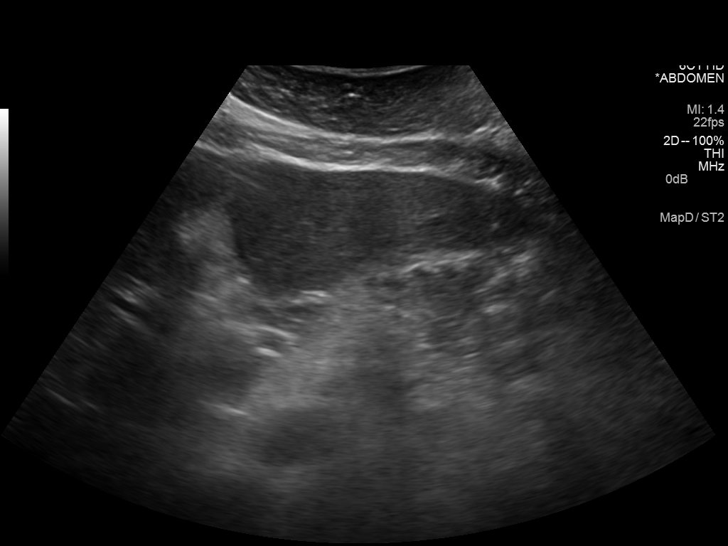
[im 15/43]
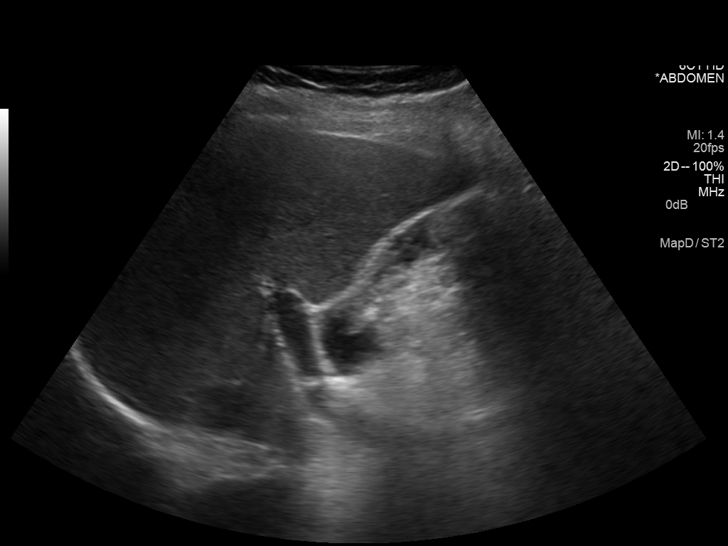
[im 16/43]
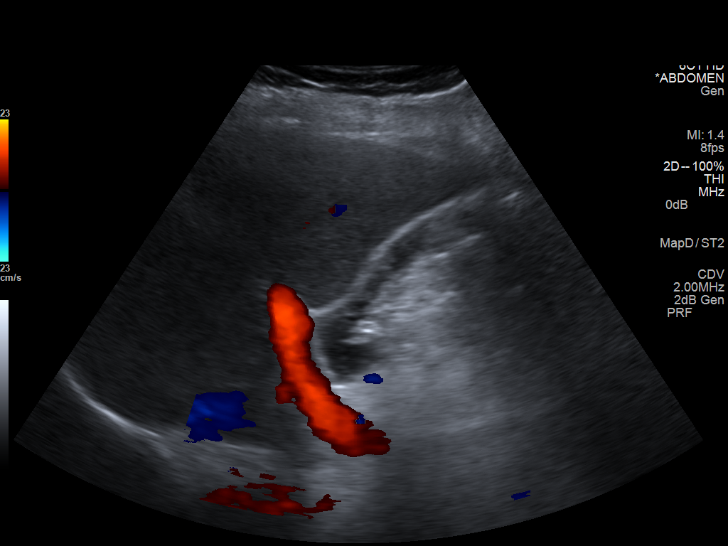
[im 20/43]
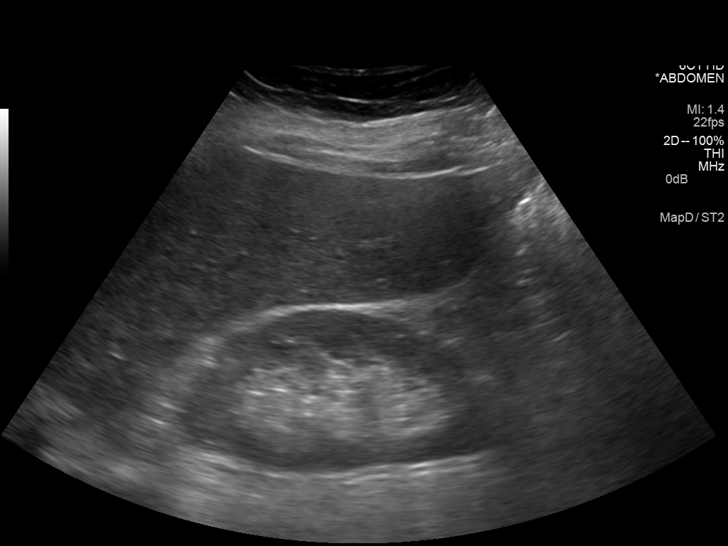
[im 23/43]
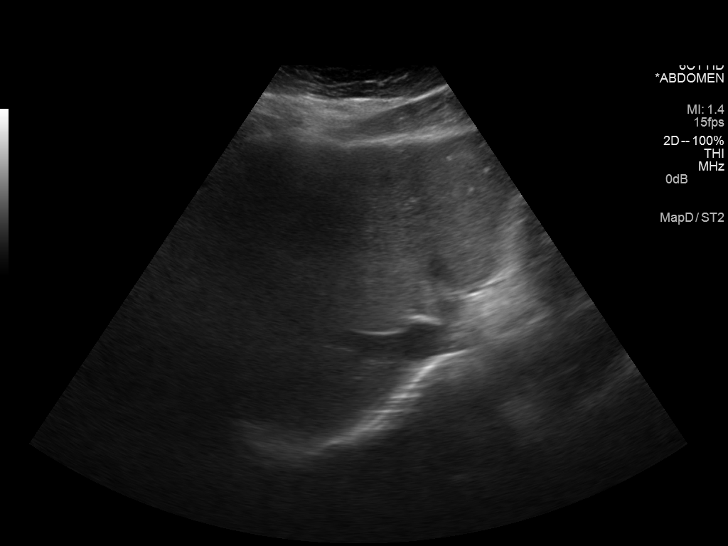
[im 27/43]
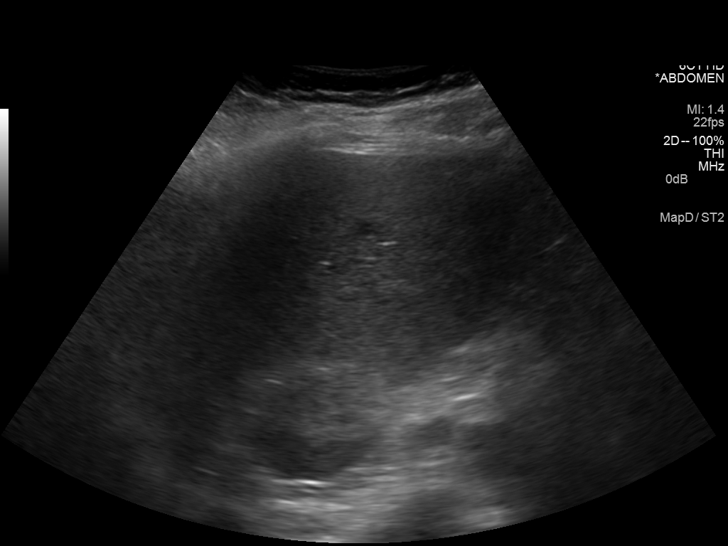
[im 29/43]
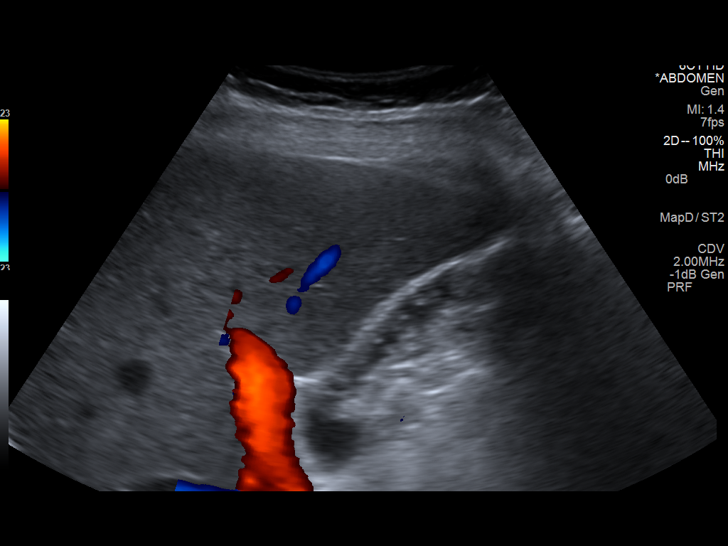
[im 32/43]
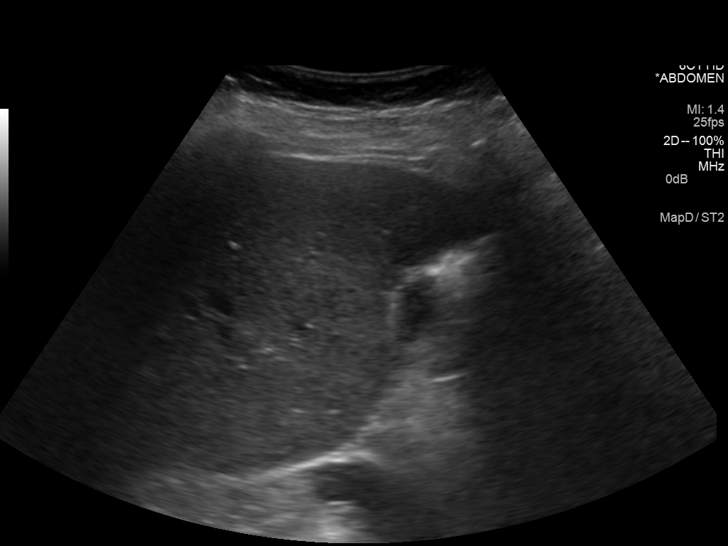
[im 36/43]
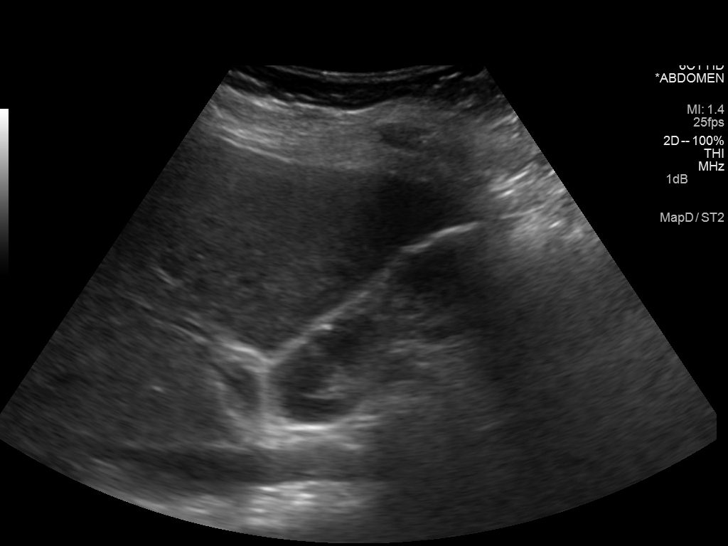
[im 39/43]
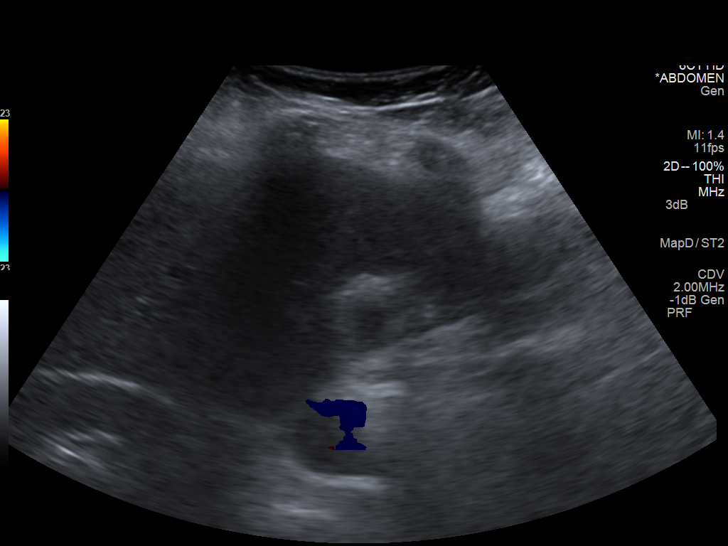
[im 43/43]
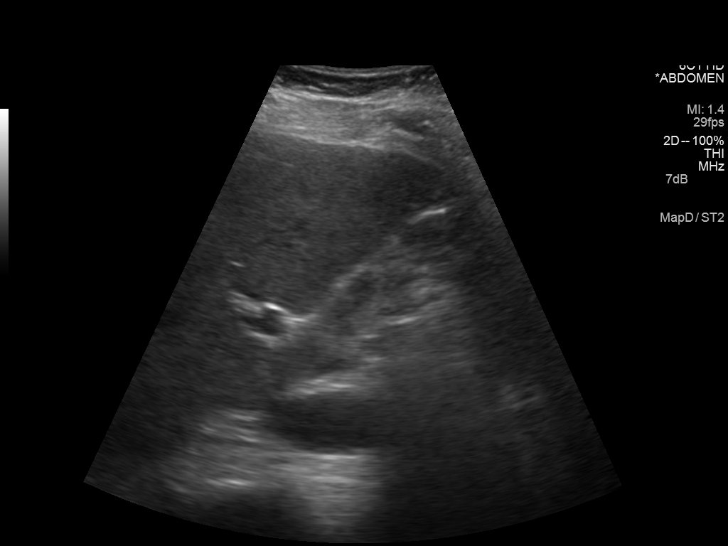

[14 of 25 positions shown; findings below may reference images not displayed]

FINDINGS: Gallbladder:

Surgically absent.

Common bile duct:

Diameter: 5 mm. There is no intrahepatic or extrahepatic biliary
duct dilatation.

Liver:

No focal lesion identified. Within normal limits in parenchymal
echogenicity.
IMPRESSION: Gallbladder surgically absent.  Study otherwise unremarkable.

## 2015-02-12 ENCOUNTER — Ambulatory Visit: Payer: Self-pay

## 2015-06-21 ENCOUNTER — Encounter: Payer: Self-pay | Admitting: Emergency Medicine

## 2015-06-21 ENCOUNTER — Emergency Department
Admission: EM | Admit: 2015-06-21 | Discharge: 2015-06-21 | Disposition: A | Discharge status: Home / Self Care | Payer: No Typology Code available for payment source | Attending: Emergency Medicine | Admitting: Emergency Medicine

## 2015-06-21 DIAGNOSIS — R350 Frequency of micturition: Secondary | ICD-10-CM | POA: Insufficient documentation

## 2015-06-21 DIAGNOSIS — R51 Headache: Secondary | ICD-10-CM | POA: Diagnosis present

## 2015-06-21 DIAGNOSIS — J069 Acute upper respiratory infection, unspecified: Secondary | ICD-10-CM | POA: Diagnosis not present

## 2015-06-21 LAB — URINALYSIS COMPLETE WITH MICROSCOPIC (ARMC ONLY)
Bilirubin Urine: NEGATIVE
Glucose, UA: NEGATIVE mg/dL
Ketones, ur: NEGATIVE mg/dL
NITRITE: NEGATIVE
Protein, ur: 30 mg/dL — AB
Specific Gravity, Urine: 1.027 (ref 1.005–1.030)
pH: 5 (ref 5.0–8.0)

## 2015-06-21 MED ORDER — GUAIFENESIN 100 MG/5ML PO SOLN: 5.0000 mL | ORAL | Status: DC | PRN

## 2015-06-21 MED ORDER — LORATADINE 10 MG PO TABS: 10.0000 mg | ORAL_TABLET | Freq: Every day | ORAL | Status: DC

## 2015-06-21 NOTE — ED Notes (Addendum)
Patient ambulatory to triage with steady gait, without difficulty or distress noted; pt reports x 3 days having frontal HA with sinus congestion; denies cough, denies CP; also st odor to urine; denies dysuria but reports frequency; st took tylenol PTA

## 2015-06-21 NOTE — ED Notes (Signed)
Pt informed to return if any life threatening symptoms occur.  

## 2015-06-21 NOTE — Discharge Instructions (Signed)
Upper Respiratory Infection, Adult An upper respiratory infection (URI) is also sometimes known as the common cold. The upper respiratory tract includes the nose, sinuses, throat, trachea, and bronchi. Bronchi are the airways leading to the lungs. Most people improve within 1 week, but symptoms can last up to 2 weeks. A residual cough may last even longer.  CAUSES Many different viruses can infect the tissues lining the upper respiratory tract. The tissues become irritated and inflamed and often become very moist. Mucus production is also common. A cold is contagious. You can easily spread the virus to others by oral contact. This includes kissing, sharing a glass, coughing, or sneezing. Touching your mouth or nose and then touching a surface, which is then touched by another person, can also spread the virus. SYMPTOMS  Symptoms typically develop 1 to 3 days after you come in contact with a cold virus. Symptoms vary from person to person. They may include:  Runny nose.  Sneezing.  Nasal congestion.  Sinus irritation.  Sore throat.  Loss of voice (laryngitis).  Cough.  Fatigue.  Muscle aches.  Loss of appetite.  Headache.  Low-grade fever. DIAGNOSIS  You might diagnose your own cold based on familiar symptoms, since most people get a cold 2 to 3 times a year. Your caregiver can confirm this based on your exam. Most importantly, your caregiver can check that your symptoms are not due to another disease such as strep throat, sinusitis, pneumonia, asthma, or epiglottitis. Blood tests, throat tests, and X-rays are not necessary to diagnose a common cold, but they may sometimes be helpful in excluding other more serious diseases. Your caregiver will decide if any further tests are required. RISKS AND COMPLICATIONS  You may be at risk for a more severe case of the common cold if you smoke cigarettes, have chronic heart disease (such as heart failure) or lung disease (such as asthma), or if  you have a weakened immune system. The very young and very old are also at risk for more serious infections. Bacterial sinusitis, middle ear infections, and bacterial pneumonia can complicate the common cold. The common cold can worsen asthma and chronic obstructive pulmonary disease (COPD). Sometimes, these complications can require emergency medical care and may be life-threatening. PREVENTION  The best way to protect against getting a cold is to practice good hygiene. Avoid oral or hand contact with people with cold symptoms. Wash your hands often if contact occurs. There is no clear evidence that vitamin C, vitamin E, echinacea, or exercise reduces the chance of developing a cold. However, it is always recommended to get plenty of rest and practice good nutrition. TREATMENT  Treatment is directed at relieving symptoms. There is no cure. Antibiotics are not effective, because the infection is caused by a virus, not by bacteria. Treatment may include:  Increased fluid intake. Sports drinks offer valuable electrolytes, sugars, and fluids.  Breathing heated mist or steam (vaporizer or shower).  Eating chicken soup or other clear broths, and maintaining good nutrition.  Getting plenty of rest.  Using gargles or lozenges for comfort.  Controlling fevers with ibuprofen or acetaminophen as directed by your caregiver.  Increasing usage of your inhaler if you have asthma. Zinc gel and zinc lozenges, taken in the first 24 hours of the common cold, can shorten the duration and lessen the severity of symptoms. Pain medicines may help with fever, muscle aches, and throat pain. A variety of non-prescription medicines are available to treat congestion and runny nose. Your caregiver   can make recommendations and may suggest nasal or lung inhalers for other symptoms.  HOME CARE INSTRUCTIONS   Only take over-the-counter or prescription medicines for pain, discomfort, or fever as directed by your  caregiver.  Use a warm mist humidifier or inhale steam from a shower to increase air moisture. This may keep secretions moist and make it easier to breathe.  Drink enough water and fluids to keep your urine clear or pale yellow.  Rest as needed.  Return to work when your temperature has returned to normal or as your caregiver advises. You may need to stay home longer to avoid infecting others. You can also use a face mask and careful hand washing to prevent spread of the virus. SEEK MEDICAL CARE IF:   After the first few days, you feel you are getting worse rather than better.  You need your caregiver's advice about medicines to control symptoms.  You develop chills, worsening shortness of breath, or brown or red sputum. These may be signs of pneumonia.  You develop yellow or brown nasal discharge or pain in the face, especially when you bend forward. These may be signs of sinusitis.  You develop a fever, swollen neck glands, pain with swallowing, or white areas in the back of your throat. These may be signs of strep throat. SEEK IMMEDIATE MEDICAL CARE IF:   You have a fever.  You develop severe or persistent headache, ear pain, sinus pain, or chest pain.  You develop wheezing, a prolonged cough, cough up blood, or have a change in your usual mucus (if you have chronic lung disease).  You develop sore muscles or a stiff neck. Document Released: 06/05/2001 Document Revised: 03/03/2012 Document Reviewed: 03/17/2014 [employer] Patient Information 2015 [employer]. This information is not intended to replace advice given to you by your health care provider. Make sure you discuss any questions you have with your health care provider.  

## 2015-06-21 NOTE — ED Provider Notes (Signed)
[facility] Emergency Department Provider Note  ____________________________________________  Time seen: 7:15 AM  I have reviewed the triage vital signs and the nursing notes.   HISTORY  Chief Complaint Headache and Urinary Frequency    HPI [name] is a 59 y.o. female who complains of nasal congestion and bilateral frontal headache for 3 days. She also has mild sore throat. She denies any recent fever. No chest pain or shortness of breath, denies cough. No vision changes dizziness or syncope. No hearing changes. No numbness tingling weakness, or loss of balance or coordination.   She is eating and drinking normally. She also notes urinary frequency, worse at night. She notes that she experiences bilateral lower extremity swelling during the day when she is upright at work, and then at night when she lays flat the swelling goes down but she has to pee a lot throughout the night.   History reviewed. No pertinent past medical history. Peripheral edema  There are no active problems to display for this patient.   Past Surgical History  Procedure Laterality Date  . Cholecystectomy    Left leg soft tissue surgery  Current Outpatient Rx  Name  Route  Sig  Dispense  Refill  . guaiFENesin (ROBITUSSIN) 100 MG/5ML SOLN   Oral   Take 5 mLs (100 mg total) by mouth every 4 (four) hours as needed for cough or to loosen phlegm.   120 mL   0   . loratadine (CLARITIN) 10 MG tablet   Oral   Take 1 tablet (10 mg total) by mouth daily.   30 tablet   0     Allergies Codeine  No family history on file.  Social History History  Substance Use Topics  . Smoking status: Never Smoker   . Smokeless tobacco: Not on file  . Alcohol Use: No    Review of Systems  Constitutional: No fever or chills. No weight changes Eyes:No blurry vision or double vision.  WUJ:WJXBJYNWENT:Positive sore throat.Headache and nasal congestion as above  Cardiovascular: No chest  pain. Respiratory: No dyspnea or cough. Gastrointestinal: Negative for abdominal pain, vomiting and diarrhea.  No BRBPR or melena. Genitourinary:Urinary frequency without dysuria or hematuria.. Musculoskeletal: Negative for back pain. No joint swelling or pain. Skin: Negative for rash.Daytime peripheral edema daily.  Neurological: Negative for headaches, focal weakness or numbness. Psychiatric:No anxiety or depression.   Endocrine:No hot/cold intolerance, changes in energy, or sleep difficulty.  10-point ROS otherwise negative.  ____________________________________________   PHYSICAL EXAM:  VITAL SIGNS: ED Triage Vitals  Enc Vitals Group     BP 06/21/15 0622 127/69 mmHg     Pulse Rate 06/21/15 0622 74     Resp 06/21/15 0622 18     Temp 06/21/15 0622 98.1 F (36.7 C)     Temp Source 06/21/15 0622 Oral     SpO2 06/21/15 0622 99 %     Weight 06/21/15 0622 239 lb (108.41 kg)     Height 06/21/15 0622 5' (1.524 m)     Head Cir --      Peak Flow --      Pain Score 06/21/15 0623 3     Pain Loc --      Pain Edu? --      Excl. in GC? --      Constitutional: Alert and oriented. Well appearing and in no distress. Eyes: No scleral icterus. No conjunctival pallor. PERRL. EOMI ENT   Head: Normocephalic and atraumatic.TMs and external auditory  canals are normal bilaterally. No tenderness to percussion over the sinuses.   Nose:Positive nasal congestion and boggy turbinates. No septal hematoma   Mouth/Throat: MMM,mildl erythema. No peritonsillar mass. No uvula shift.   Neck: No stridor. No SubQ emphysema. No meningismus. Hematological/Lymphatic/Immunilogical: No cervical lymphadenopathy. Cardiovascular: RRR. Normal and symmetric distal pulses are present in all extremities. No murmurs, rubs, or gallops. Respiratory: Normal respiratory effort without tachypnea nor retractions. Breath sounds are clear and equal bilaterally. No wheezes/rales/rhonchi. Gastrointestinal: Soft  and nontender. No distention. There is no CVA tenderness.  No rebound, rigidity, or guarding. Genitourinary: deferred Musculoskeletal: Nontender with normal range of motion in all extremities. No joint effusions.  No lower extremity tenderness.  No edema. Neurologic:   Normal speech and language.  CN 2-10 normal. Motor grossly intact. No pronator drift.  Normal gait. No gross focal neurologic deficits are appreciated.  Skin:  Skin is warm, dry and intact. No rash noted.  No petechiae, purpura, or bullae. Psychiatric: Mood and affect are normal. Speech and behavior are normal. Patient exhibits appropriate insight and judgment.  ____________________________________________    LABS (pertinent positives/negatives) (all labs ordered are listed, but only abnormal results are displayed) Labs Reviewed  URINALYSIS COMPLETEWITH MICROSCOPIC ([facility] ONLY) - Abnormal; Notable for the following:    Color, Urine AMBER (*)    APPearance HAZY (*)    Hgb urine dipstick 2+ (*)    Protein, ur 30 (*)    Leukocytes, UA 3+ (*)    Bacteria, UA RARE (*)    Squamous Epithelial / LPF 6-30 (*)    All other components within normal limits   ____________________________________________   EKG    ____________________________________________    RADIOLOGY    ____________________________________________   PROCEDURES  ____________________________________________   INITIAL IMPRESSION / ASSESSMENT AND PLAN / ED COURSE  Pertinent labs & imaging results that were available during my care of the patient were reviewed by me and considered in my medical decision making (see chart for details).  Patient presents with nasal congestion, which appears to be due to either upper respiratory infection or seasonal allergies. I will prescribe her guaifenesin and loratadine for symptom relief, and advised the patient to follow up with her primary care doctor. She was also counseled to follow up with primary care  regarding her peripheral edema which does not appear to be cardiac or renal in nature and is most likely venous insufficiency. I have very low suspicion for meningitis encephalitis CVA ICH carotid injury CRVO/CRAO glaucoma giant cell arteritis.  ____________________________________________   FINAL CLINICAL IMPRESSION(S) / ED DIAGNOSES  Final diagnoses:  Upper respiratory infection      Sharman Cheek, MD 06/21/15 [phone]

## 2015-10-24 ENCOUNTER — Emergency Department: Payer: No Typology Code available for payment source

## 2015-10-24 ENCOUNTER — Encounter: Payer: Self-pay | Admitting: Emergency Medicine

## 2015-10-24 ENCOUNTER — Emergency Department
Admission: EM | Admit: 2015-10-24 | Discharge: 2015-10-24 | Disposition: A | Discharge status: Home / Self Care | Payer: No Typology Code available for payment source | Attending: Emergency Medicine | Admitting: Emergency Medicine

## 2015-10-24 DIAGNOSIS — L819 Disorder of pigmentation, unspecified: Secondary | ICD-10-CM | POA: Insufficient documentation

## 2015-10-24 DIAGNOSIS — Z79899 Other long term (current) drug therapy: Secondary | ICD-10-CM | POA: Insufficient documentation

## 2015-10-24 DIAGNOSIS — L03116 Cellulitis of left lower limb: Secondary | ICD-10-CM | POA: Insufficient documentation

## 2015-10-24 DIAGNOSIS — R0602 Shortness of breath: Secondary | ICD-10-CM | POA: Insufficient documentation

## 2015-10-24 LAB — COMPREHENSIVE METABOLIC PANEL
ALT: 17 U/L (ref 14–54)
AST: 25 U/L (ref 15–41)
Albumin: 4.2 g/dL (ref 3.5–5.0)
Alkaline Phosphatase: 102 U/L (ref 38–126)
Anion gap: 6 (ref 5–15)
BILIRUBIN TOTAL: 0.6 mg/dL (ref 0.3–1.2)
BUN: 15 mg/dL (ref 6–20)
CO2: 30 mmol/L (ref 22–32)
Calcium: 9.4 mg/dL (ref 8.9–10.3)
Chloride: 106 mmol/L (ref 101–111)
Creatinine, Ser: 0.67 mg/dL (ref 0.44–1.00)
GFR calc Af Amer: >60 mL/min (ref >60)
Glucose, Bld: 82 mg/dL (ref 65–99)
POTASSIUM: 3.9 mmol/L (ref 3.5–5.1)
Sodium: 142 mmol/L (ref 135–145)
Total Protein: 7.3 g/dL (ref 6.5–8.1)

## 2015-10-24 LAB — CBC
HCT: 43.6 % (ref 35.0–47.0)
Hemoglobin: 14.9 g/dL (ref 12.0–16.0)
MCH: 31.2 pg (ref 26.0–34.0)
MCHC: 34.1 g/dL (ref 32.0–36.0)
MCV: 91.5 fL (ref 80.0–100.0)
PLATELETS: 249 10*3/uL (ref 150–440)
RBC: 4.76 MIL/uL (ref 3.80–5.20)
RDW: 13.4 % (ref 11.5–14.5)
WBC: 6.3 10*3/uL (ref 3.6–11.0)

## 2015-10-24 LAB — BRAIN NATRIURETIC PEPTIDE: B Natriuretic Peptide: 167 pg/mL — ABNORMAL HIGH (ref 0.0–100.0)

## 2015-10-24 LAB — TROPONIN I: Troponin I: <0.03 ng/mL (ref <0.031)

## 2015-10-24 IMAGING — CR DG CHEST 2V
2 series · 2 of 2 positions shown · non-contrast
Comparison: [DATE] chest radiograph

CLINICAL DATA: Shortness of breath

EXAM:
CHEST  2 VIEW

[chest pa]
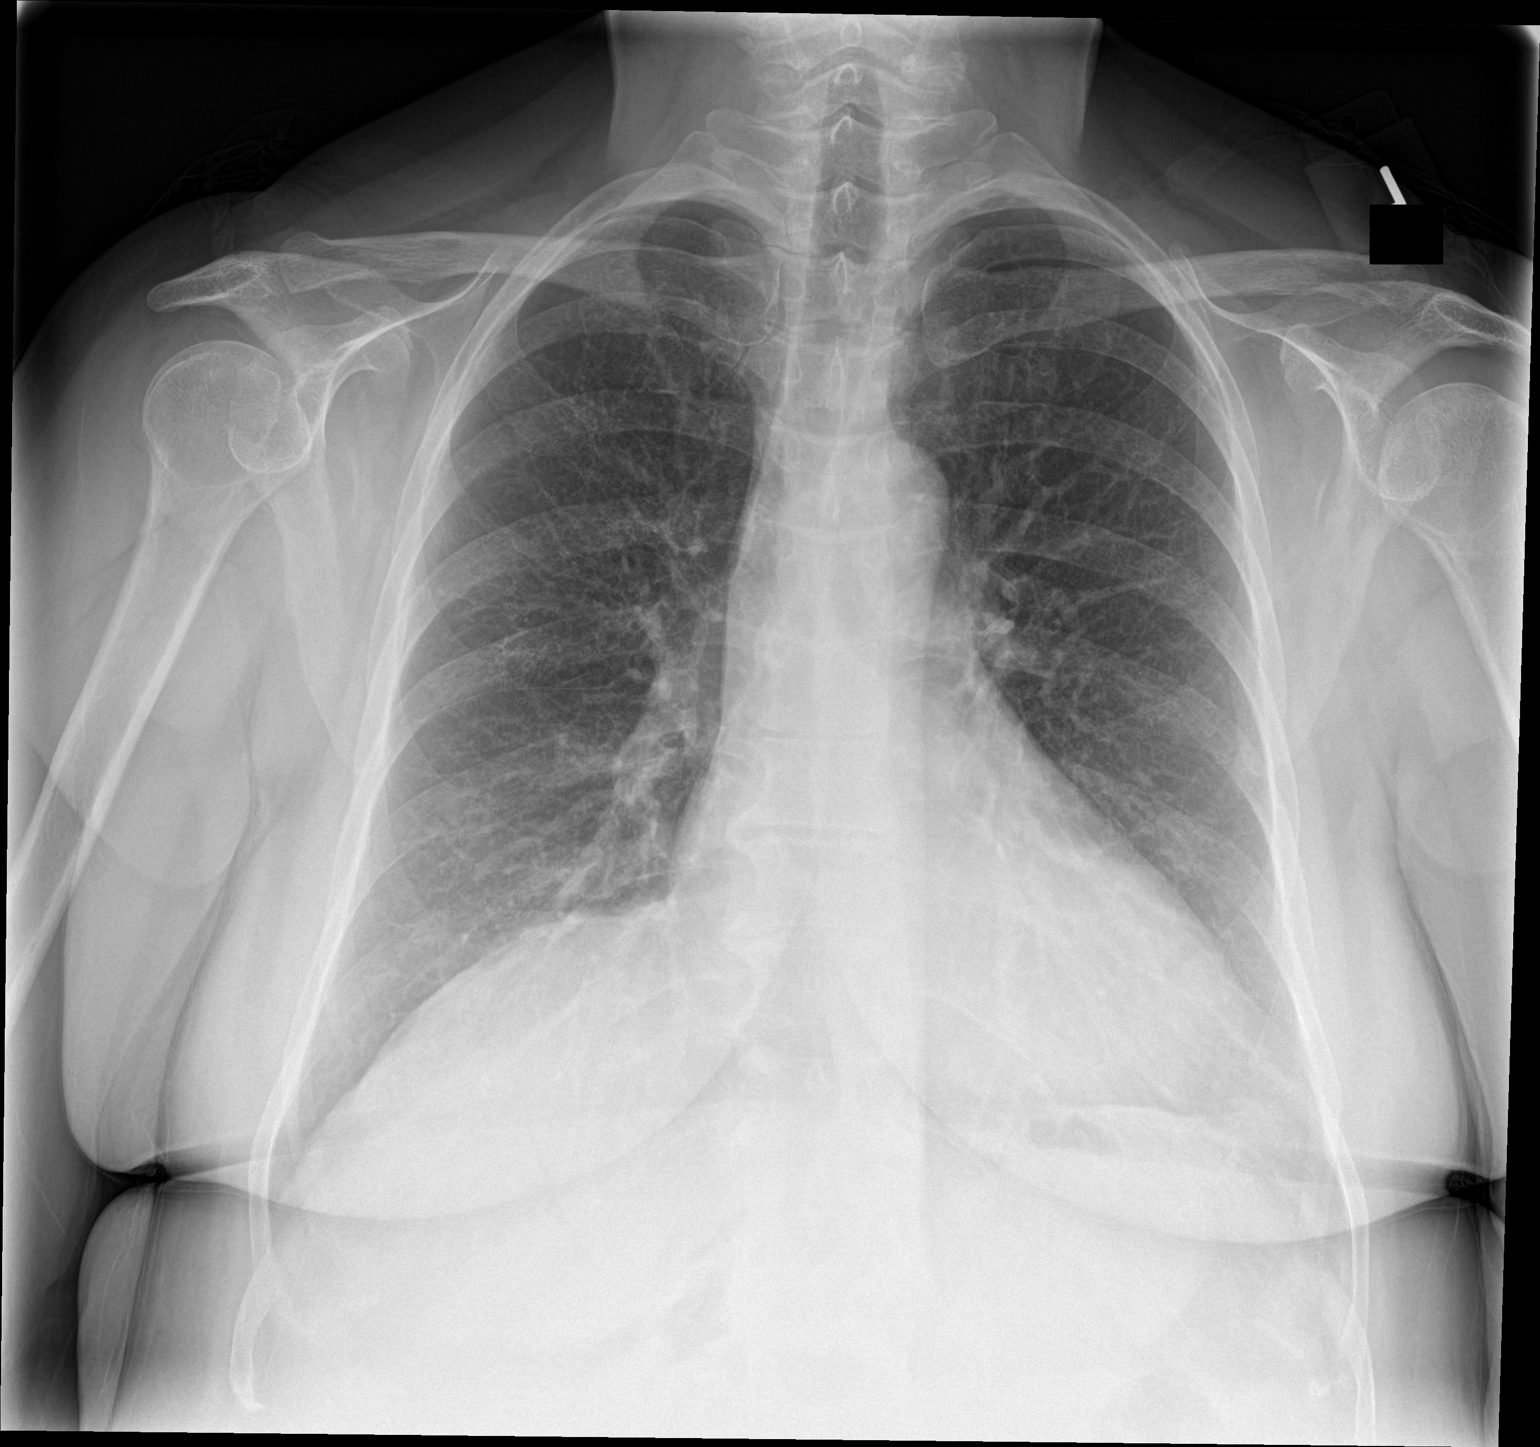

[chest lat]
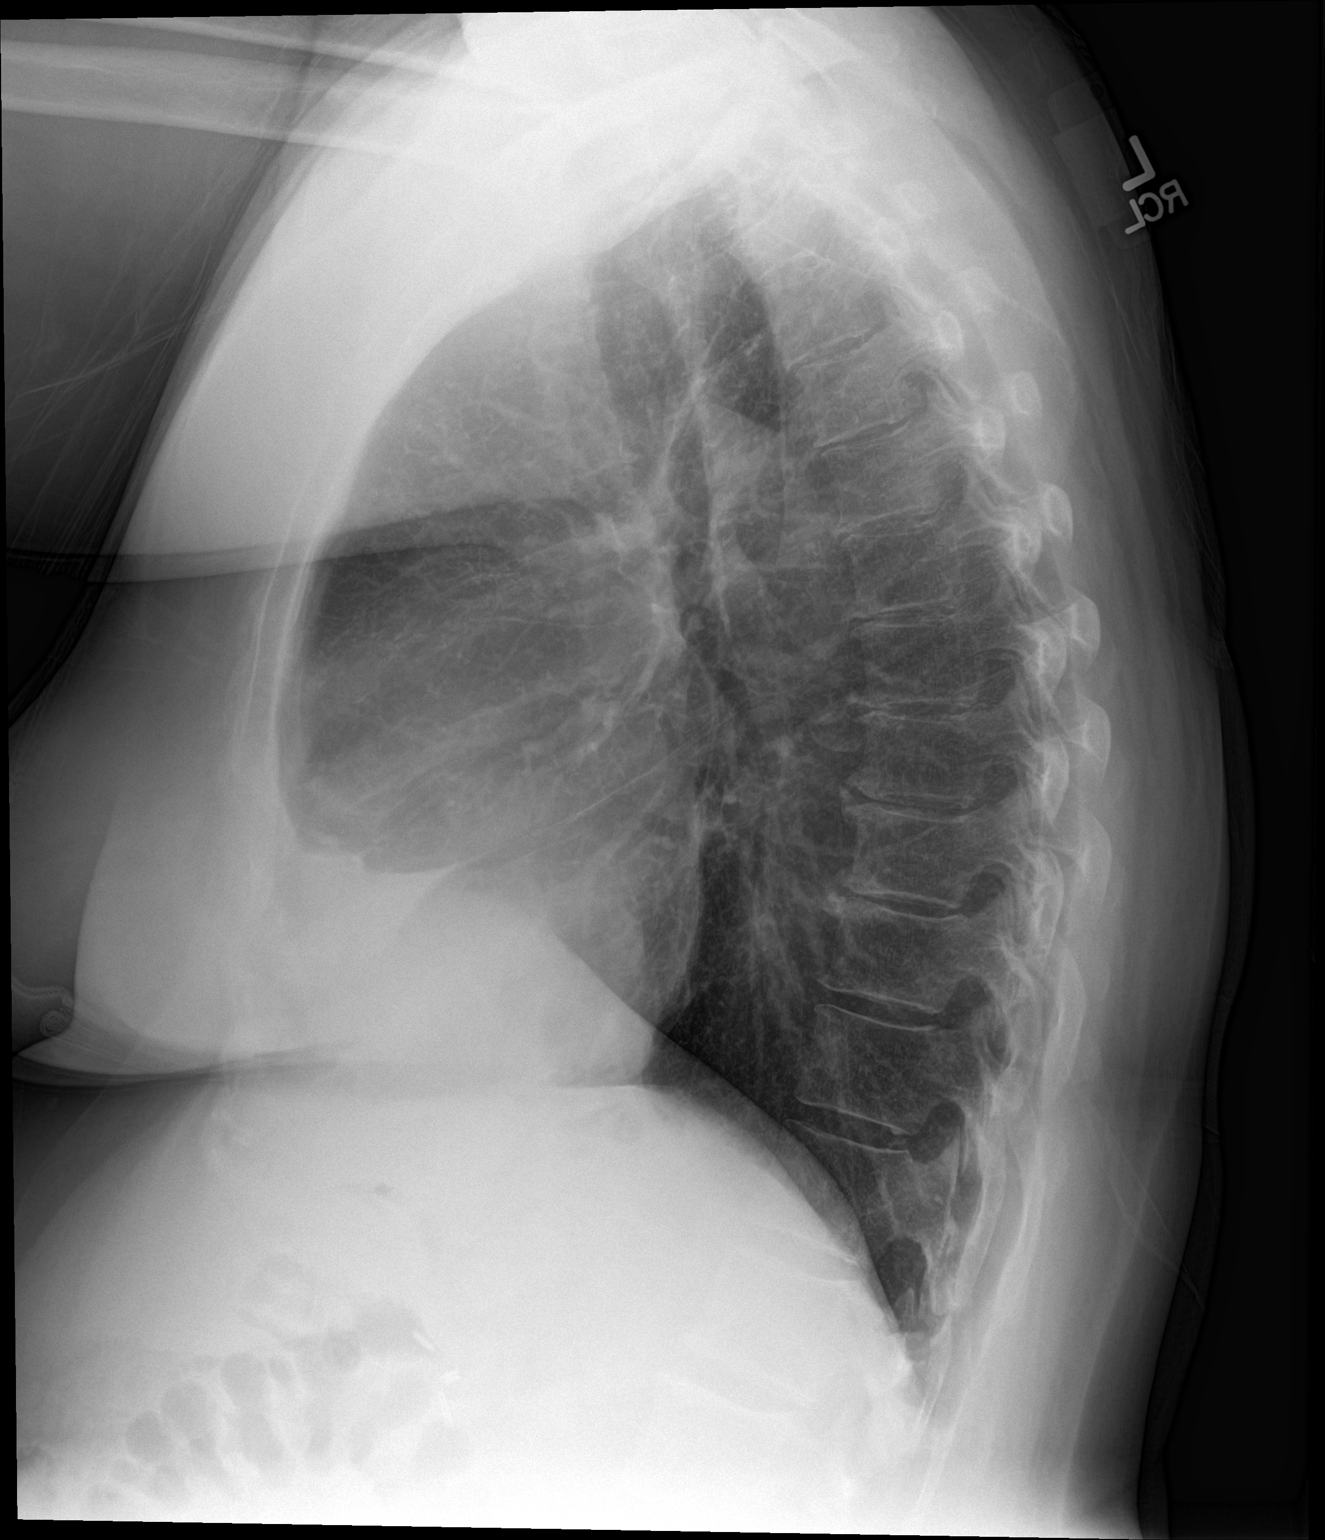

[2 of 2 positions shown; findings below may reference images not displayed]

FINDINGS: Stable cardiomediastinal silhouette with top-normal heart size. No
pneumothorax. No pleural effusion. There is mild hazy opacity at the
anterior left lung base.
IMPRESSION: Mild hazy opacity at the anterior left lung base, favor mild
scarring or atelectasis, cannot entirely exclude a mild/ developing
pneumonia.

## 2015-10-24 MED ORDER — SULFAMETHOXAZOLE-TRIMETHOPRIM 800-160 MG PO TABS: 1.0000 | ORAL_TABLET | Freq: Two times a day (BID) | ORAL | Status: DC

## 2015-10-24 MED ORDER — TRAMADOL HCL 50 MG PO TABS: 50.0000 mg | ORAL_TABLET | Freq: Four times a day (QID) | ORAL | Status: DC | PRN

## 2015-10-24 NOTE — ED Provider Notes (Signed)
[facility] Emergency Department Provider Note  ____________________________________________  Time seen: 8:00 AM  I have reviewed the triage vital signs and the nursing notes.   HISTORY  Chief Complaint Leg Pain; Shortness of Breath; and Leg Swelling    HPI [name] is a 59 y.o. female who complains of chronic lower extremity swelling but now reports that she has had a superficial ulceration of the skin in the left lower extremity over the shin that is been using for the past 3 days. Occasionally there is some blood, but it is not hemorrhaging. She denies any fevers chills chest pain or shortness of breath. Does report a history of varicose veins with some laser procedures in the past.  No recent trauma or penetrating wounds. No DOE or orthopnea or PND, no chest pain or shortness of breath   History reviewed. No pertinent past medical history.   There are no active problems to display for this patient.    Past Surgical History  Procedure Laterality Date  . Cholecystectomy       Current Outpatient Rx  Name  Route  Sig  Dispense  Refill  . guaiFENesin (ROBITUSSIN) 100 MG/5ML SOLN   Oral   Take 5 mLs (100 mg total) by mouth every 4 (four) hours as needed for cough or to loosen phlegm.   120 mL   0   . loratadine (CLARITIN) 10 MG tablet   Oral   Take 1 tablet (10 mg total) by mouth daily.   30 tablet   0   . sulfamethoxazole-trimethoprim (BACTRIM DS) 800-160 MG tablet   Oral   Take 1 tablet by mouth 2 (two) times daily.   14 tablet   0   . traMADol (ULTRAM) 50 MG tablet   Oral   Take 1 tablet (50 mg total) by mouth every 6 (six) hours as needed.   12 tablet   0      Allergies Codeine   History reviewed. No pertinent family history.  Social History Social History  Substance Use Topics  . Smoking status: Never Smoker   . Smokeless tobacco: None  . Alcohol Use: No    Review of Systems  Constitutional:   No  fever or chills. No weight changes Eyes:   No blurry vision or double vision.  ENT:   No sore throat. Cardiovascular:   No chest pain. Respiratory:   No dyspnea or cough. Gastrointestinal:   Negative for abdominal pain, vomiting and diarrhea.  No BRBPR or melena. Genitourinary:   Negative for dysuria, urinary retention, bloody urine, or difficulty urinating. Musculoskeletal:   Negative for back pain. No joint swelling or pain. Skin:   Skin wound as above Neurological:   Negative for headaches, focal weakness or numbness. Psychiatric:  No anxiety or depression.   Endocrine:  No hot/cold intolerance, changes in energy, or sleep difficulty.  10-point ROS otherwise negative.  ____________________________________________   PHYSICAL EXAM:  VITAL SIGNS: ED Triage Vitals  Enc Vitals Group     BP 10/24/15 0615 151/85 mmHg     Pulse Rate 10/24/15 0615 51     Resp 10/24/15 0615 20     Temp 10/24/15 0615 97.9 F (36.6 C)     Temp Source 10/24/15 0615 Oral     SpO2 10/24/15 0615 100 %     Weight 10/24/15 0615 234 lb (106.142 kg)     Height 10/24/15 0615  (1.575 m)     Head Cir --  Peak Flow --      Pain Score 10/24/15 0615 4     Pain Loc --      Pain Edu? --      Excl. in GC? --      Constitutional:   Alert and oriented. Well appearing and in no distress. Eyes:   No scleral icterus. No conjunctival pallor. PERRL. EOMI ENT   Head:   Normocephalic and atraumatic.   Nose:   No congestion/rhinnorhea. No septal hematoma   Mouth/Throat:   MMM, no pharyngeal erythema. No peritonsillar mass. No uvula shift.   Neck:   No stridor. No SubQ emphysema. No meningismus. No JVD Hematological/Lymphatic/Immunilogical:   No cervical lymphadenopathy. Cardiovascular:   RRR. Normal and symmetric distal pulses are present in all extremities. No murmurs, rubs, or gallops. Respiratory:   Normal respiratory effort without tachypnea nor retractions. Breath sounds are clear and equal  bilaterally. No wheezes/rales/rhonchi. Gastrointestinal:   Soft and nontender. No distention. There is no CVA tenderness.  No rebound, rigidity, or guarding. Genitourinary:   deferred Musculoskeletal:  Symmetric nonpitting swelling of bilateral lower extremities with diffuse varicose veins. Over the left lower shin there is an irregular plaque of slightly raised hyperpigmentation approximately 4 x 6 cm. The patient reports this is an area where there is previously been laser therapy and she has poor circulation in that area. The skin does appear to be very thin in this area. In the middle of it there is a approximately 1-2 cm shallow ulceration through the skin surface not extending through the dermis. There is no purulent drainage or clear inflammation. The area is mildly tender to the touch. Neurologic:   Normal speech and language.  CN 2-10 normal. Motor grossly intact. No pronator drift.  Normal gait. No gross focal neurologic deficits are appreciated.  Skin:    Skin is warm, dry and intact. No rash noted.  No petechiae, purpura, or bullae. Psychiatric:   Mood and affect are normal. Speech and behavior are normal. Patient exhibits appropriate insight and judgment.  ____________________________________________    LABS (pertinent positives/negatives) (all labs ordered are listed, but only abnormal results are displayed) Labs Reviewed  BRAIN NATRIURETIC PEPTIDE - Abnormal; Notable for the following:    B Natriuretic Peptide 167.0 (*)    All other components within normal limits  CBC  TROPONIN I  COMPREHENSIVE METABOLIC PANEL   ____________________________________________   EKG  Interpreted by me Sinus bradycardia rate of 53, normal axis normal intervals, normal QRS and ST segments and T waves  ____________________________________________    RADIOLOGY  Chest x-ray  unremarkable  ____________________________________________   PROCEDURES   ____________________________________________   INITIAL IMPRESSION / ASSESSMENT AND PLAN / ED COURSE  Pertinent labs & imaging results that were available during my care of the patient were reviewed by me and considered in my medical decision making (see chart for details).  Patient presents with a small wound on the left lower extremity in the setting of poor circulation and obesity. No clear cellulitis abscess necrotizing fasciitis or osteomyelitis, but I'll treat her with Bactrim to cover as I'm sure that this wound is satting elevated risk of getting infected and will likely have delayed healing. Also give her some pain medicine as she has been trying NSAIDs without relief. No evidence of sepsis, low suspicion for DVT or trauma. No fracture or dislocation. Follow-up with primary care.     ____________________________________________   FINAL CLINICAL IMPRESSION(S) / ED DIAGNOSES  Final diagnoses:  Cellulitis of  left lower extremity      Sharman Cheek, MD 10/24/15 [phone]

## 2015-10-24 NOTE — ED Notes (Signed)
Pt c/o left lower leg pain for 2 weeks; pt says she "swells all the time" but swelling seems more than usual; pt says she started having bleeding to anterior lower leg 3 days ago; dressing in place; quarter sized open area present; bruising noted to site; pt says she's been feeling short of breath; talking in complete coherent sentences

## 2015-10-24 NOTE — Discharge Instructions (Signed)
You were prescribed a medication that is potentially sedating. Do not drink alcohol, drive or participate in any other potentially dangerous activities while taking this medication as it may make you sleepy. Do not take this medication with any other sedating medications, either prescription or over-the-counter. If you were prescribed Percocet or Vicodin, do not take these with acetaminophen (Tylenol) as it is already contained within these medications. °  °Opioid pain medications (or "narcotics") can be habit forming.  Use it as little as possible to achieve adequate pain control.  Do not use or use it with extreme caution if you have a history of opiate abuse or dependence.  If you are on a pain contract with your primary care doctor or a pain specialist, be sure to let them know you were prescribed this medication today from the [facility].  This medication is intended for your use only - do not give any to anyone else and keep it in a secure place where nobody else, especially children and pets, have access to it.  It will also cause or worsen constipation, so you may want to consider taking an over-the-counter stool softener while you are taking this medication. ° °Cellulitis °Cellulitis is an infection of the skin and the tissue beneath it. The infected area is usually red and tender. Cellulitis occurs most often in the arms and lower legs.  °CAUSES  °Cellulitis is caused by bacteria that enter the skin through cracks or cuts in the skin. The most common types of bacteria that cause cellulitis are staphylococci and streptococci. °SIGNS AND SYMPTOMS  °· Redness and warmth. °· Swelling. °· Tenderness or pain. °· Fever. °DIAGNOSIS  °Your health care provider can usually determine what is wrong based on a physical exam. Blood tests may also be done. °TREATMENT  °Treatment usually involves taking an antibiotic medicine. °HOME CARE INSTRUCTIONS  °· Take your antibiotic medicine as  directed by your health care provider. Finish the antibiotic even if you start to feel better. °· Keep the infected arm or leg elevated to reduce swelling. °· Apply a warm cloth to the affected area up to 4 times per day to relieve pain. °· Take medicines only as directed by your health care provider. °· Keep all follow-up visits as directed by your health care provider. °SEEK MEDICAL CARE IF:  °· You notice red streaks coming from the infected area. °· Your red area gets larger or turns dark in color. °· Your bone or joint underneath the infected area becomes painful after the skin has healed. °· Your infection returns in the same area or another area. °· You notice a swollen bump in the infected area. °· You develop new symptoms. °· You have a fever. °SEEK IMMEDIATE MEDICAL CARE IF:  °· You feel very sleepy. °· You develop vomiting or diarrhea. °· You have a general ill feeling (malaise) with muscle aches and pains. °  °This information is not intended to replace advice given to you by your health care provider. Make sure you discuss any questions you have with your health care provider. °  °Document Released: 09/19/2005 Document Revised: 08/31/2015 Document Reviewed: 02/25/2012 °Elsevier Interactive Patient Education ©2016 Elsevier Inc[employer] ° °

## 2015-11-21 ENCOUNTER — Emergency Department
Admission: EM | Admit: 2015-11-21 | Discharge: 2015-11-21 | Disposition: A | Discharge status: Home / Self Care | Payer: Self-pay | Attending: Emergency Medicine | Admitting: Emergency Medicine

## 2015-11-21 ENCOUNTER — Encounter: Payer: Self-pay | Admitting: Medical Oncology

## 2015-11-21 DIAGNOSIS — S81802A Unspecified open wound, left lower leg, initial encounter: Secondary | ICD-10-CM

## 2015-11-21 DIAGNOSIS — L97829 Non-pressure chronic ulcer of other part of left lower leg with unspecified severity: Secondary | ICD-10-CM | POA: Insufficient documentation

## 2015-11-21 DIAGNOSIS — Z79899 Other long term (current) drug therapy: Secondary | ICD-10-CM | POA: Insufficient documentation

## 2015-11-21 MED ORDER — CLINDAMYCIN HCL 150 MG PO CAPS: 300.0000 mg | ORAL_CAPSULE | Freq: Four times a day (QID) | ORAL | Status: DC

## 2015-11-21 MED ORDER — TRAMADOL HCL 50 MG PO TABS: 50.0000 mg | ORAL_TABLET | Freq: Four times a day (QID) | ORAL | Status: DC | PRN

## 2015-11-21 NOTE — ED Notes (Signed)
States she has pain and a varicose vein to left lwoer leg for about 1-2 weeks  Also has been bleeding denies any trauma to leg

## 2015-11-21 NOTE — ED Provider Notes (Signed)
[facility] Emergency Department Provider Note  ____________________________________________  Time seen: Approximately 8:12 AM  I have reviewed the triage vital signs and the nursing notes.   HISTORY  Chief Complaint Leg Pain  HPI [place] A Schoonmaker is a 59 y.o. female symptom complaint of left lower leg pain for approximately 1-2 weeks. Patient states that she has had problems with this leg in the past and actually was patient at the wound care center.She states she has had problems with ulcers on her skin in the past. This particular time she states that it was a varicose pain that started bleeding and she has wrapped it to help control the bleeding. She has had a history of laser surgery over this area approximately 1 year ago. She denies any signs of infection. Crush rates her pain as an 8 out of 10.   History reviewed. No pertinent past medical history.  There are no active problems to display for this patient.   Past Surgical History  Procedure Laterality Date  . Cholecystectomy      Current Outpatient Rx  Name  Route  Sig  Dispense  Refill  . clindamycin (CLEOCIN) 150 MG capsule   Oral   Take 2 capsules (300 mg total) by mouth 4 (four) times daily.   56 capsule   0   . guaiFENesin (ROBITUSSIN) 100 MG/5ML SOLN   Oral   Take 5 mLs (100 mg total) by mouth every 4 (four) hours as needed for cough or to loosen phlegm.   120 mL   0   . loratadine (CLARITIN) 10 MG tablet   Oral   Take 1 tablet (10 mg total) by mouth daily.   30 tablet   0   . traMADol (ULTRAM) 50 MG tablet   Oral   Take 1 tablet (50 mg total) by mouth every 6 (six) hours as needed.   20 tablet   0     Allergies Codeine; Septra ds; and Tramadol  No family history on file.  Social History Social History  Substance Use Topics  . Smoking status: Never Smoker   . Smokeless tobacco: None  . Alcohol Use: No    Review of Systems Constitutional: No  fever/chills Eyes: No visual changes. Cardiovascular: Denies chest pain. Respiratory: Denies shortness of breath. Gastrointestinal:   No nausea, no vomiting.   Musculoskeletal: Negative for back pain. Skin: Negative for rash. Chronic skin changes to the left leg. Neurological: Negative for headaches, focal weakness or numbness.  10-point ROS otherwise negative.  ____________________________________________   PHYSICAL EXAM:  VITAL SIGNS: ED Triage Vitals  Enc Vitals Group     BP 11/21/15 0746 198/73 mmHg     Pulse Rate 11/21/15 0746 56     Resp 11/21/15 0746 18     Temp 11/21/15 0746 98 F (36.7 C)     Temp Source 11/21/15 0746 Oral     SpO2 11/21/15 0746 98 %     Weight 11/21/15 0746 240 lb (108.863 kg)     Height 11/21/15 0746 5' (1.524 m)     Head Cir --      Peak Flow --      Pain Score 11/21/15 0747 8     Pain Loc --      Pain Edu? --      Excl. in GC? --     Constitutional: Alert and oriented. Well appearing and in no acute distress. Eyes: Conjunctivae are normal. PERRL. EOMI. Head: Atraumatic. Nose: No  congestion/rhinnorhea. Neck: No stridor.   Cardiovascular: Normal rate, regular rhythm. Grossly normal heart sounds.  Good peripheral circulation. Respiratory: Normal respiratory effort.  No retractions. Lungs CTAB. Gastrointestinal: Soft and nontender. No distention. Musculoskeletal: Moves upper extremities without any difficulty. Patient is able to move lower extremities without any restriction. Neurologic:  Normal speech and language. No gross focal neurologic deficits are appreciated. No gait instability. Skin:  Skin is warm, dry and intact. Left anterior leg there is chronic peripheral vascular changes present. There is no evidence of bleeding and there is minimal ulceration development in this area. There is questionable history of MRSA in the past patient was started on clindamycin and also tramadol for pain control. Psychiatric: Mood and affect are normal.  Speech and behavior are normal.  ____________________________________________   LABS (all labs ordered are listed, but only abnormal results are displayed)  Labs Reviewed - No data to display PROCEDURES  Procedure(s) performed: None  Critical Care performed: No  ____________________________________________   INITIAL IMPRESSION / ASSESSMENT AND PLAN / ED COURSE  Pertinent labs & imaging results that were available during my care of the patient were reviewed by me and considered in my medical decision making (see chart for details).  Patient was started on clindamycin for infection and also tramadol as needed for pain. She is aware that this could cause drowsiness and increase her chances for falling. She will call today to see if she can get an appointment back with wound care center. Until her appointment and she'll continue to elevate, change dressings daily, take antibiotics and return to the emergency room if any severe worsening urgent concerns. ____________________________________________   FINAL CLINICAL IMPRESSION(S) / ED DIAGNOSES  Final diagnoses:  Leg wound, left, initial encounter      Tommi Rumps, PA-C 11/21/15 1543  Emily Filbert, MD 11/21/15 [phone]

## 2015-11-21 NOTE — Discharge Instructions (Signed)
Delayed Wound Closure Sometimes, your health care provider will decide to delay closing a wound for several days. This is done when the wound is badly bruised, dirty, or when it has been several hours since the injury happened. By delaying the closure of your wound, the risk of infection is reduced. Wounds that are closed in 3-7 days after being cleaned up and dressed heal just as well as those that are closed right away. HOME CARE INSTRUCTIONS  Rest and elevate the injured area until the pain and swelling are gone.  Have your wound checked as instructed by your health care provider. SEEK MEDICAL CARE IF:  You develop unusual or increased swelling or redness around the wound.  You have increasing pain or tenderness.  There is increasing fluid (drainage) or a bad smelling drainage coming from the wound.   This information is not intended to replace advice given to you by your health care provider. Make sure you discuss any questions you have with your health care provider.   Document Released: 12/10/2005 Document Revised: 12/15/2013 Document Reviewed: 06/09/2013 Elsevier Interactive Patient Education 2016 [employer]     Call the wound care center today for an appointment Change dressing daily Begin taking clindamycin 4 times a day. Take tramadol one half tablet every 6 hours as needed for pain. He may also take ibuprofen if needed. Call your doctor if any continued problems.

## 2015-11-21 NOTE — ED Notes (Signed)
Pt reports left lower leg pain and bleeding from varicose vein x 1.5 week. Pt reports she had laser surgery on area over a year ago.

## 2015-12-28 ENCOUNTER — Emergency Department: Payer: Self-pay

## 2015-12-28 ENCOUNTER — Emergency Department
Admission: EM | Admit: 2015-12-28 | Discharge: 2015-12-28 | Disposition: A | Discharge status: Home / Self Care | Payer: Self-pay | Attending: Emergency Medicine | Admitting: Emergency Medicine

## 2015-12-28 ENCOUNTER — Encounter: Payer: Self-pay | Admitting: Emergency Medicine

## 2015-12-28 DIAGNOSIS — S81802A Unspecified open wound, left lower leg, initial encounter: Secondary | ICD-10-CM

## 2015-12-28 DIAGNOSIS — G8929 Other chronic pain: Secondary | ICD-10-CM | POA: Insufficient documentation

## 2015-12-28 DIAGNOSIS — L988 Other specified disorders of the skin and subcutaneous tissue: Secondary | ICD-10-CM | POA: Insufficient documentation

## 2015-12-28 LAB — CBC
HEMATOCRIT: 40.8 % (ref 35.0–47.0)
Hemoglobin: 13.7 g/dL (ref 12.0–16.0)
MCH: 30.4 pg (ref 26.0–34.0)
MCHC: 33.6 g/dL (ref 32.0–36.0)
MCV: 90.7 fL (ref 80.0–100.0)
Platelets: 252 10*3/uL (ref 150–440)
RBC: 4.5 MIL/uL (ref 3.80–5.20)
RDW: 13.2 % (ref 11.5–14.5)
WBC: 9 10*3/uL (ref 3.6–11.0)

## 2015-12-28 LAB — BASIC METABOLIC PANEL
ANION GAP: 5 (ref 5–15)
BUN: 17 mg/dL (ref 6–20)
CHLORIDE: 105 mmol/L (ref 101–111)
CO2: 29 mmol/L (ref 22–32)
Calcium: 9.7 mg/dL (ref 8.9–10.3)
Creatinine, Ser: 0.65 mg/dL (ref 0.44–1.00)
GFR calc non Af Amer: >60 mL/min (ref >60)
Glucose, Bld: 99 mg/dL (ref 65–99)
Potassium: 4.1 mmol/L (ref 3.5–5.1)
Sodium: 139 mmol/L (ref 135–145)

## 2015-12-28 IMAGING — CR DG TIBIA/FIBULA 2V*L*
1 series · 2 of 2 positions shown · non-contrast
Comparison: None.

CLINICAL DATA: Left lower extremity swelling and infection.

EXAM:
LEFT TIBIA AND FIBULA - 2 VIEW

[Series 1: ap · 0.17mm/px · 2 of 2 slices shown]
[im 1/2]
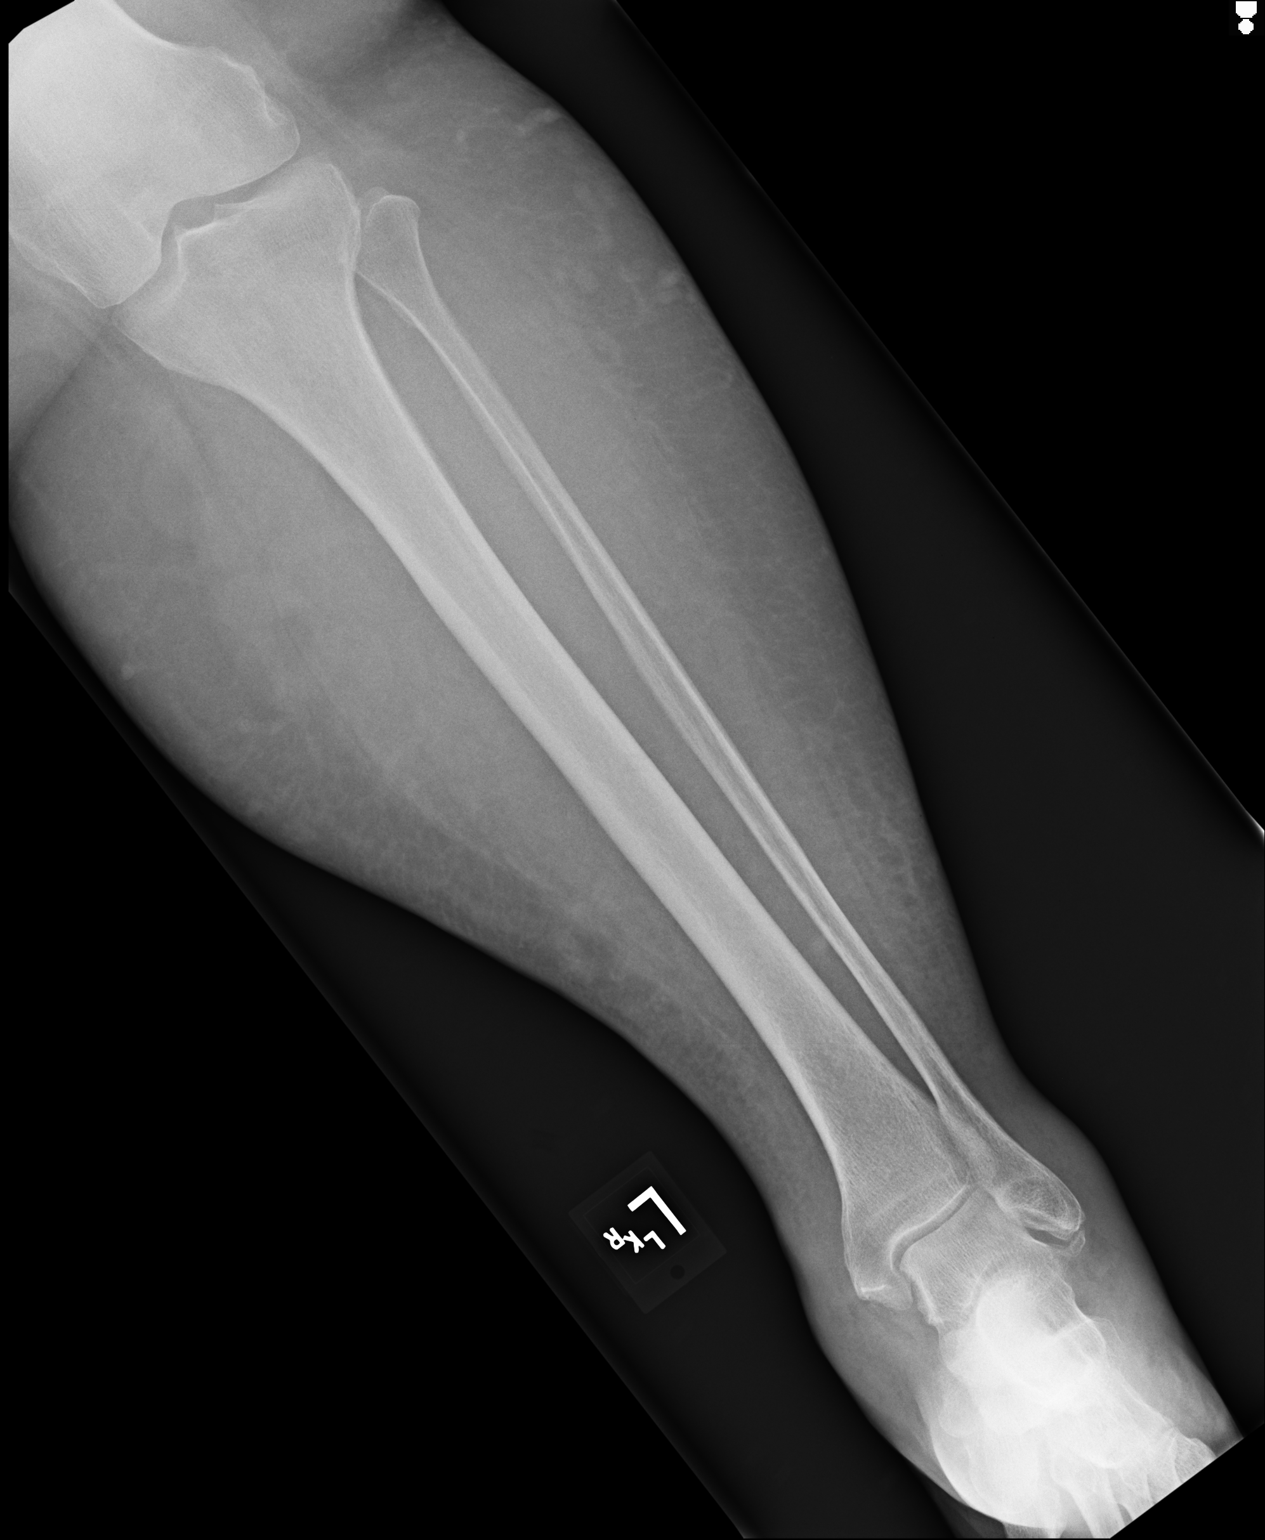
[im 2/2]
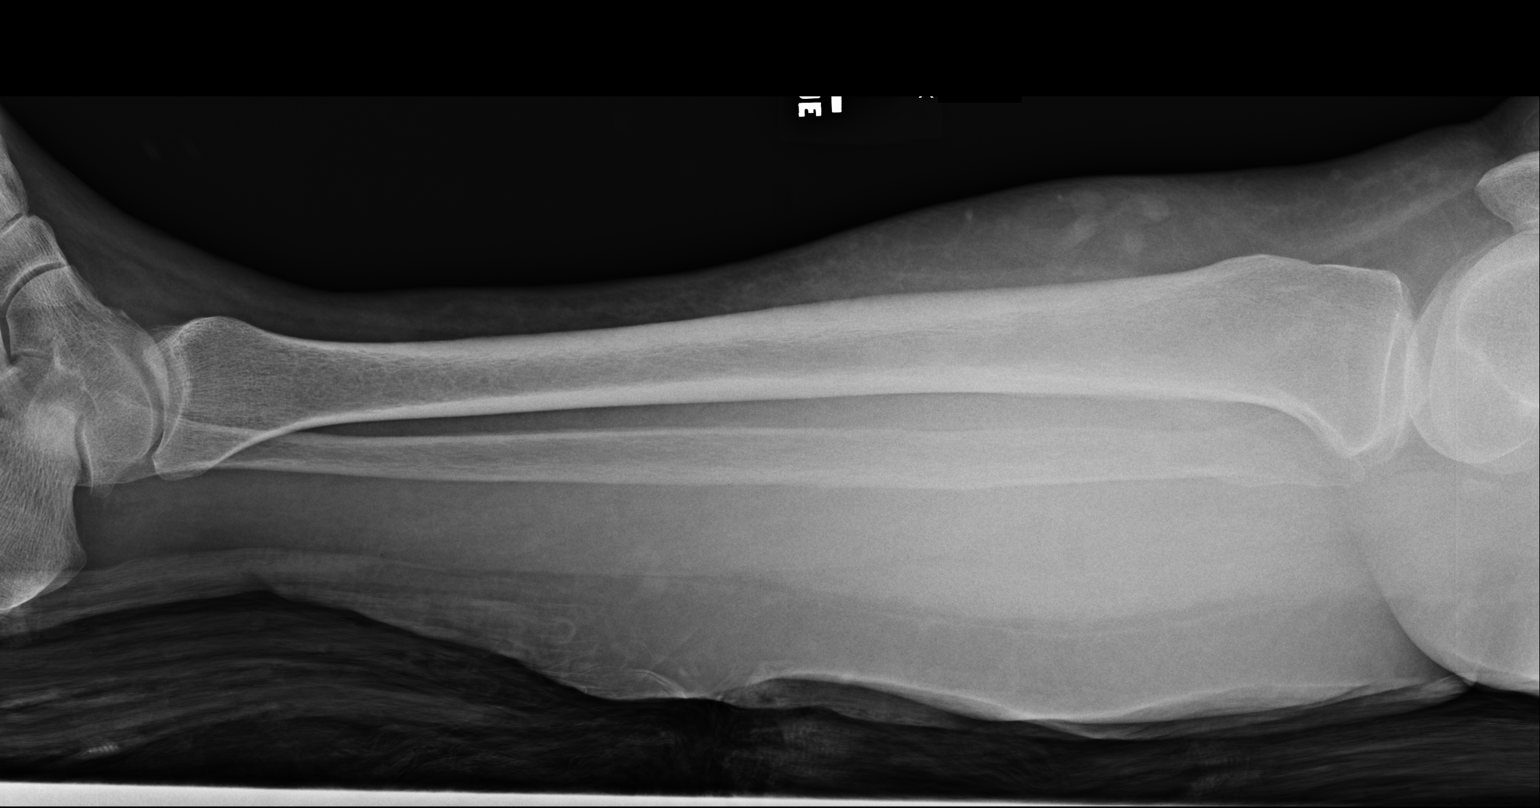

[2 of 2 positions shown; findings below may reference images not displayed]

FINDINGS: There is no evidence of fracture or other focal bone lesions. Soft
tissue swelling is noted suggesting edema or inflammation.
IMPRESSION: No significant abnormality seen in the left tibia or fibula.

## 2015-12-28 MED ORDER — TRAMADOL HCL 50 MG PO TABS
50.0000 mg | ORAL_TABLET | Freq: Once | ORAL | Status: AC
  Administered 2015-12-28: 50 mg via ORAL

## 2015-12-28 MED ORDER — SULFAMETHOXAZOLE-TRIMETHOPRIM 800-160 MG PO TABS
1.0000 | ORAL_TABLET | Freq: Two times a day (BID) | ORAL | Status: DC
Start: 2015-12-28 — End: 2016-12-10

## 2015-12-28 MED ORDER — TRAMADOL HCL 50 MG PO TABS: 50.0000 mg | ORAL_TABLET | Freq: Four times a day (QID) | ORAL | Status: DC | PRN

## 2015-12-28 MED ORDER — TRAMADOL HCL 50 MG PO TABS
ORAL_TABLET | ORAL | Status: AC
  Administered 2015-12-28: 50 mg via ORAL
  Filled 2015-12-28: qty 1

## 2015-12-28 NOTE — Clinical Social Work Note (Signed)
LCSW met with patient and provided her with a Health and safety inspector and highlighted [facility] and [facility] and DSS office.  LCSW called the wound [place] and asked if they had a pro bono program or free medical crisis services. They do not, they directed and provided myself with the [facility] number which I provided to the patient.  Patient stated there was no further needs.  [employer] LCSW [phone]

## 2015-12-28 NOTE — ED Notes (Signed)
Pt with infection to left lower leg. Pt currently being treated for same but is having green drainage and odor to wound.

## 2015-12-28 NOTE — ED Notes (Signed)
Patient given information regarding [facility], [facility], [facility], wound clinic and [employer] [facility].  Patient verbalized understanding.

## 2015-12-28 NOTE — ED Provider Notes (Signed)
[facility] Emergency Department Provider Note  ____________________________________________  Time seen: Approximately 3:38 PM  I have reviewed the triage vital signs and the nursing notes.   HISTORY  Chief Complaint Wound Infection    HPI [name] is a 60 y.o. 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 Patient states that over the last several years she has had a chronic wound in the medial aspect of the left lower extremity that is at times better or worse. Recently, she has lost her insurance and is unable to be seen at the wound clinic.She was seen here 5 or 6 weeks ago for similar symptoms and was treated with a course of antibiotics which seemed to help. She has been unable to get an appointment with a primary care physician until February. Over the last several weeks she noticed increased purulent drainage, intermittent left lower extremity swelling, and increasing pain that was responsive to tramadol but she has run out of this medication. She denies any fever, chills, nausea or vomiting. She continues to be able to ambulate. She denies any calf pain, chest pain or shortness of breath.   History reviewed. No pertinent past medical history.  There are no active problems to display for this patient.   Past Surgical History  Procedure Laterality Date  . Cholecystectomy      Current Outpatient Rx  Name  Route  Sig  Dispense  Refill  . acetaminophen (TYLENOL) 325 MG tablet   Oral   Take 650 mg by mouth every 6 (six) hours as needed.         . sulfamethoxazole-trimethoprim (BACTRIM DS,SEPTRA DS) 800-160 MG tablet   Oral   Take 1 tablet by mouth 2 (two) times daily.   28 tablet   0   . traMADol (ULTRAM) 50 MG tablet   Oral   Take 1 tablet (50 mg total) by mouth every 6 (six) hours as needed.   20  tablet   0     Allergies Codeine; Septra ds; and Tramadol  No family history on file.  Social History Social History  Substance Use Topics  . Smoking status: Never Smoker   . Smokeless tobacco: None  . Alcohol Use: No    Review of Systems Constitutional: No fever/chills. No syncope. Eyes: No visual changes. ENT: No sore throat. Cardiovascular: Denies chest pain, palpitations. Respiratory: Denies shortness of breath.  No cough. Gastrointestinal: No abdominal pain.  No nausea, no vomiting.  No diarrhea.  No constipation. Genitourinary: Negative for dysuria. Musculoskeletal: Negative for back pain. Skin: Positive for chronic wound in the left lower extremity with purulent drainage, swelling and pain. Neurological: Negative for headaches, focal weakness or numbness.  10-point ROS otherwise negative.  ____________________________________________   PHYSICAL EXAM:  VITAL SIGNS: ED Triage Vitals  Enc Vitals Group     BP 12/28/15 0911 191/94 mmHg     Pulse Rate 12/28/15 0911 60     Resp 12/28/15 0911 20     Temp 12/28/15 0911 97.6 F (36.4 C)     Temp Source 12/28/15 0911 Oral     SpO2 12/28/15 0911 100 %     Weight 12/28/15 0911 210 lb (95.255 kg)     Height 12/28/15 0911 5' (1.524 m)     Head Cir --      Peak Flow --      Pain Score  12/28/15 0912 3     Pain Loc --      Pain Edu? --      Excl. in GC? --     Constitutional: Alert and oriented. Well appearing and in no acute distress. Answer question appropriately. Eyes: Conjunctivae are normal.  EOMI. no scleral icterus.  Head: Atraumatic. Nose: No congestion/rhinnorhea. Mouth/Throat: Mucous membranes are moist.  Neck: No stridor.  Supple.   Cardiovascular: Normal rate, regular rhythm. No murmurs, rubs or gallops.  Respiratory: Normal respiratory effort.  No retractions. Lungs CTAB.  No wheezes, rales or ronchi. Musculoskeletal: Right greater than left lower extremity edema. No calf tenderness to palpation or  palpable cords. On the medial aspect of the left distal leg the patient has a 2 x 2 centimeter wound which is covered by a yellow scab with no active drainage. It is surrounded with a 5 x 5" purple discoloration that is not significantly swollen or warm to touch. Normal DP and PT pulse in the left. Normal sensation to light touch. Full range of motion of the left ankle and knee without significant pain. Neurologic:  Normal speech and language. No gross focal neurologic deficits are appreciated.  Skin:  See musculoskeletal exam for rash depiction Psychiatric: Mood and affect are normal. Speech and behavior are normal.  Normal judgement.  ____________________________________________   LABS (all labs ordered are listed, but only abnormal results are displayed)  Labs Reviewed  CBC  BASIC METABOLIC PANEL   ____________________________________________  EKG  Not indicated ____________________________________________  RADIOLOGY  Dg Tibia/fibula Left  12/28/2015  CLINICAL DATA:  Left lower extremity swelling and infection. EXAM: LEFT TIBIA AND FIBULA - 2 VIEW COMPARISON:  None. FINDINGS: There is no evidence of fracture or other focal bone lesions. Soft tissue swelling is noted suggesting edema or inflammation. IMPRESSION: No significant abnormality seen in the left tibia or fibula. Electronically Signed   By: Lupita RaiderJames  Green Jr, M.D.   On: 12/28/2015 16:13   [place] Venous Img Lower Unilateral Left  12/28/2015  CLINICAL DATA:  LEFT leg swelling for 2 weeks EXAM: LEFT LOWER EXTREMITY VENOUS DOPPLER ULTRASOUND TECHNIQUE: Gray-scale sonography with graded compression, as well as color Doppler and duplex ultrasound were performed to evaluate the lower extremity deep venous systems from the level of the common femoral vein and including the common femoral, femoral, profunda femoral, popliteal and calf veins including the posterior tibial, peroneal and gastrocnemius veins when visible. The superficial great  saphenous vein was also interrogated. Spectral Doppler was utilized to evaluate flow at rest and with distal augmentation maneuvers in the common femoral, femoral and popliteal veins. COMPARISON:  None FINDINGS: Contralateral Common Femoral Vein: Respiratory phasicity is normal and symmetric with the symptomatic side. No evidence of thrombus. Normal compressibility. Common Femoral Vein: No evidence of thrombus. Normal compressibility, respiratory phasicity and response to augmentation. Saphenofemoral Junction: No evidence of thrombus. Normal compressibility and flow on color Doppler imaging. Profunda Femoral Vein: No evidence of thrombus. Normal compressibility and flow on color Doppler imaging. Femoral Vein: No evidence of thrombus. Normal compressibility, respiratory phasicity and response to augmentation. Popliteal Vein: No evidence of thrombus. Normal compressibility, respiratory phasicity and response to augmentation. Calf Veins: Not well visualized due to soft tissue swelling. Visualized portions of the calf veins are grossly patent. Superficial Great Saphenous Vein: No evidence of thrombus. Normal compressibility and flow on color Doppler imaging. Venous Reflux:  None. Other Findings: Incidentally noted normal size lymph nodes at the LEFT inguinal region. IMPRESSION: No evidence of  deep venous thrombosis in the LEFT lower extremity. Electronically Signed   By: Ulyses Southward M.D.   On: 12/28/2015 17:04    ____________________________________________   PROCEDURES  Procedure(s) performed: None  Critical Care performed: No ____________________________________________   INITIAL IMPRESSION / ASSESSMENT AND PLAN / ED COURSE  Pertinent labs & imaging results that were available during my care of the patient were reviewed by me and considered in my medical decision making (see chart for details).  60 y.o. female with a history of chronic left lower extremity wound presenting with increased swelling,  pain and drainage. At this time, the wound does not show any active drainage, or warmth to the touch. The patient is neurovascularly intact. The patient is afebrile with a normal white blood cell count. I will get an ultrasound to rule out DVT. I will also get an x-ray to rule out any gas although I do not feel any crepitus and gangrene is very unlikely. The patient does not have any signs of acute osteomyelitis, so I will defer sedimentation rate and CRP unless there are abnormal findings in the bone on x-ray. If the patient's workup here is negative, I will plan to put her on a 14 day course of antibiotics and give her a brief course of tramadol for her pain. I have encouraged her to call the [facility] to move up her appointment, or to [facility] for a sooner appointment. We have discussed return precautions as well as follow-up instructions.  ----------------------------------------- 5:54 PM on 12/28/2015 -----------------------------------------  Patient is afebrile, has normal white blood cell count, has an x-ray does not show any gas, and her DVT study is negative.  i will plan to discharge her home with antibiotics and tramadol for pain. Plan discharge.  ____________________________________________  FINAL CLINICAL IMPRESSION(S) / ED DIAGNOSES  Final diagnoses:  Wound of left leg, initial encounter  Chronic pain      NEW MEDICATIONS STARTED DURING THIS VISIT:  New Prescriptions   SULFAMETHOXAZOLE-TRIMETHOPRIM (BACTRIM DS,SEPTRA DS) 800-160 MG TABLET    Take 1 tablet by mouth 2 (two) times daily.   TRAMADOL (ULTRAM) 50 MG TABLET    Take 1 tablet (50 mg total) by mouth every 6 (six) hours as needed.    Rockne Menghini, MD 12/28/15 1755

## 2015-12-28 NOTE — Discharge Instructions (Signed)
Please make an appointment to establish a primary care physician to be followed for your chronic wound. Please take the entire course of antibiotics even if you're feeling better.  Return to the emergency department if you develop severe pain, fever, vomiting, or any other symptoms concerning to you.

## 2015-12-28 NOTE — ED Notes (Signed)
Called Social Worker to discuss wound care options that would be financially feasible for patient due to her lack of insurance.  Patient is concerned about finances and her lack of insurance.  Social worker stated she would call the wound care center and call this RN back as well as speak with the patient.

## 2016-01-02 ENCOUNTER — Telehealth: Payer: Self-pay | Admitting: Emergency Medicine

## 2016-01-02 NOTE — ED Notes (Signed)
Pt called earlier aski9ng if she needs tochange antibiotics due to it makes her feel sick.  She is calleing from work.  i spoke to dr Fanny Bienquale and he recommends she follow up.  i told her to go to [facility] to have her leg checked.  She is trying to get pcp and [facility], but no appt til next month

## 2016-02-09 ENCOUNTER — Encounter: Payer: BLUE CROSS/BLUE SHIELD | Attending: General Surgery | Admitting: General Surgery

## 2016-02-09 ENCOUNTER — Encounter: Payer: Self-pay | Admitting: General Surgery

## 2016-02-09 DIAGNOSIS — I83029 Varicose veins of left lower extremity with ulcer of unspecified site: Secondary | ICD-10-CM

## 2016-02-09 DIAGNOSIS — I10 Essential (primary) hypertension: Secondary | ICD-10-CM | POA: Diagnosis not present

## 2016-02-09 DIAGNOSIS — L97929 Non-pressure chronic ulcer of unspecified part of left lower leg with unspecified severity: Secondary | ICD-10-CM

## 2016-02-09 DIAGNOSIS — I83221 Varicose veins of left lower extremity with both ulcer of thigh and inflammation: Secondary | ICD-10-CM | POA: Insufficient documentation

## 2016-02-09 DIAGNOSIS — Z87891 Personal history of nicotine dependence: Secondary | ICD-10-CM | POA: Insufficient documentation

## 2016-02-09 DIAGNOSIS — L97821 Non-pressure chronic ulcer of other part of left lower leg limited to breakdown of skin: Secondary | ICD-10-CM | POA: Insufficient documentation

## 2016-02-09 DIAGNOSIS — M1990 Unspecified osteoarthritis, unspecified site: Secondary | ICD-10-CM | POA: Insufficient documentation

## 2016-02-09 NOTE — Progress Notes (Addendum)
[name], [name] Visit Report for 02/09/2016 Abuse/Suicide Risk Screen Details Patient Name: [name], [name] Date of Service: 02/09/2016 9:30 AM Medical Record Patient Account Number: [id number] Number: [name]: 07-14-1956 [phone] y.o. [place] Date of Birth/Sex: Female) Other Clinician: Primary Care Physician: [name] Referring Physician: Pilar [name] Physician/Extender: Weeks in Treatment: 0 Abuse/Suicide Risk Screen Items Answer ABUSE/SUICIDE RISK SCREEN: Has anyone close to you tried to hurt or harm you recentlyo No Do you feel uncomfortable with anyone in your familyo No Has anyone forced you do things that you didnot want to doo No Do you have any thoughts of harming yourselfo No Patient displays signs or symptoms of abuse and/or neglect. No Electronic Signature(s) Signed: 02/10/2016 4:34:04 PM By: [name] Previous Signature: 02/09/2016 11:28:49 AM Version By: [name] Entered By: [name] Eric on 02/09/2016 15:35:17 [name], [name] ([id number]) -------------------------------------------------------------------------------- Activities of Daily Living Details Patient Name: [name], [name] Date of Service: 02/09/2016 9:30 AM Medical Record Patient Account Number: [id number] Number: [name]: 1956/10/07 [id number] y.o. [place] Date of Birth/Sex: Female) Other Clinician: Primary Care Physician: [name] Referring Physician: Pilar [name] Physician/Extender: Weeks in Treatment: 0 Activities of Daily Living Items Answer Activities of Daily Living (Please select one for each item) Drive Automobile Completely Able Take Medications Completely Able Use Telephone Completely Able Care for Appearance Completely Able Use Toilet Completely Able Bath / Shower Completely Able Dress Self Completely Able Feed Self Completely  Able Walk Completely Able Get In / Out Bed Completely Able Housework Completely Able Prepare Meals Completely Able Handle Money Completely Able Shop for Self Completely Able Electronic Signature(s) Signed: 02/10/2016 4:34:04 PM By: [name] Previous Signature: 02/09/2016 11:28:49 AM Version By: [name] Entered By: [name] Eric on 02/09/2016 15:35:41 [name], [name] [name] ([id number]) -------------------------------------------------------------------------------- Education Assessment Details Patient Name: [name], [name] Date of Service: 02/09/2016 9:30 AM Medical Record Patient Account Number: [id number] Number: [name]: 28-Sep-1956 [phone] y.o. [place] Date of Birth/Sex: Female) Other Clinician: Primary Care Physician: [name] Referring Physician: Pilar [name] Physician/Extender: Weeks in Treatment: 0 Primary Learner Assessed: Patient Learning Preferences/Education Level/Primary Language Learning Preference: Explanation Highest Education Level: Grade School Preferred Language: English Cognitive Barrier Assessment/Beliefs Language Barrier: No Physical Barrier Assessment Impaired Vision: Yes Glasses Impaired Hearing: Yes Complete Loss Decreased Hand dexterity: No Knowledge/Comprehension Assessment Knowledge Level: High Comprehension Level: High Ability to understand written High instructions: Ability to understand verbal High instructions: Motivation Assessment Anxiety Level: Calm Cooperation: Cooperative Education Importance: Acknowledges Need Interest in Health Problems: Asks Questions Perception: Coherent Willingness to Engage in Self- High Management Activities: Readiness to Engage in Self- High Management Activities: Electronic Signature(s) Signed: 02/10/2016 4:34:04 PM By: [name] Previous Signature: 02/09/2016 11:28:49 AM Version By: [name] Entered By:  [name] Eric on 02/09/2016 15:35:54 [name] ([id number]) Trainer, [name] ([id number]) -------------------------------------------------------------------------------- Fall Risk Assessment Details Patient Name: [name], [name] Date of Service: 02/09/2016 9:30 AM Medical Record Patient Account Number: [id number] Number: [name]: 1956-07-20 [phone] y.o. [place] Date of Birth/Sex: Female) Other Clinician: Primary Care Physician: [name] Referring Physician: Pilar [name] Physician/Extender: Weeks in Treatment: 0 Fall Risk Assessment Items Have you had 2 or more falls in the last 12 monthso 0 No Have you had any fall that resulted in injury in the  last 12 monthso 0 No FALL RISK ASSESSMENT: History of falling - immediate or within 3 months 0 No Secondary diagnosis 0 No Ambulatory aid None/bed rest/wheelchair/nurse 0 Yes Crutches/cane/walker 0 No Furniture 0 No IV Access/Saline Lock 0 No Gait/Training Normal/bed rest/immobile 0 Yes Weak 0 No Impaired 0 No Mental Status Oriented to own ability 0 Yes Electronic Signature(s) Signed: 02/10/2016 4:34:04 PM By: [name] Previous Signature: 02/09/2016 11:28:49 AM Version By: [name] Entered By: [name] Eric on 02/09/2016 15:36:11 [name], [name] ([id number]) -------------------------------------------------------------------------------- Foot Assessment Details Patient Name: [name], [name] Date of Service: 02/09/2016 9:30 AM Medical Record Patient Account Number: [id number] Number: [name]: 06/29/1956 [phone] y.o. [place] Date of Birth/Sex: Female) Other Clinician: Primary Care Physician: [name] Referring Physician: Pilar [name] Physician/Extender: Weeks in Treatment: 0 Foot Assessment Items Site Locations + = Sensation present, - = Sensation absent, C = Callus, U =  Ulcer R = Redness, W = Warmth, M = Maceration, PU = Pre-ulcerative lesion F = Fissure, S = Swelling, D = Dryness Assessment Right: Left: Other Deformity: No No Prior Foot Ulcer: No No Prior Amputation: No No Charcot Joint: No No Ambulatory Status: Ambulatory Without Help Gait: Steady Electronic Signature(s) Signed: 02/10/2016 4:34:04 PM By: [name] Previous Signature: 02/09/2016 11:28:49 AM Version By: [name] Entered By: [name] Eric on 02/09/2016 15:36:38 [name], [name] ([id number]) [name], [name] ([id number]) -------------------------------------------------------------------------------- Nutrition Risk Assessment Details Patient Name: [name], [name] Date of Service: 02/09/2016 9:30 AM Medical Record Patient Account Number: [id number] Number: [name]: 1956/02/15 [phone] y.o. [place] Date of Birth/Sex: Female) Other Clinician: Primary Care Physician: [name] Referring Physician: Pilar [name] Physician/Extender: Weeks in Treatment: 0 Height (in): 60 Weight (lbs): 225 Body Mass Index (BMI): 43.9 Nutrition Risk Assessment Items NUTRITION RISK SCREEN: I have an illness or condition that made me change the kind and/or 0 No amount of food I eat I eat fewer than two meals per day 0 No I eat few fruits and vegetables, or milk products 0 No I have three or more drinks of beer, liquor or wine almost every day 0 No I have tooth or mouth problems that make it hard for me to eat 0 No I don't always have enough money to buy the food I need 0 No I eat alone most of the time 0 No I take three or more different prescribed or over-the-counter drugs a 0 No day Without wanting to, I have lost or gained 10 pounds in the last six 0 No months I am not always physically able to shop, cook and/or feed myself 0 No Nutrition Protocols Good Risk Protocol 0 No interventions needed Moderate Risk  Protocol Electronic Signature(s) Signed: 02/10/2016 4:34:04 PM By: [name] Previous Signature: 02/09/2016 11:28:49 AM Version By: [name] Entered By: [name] Eric on 02/09/2016 15:36:25

## 2016-02-09 NOTE — Progress Notes (Signed)
seeiheal 

## 2016-02-11 NOTE — Progress Notes (Signed)
[name], [name] ([id number]) Visit Report for 02/09/2016 Chief Complaint Document Details Patient Name: [name], [name] Date of Service: 02/09/2016 9:30 AM Medical Record Number: [id number] Patient Account Number: [id number] Date of Birth/Sex: Jun 02, 1956 (60 y.o. Female) Treating RN: [name] Primary Care Physician: [name] Other Clinician: Referring Physician: Pilar [name] Treating Physician/Extender: [name] in Treatment: 0 Information Obtained from: Patient Chief Complaint left lower extremity ulcer Electronic Signature(s) Signed: 02/10/2016 8:22:20 AM By: [name] Sax MD Entered By: [name] on 02/09/2016 15:56:49 [name], [name] ([id number]) -------------------------------------------------------------------------------- Debridement Details Patient Name: Fosse, [name] Date of Service: 02/09/2016 9:30 AM Medical Record Patient Account Number: [id number] Number: Afful, RN, BSN, Treating RN: 07-Jan-1956 [phone] y.o. [place] Sink Date of Birth/Sex: Female) Other Clinician: Primary Care Physician: Ronaldo Miyamoto, [name] Referring Physician: Pilar [name] Physician/Extender: [name] in Treatment: 0 Debridement Performed for Wound #2 Left,Anterior Lower Leg Assessment: Performed By: Physician [name] Sax, MD Debridement: Debridement Pre-procedure Yes Verification/Time Out Taken: Start Time: 10:00 Pain Control: Lidocaine 4% Topical Solution Level: Skin/Subcutaneous Tissue Total Area Debrided (L x 1 (cm) x 1 (cm) = 1 (cm) W): Tissue and other Non-Viable, Fibrin/Slough, Subcutaneous material debrided: Instrument: Curette Bleeding: Minimum Hemostasis Achieved: Pressure End Time: 10:03 Procedural Pain: 0 Post Procedural Pain: 0 Response to Treatment: Procedure was tolerated well Post Debridement Measurements of Total Wound Length: (cm) 1 Width: (cm) 1 Depth: (cm) 0.2 Volume: (cm) 0.157 Post Procedure  Diagnosis Same as Pre-procedure Electronic Signature(s) Signed: 02/09/2016 11:28:49 AM By: [name] BSN, RN Signed: 02/10/2016 8:22:20 AM By: [name] Sax MD Entered By: [name] on 02/09/2016 10:05:19 [name], [name] ([id number]) -------------------------------------------------------------------------------- HPI Details Patient Name: [name], [name] Date of Service: 02/09/2016 9:30 AM Medical Record Number: [id number] Patient Account Number: [id number] Date of Birth/Sex: 01-30-1956 (60 y.o. Female) Treating RN: [name] Primary Care Physician: [name] Other Clinician: Referring Physician: Pilar [name] Treating Physician/Extender: [name] in Treatment: 0 History of Present Illness HPI Description: 42F with h/o lower extremity swelling presents with a left lower extremity ulcer (first presented to [place] on 07/12/14) . She has had it for over 1 year. She has had lower extremity swelling for many years. At one time, she was prescribed compression garments but she could not afford them and has never worn a pair. She does report drainage from the wound. Denies fever. She does have intermittent pain at the ulcer site. She has never had previous ulcers. She denies a history of diabetes or any vascular disease. She presents today for followup for her LLE wound. Denies fevers. She has tolerated her compression garments. She has undergone a venous ablation procedure to her LLE. She also placed a bandage over her LLE wound which has caused skin irritation. Electronic Signature(s) Signed: 02/10/2016 8:22:20 AM By: [name] Sax MD Entered By: [name] on 02/09/2016 10:50:25 [name], [name] ([id number]) -------------------------------------------------------------------------------- Physical Exam Details Patient Name: [name], [name] Date of Service: 02/09/2016 9:30 AM Medical Record Number: [id number] Patient Account Number: [id number] Date of  Birth/Sex: 15-Apr-1956 (60 y.o. Female) Treating RN: [name] Primary Care Physician: [name] Other Clinician: Referring Physician: Pilar [name] Treating Physician/Extender: [name] in Treatment: 0 Electronic Signature(s) Signed: 02/10/2016 8:22:20 AM By: [name] Sax MD Entered By: [name] on 02/09/2016 10:50:34 [name], [name] ([id number]) -------------------------------------------------------------------------------- Physician Orders Details Patient Name: [name], [name] Date of Service: 02/09/2016 9:30 AM Medical Record Patient Account Number: [id number] Number: Afful, RN, BSN, Treating RN: Dec 15, 1956 [phone] y.o. [place] Sink Date  of Birth/Sex: Female) Other Clinician: Primary Care Physician: Skipper Cliche Referring Physician: Pilar [name] Physician/Extender: Tania Ade in Treatment: 0 Verbal / Phone Orders: Yes Clinician: Afful, RN, BSN, Rita Read Back and Verified: Yes Diagnosis Coding Wound Cleansing Wound #2 Left,Anterior Lower Leg o Clean wound with Normal Saline. Anesthetic Wound #2 Left,Anterior Lower Leg o Topical Lidocaine 4% cream applied to wound bed prior to debridement Skin Barriers/Peri-Wound Care Wound #2 Left,Anterior Lower Leg o Barrier cream Primary Wound Dressing Wound #2 Left,Anterior Lower Leg o Aquacel Ag Secondary Dressing Wound #2 Left,Anterior Lower Leg o ABD pad Dressing Change Frequency Wound #2 Left,Anterior Lower Leg o Change dressing every week Follow-up Appointments Wound #2 Left,Anterior Lower Leg o Return Appointment in 1 week. Edema Control Wound #2 Left,Anterior Lower Leg o Unna Boot to Left Lower Extremity o Elevate legs to the level of the heart and pump ankles as often as possible [name], [name] ([id number]) Additional Orders / Instructions Wound #2 Left,Anterior Lower Leg o Increase protein intake. o OK to return to work o  Activity as tolerated Psychologist, prison and probation services) Signed: 02/09/2016 11:28:49 AM By: [name] BSN, RN Signed: 02/10/2016 8:22:20 AM By: [name] Sax MD Entered By: [name] on 02/09/2016 10:08:06 [name], [name] -------------------------------------------------------------------------------- Problem List Details Patient Name: Hackley, [name] Date of Service: 02/09/2016 9:30 AM Medical Record Number: [id number] Patient Account Number: [id number] Date of Birth/Sex: 11-13-1956 (60 y.o. 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 ([id number]) -------------------------------------------------------------------------------- Progress Note Details Patient Name: [name], [name] Date of Service: 02/09/2016 9:30 AM Medical Record Number: [id number] Patient Account Number: [id number] Date of Birth/Sex: [birth year] (60 y.o. Female) Treating RN: [name] Primary Care Physician: [name] Other Clinician: Referring Physician: Pilar [name] Treating Physician/Extender: [name] in Treatment: 0 Subjective Chief Complaint Information obtained from Patient left lower extremity ulcer History of Present Illness (HPI) 30F with h/o lower extremity swelling presents with a left lower extremity ulcer (first presented to [place] on 07/12/14) . She has had it for over 1 year. She has had lower extremity swelling for many years. At one time, she was prescribed  compression garments but she could not afford them and has never worn a pair. She does report drainage from the wound. Denies fever. She does have intermittent pain at the ulcer site. She has never had previous ulcers. She denies a history of diabetes or any vascular disease. She presents today for followup for her LLE wound. Denies fevers. She has tolerated her compression garments. She has undergone a venous ablation procedure to her LLE. She also placed a bandage over her LLE wound which has caused skin irritation. Wound History Patient presents with 1 open wound that has been present for approximately 4months. Patient has been treating wound in the following manner: dry dressing and coban. The wound has been healed in the past but has re-opened. Laboratory tests have not been performed in the last month. Patient reportedly has not tested positive for an antibiotic resistant organism. Patient reportedly has not tested positive for osteomyelitis. Patient reportedly has not had testing performed to evaluate circulation in  the legs. Patient experiences the following problems associated with their wounds: infection, swelling. Patient History Information obtained from Patient. Allergies codiene (Reaction: "stomache burn") Family History Cancer, Diabetes - Mother, Heart Disease - Mother, Father, Hypertension - Mother, Kidney Disease - Mother, Lung Disease - Mother, Stroke - Mother, Thyroid Problems - Mother, No family history of Seizures, Tuberculosis. Social History Former smoker - quit 2005, Marital Status - Divorced, Alcohol Use - Never, Drug Use - No History, Caffeine [place], [name] ([id number]) Use - Daily. Medical History Eyes Denies history of Cataracts, Glaucoma, Optic Neuritis Ear/Nose/Mouth/Throat Patient has history of Middle ear problems Hematologic/Lymphatic Denies history of Anemia, Hemophilia, Human Immunodeficiency Virus, Lymphedema, Sickle Cell  Disease Respiratory Denies history of Aspiration, Asthma, Chronic Obstructive Pulmonary Disease (COPD), Pneumothorax, Sleep Apnea, Tuberculosis Cardiovascular Patient has history of Hypertension, Peripheral Venous Disease Denies history of Angina, Arrhythmia, Congestive Heart Failure, Coronary Artery Disease, Deep Vein Thrombosis, Hypotension, Myocardial Infarction, Peripheral Arterial Disease, Vasculitis Gastrointestinal Denies history of Cirrhosis , Colitis, Crohn s, Hepatitis A, Hepatitis B, Hepatitis C Genitourinary Denies history of End Stage Renal Disease Immunological Denies history of Lupus Erythematosus, Raynaud s, Scleroderma Integumentary (Skin) Denies history of History of Burn, History of pressure wounds Neurologic Denies history of Dementia, Neuropathy, Quadriplegia, Paraplegia, Seizure Disorder Oncologic Denies history of Received Chemotherapy, Received Radiation Psychiatric Denies history of Anorexia/bulimia, Confinement Anxiety Hospitalization/Surgery History - 06/23/2004, [facility] Removal. 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 Eyes Complains or has symptoms of Glasses / Contacts. Ear/Nose/Mouth/Throat The patient has no complaints or symptoms. Hematologic/Lymphatic The patient has no complaints or symptoms. Respiratory The patient has no complaints or symptoms. Cardiovascular The patient has no complaints or symptoms. Gastrointestinal Rey, [name] The patient has no complaints or symptoms. Endocrine The patient has no complaints or symptoms. Genitourinary The patient has no complaints or symptoms. Immunological The patient has no complaints or symptoms. Integumentary (Skin) Complains or has symptoms of Wounds, Breakdown,  Swelling. Musculoskeletal The patient has no complaints or symptoms. Neurologic The patient has no complaints or symptoms. Oncologic The patient has no complaints or symptoms. Psychiatric The patient has no complaints or symptoms. Objective Constitutional Vitals Time Taken: 9:38 AM, Height: 60 in, Source: Stated, Weight: 225 lbs, Source: Stated, BMI: 43.9, Temperature: 97.8 F, Pulse: 50 bpm, Respiratory Rate: 18 breaths/min, Blood Pressure: 146/68 mmHg. Integumentary (Hair, Skin) Wound #2 status is Open. Original cause of wound was Gradually Appeared. The wound is located on the Left,Anterior Lower Leg. The wound measures 1cm length x 1cm width x 0.2cm depth; 0.785cm^2 area and 0.157cm^3 volume. The wound is limited to skin breakdown. There is no tunneling or undermining noted. There is a medium amount of serosanguineous drainage noted. The wound margin is distinct with the outline attached to the wound base. There is small (1-33%) pink, pale granulation within the wound bed. There is a medium (34-66%) amount of necrotic tissue within the wound bed including Adherent Slough. The periwound skin appearance exhibited: Localized Edema, Moist. The periwound skin appearance did not exhibit: Callus, Crepitus, Excoriation, Fluctuance, Friable, Induration, Rash, Scarring, Dry/Scaly, Maceration, Atrophie Blanche, Cyanosis, Ecchymosis, Hemosiderin Staining, Mottled, Pallor, Rubor, Erythema. Periwound temperature was noted as No Abnormality. 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 Compression wrap [name], [name] ([id number]) Procedures Wound #2 Wound #2 is a Venous Leg  Ulcer located on the Left,Anterior Lower Leg . There was a Skin/Subcutaneous Tissue Debridement (40981-19147) debridement with total area of 1 sq cm performed by [name] Sax, MD. with the following instrument(s): Curette to remove Non-Viable tissue/material including Fibrin/Slough  and Subcutaneous after achieving pain control using Lidocaine 4% Topical Solution. A time out was conducted prior to the start of the procedure. A Minimum amount of bleeding was controlled with Pressure. The procedure was tolerated well with a pain level of 0 throughout and a pain level of 0 following the procedure. Post Debridement Measurements: 1cm length x 1cm width x 0.2cm depth; 0.157cm^3 volume. Post procedure Diagnosis Wound #2: Same as Pre-Procedure Plan Wound Cleansing: Wound #2 Left,Anterior Lower Leg: Clean wound with Normal Saline. Anesthetic: Wound #2 Left,Anterior Lower Leg: Topical Lidocaine 4% cream applied to wound bed prior to debridement Skin Barriers/Peri-Wound Care: Wound #2 Left,Anterior Lower Leg: Barrier cream Primary Wound Dressing: Wound #2 Left,Anterior Lower Leg: Aquacel Ag Secondary Dressing: Wound #2 Left,Anterior Lower Leg: ABD pad Dressing Change Frequency: Wound #2 Left,Anterior Lower Leg: Change dressing every week Follow-up Appointments: Wound #2 Left,Anterior Lower Leg: Return Appointment in 1 week. Edema Control: Wound #2 Left,Anterior Lower Leg: Unna Boot to Left Lower Extremity Elevate legs to the level of the heart and pump ankles as often as possible Additional Orders / Instructions: Wound #2 Left,Anterior Lower Leg: [name], [name] ([id number]) Increase protein intake. OK to return to work Activity as tolerated Follow-Up Appointments: A follow-up appointment should be scheduled. Medication Reconciliation completed and provided to Patient/Care Provider. A Patient Clinical Summary of Care was provided to [facility] continue compression and Santyl Electronic Signature(s) Signed: 02/09/2016 4:14:09 PM By: [name] Sax MD Previous Signature: 02/10/2016 8:22:20 AM Version By: [name] Sax MD Entered By: [name] on 02/09/2016 16:14:09 [place], [name]  ([id number]) -------------------------------------------------------------------------------- ROS/PFSH Details Patient Name: [name], [name] Date of Service: 02/09/2016 9:30 AM Medical Record Patient Account Number: [id number] Number: Afful, RN, BSN, Treating RN: [birth year] [phone] y.o. [place] Sink Date of Birth/Sex: Female) Other Clinician: Primary Care Physician: Ronaldo Miyamoto, Danzell Birky Referring Physician: Pilar [name] Physician/Extender: [name] in Treatment: 0 Information Obtained From Patient Wound History Do you currently have one or more open woundso Yes How many open wounds do you currently haveo 1 Approximately how long have you had your woundso 4months How have you been treating your wound(s) until nowo dry dressing and coban Has your wound(s) ever healed and then re-openedo Yes Have you had any lab work done in the past montho No Have you tested positive for an antibiotic resistant organism (MRSA, VRE)o No Have you tested positive for osteomyelitis (bone infection)o No Have you had any tests for circulation on your legso No Have you had other problems associated with your woundso Infection, Swelling Eyes Complaints and Symptoms: Positive for: Glasses / Contacts Medical History: Negative for: Cataracts; Glaucoma; Optic Neuritis Integumentary (Skin) Complaints and Symptoms: Positive for: Wounds; Breakdown; Swelling Medical History: Negative for: History of Burn; History of pressure wounds Constitutional Symptoms (General Health) Complaints and Symptoms: No Complaints or Symptoms Ear/Nose/Mouth/Throat Complaints and Symptoms: No Complaints or Symptoms Mcelhiney, [name] 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) Hematologic/Lymphatic Complaints and Symptoms: No Complaints or Symptoms Medical History: Negative for: Anemia;  Hemophilia; Human Immunodeficiency Virus; Lymphedema; Sickle Cell Disease Respiratory Complaints and Symptoms: No Complaints or Symptoms Medical History: Negative for: Aspiration; Asthma; Chronic Obstructive Pulmonary Disease (  COPD); Pneumothorax; Sleep Apnea; Tuberculosis Cardiovascular Complaints and Symptoms: No Complaints or Symptoms Medical History: Positive for: Hypertension; Peripheral Venous Disease Negative for: Angina; Arrhythmia; Congestive Heart Failure; Coronary Artery Disease; Deep Vein Thrombosis; Hypotension; Myocardial Infarction; Peripheral Arterial Disease; Vasculitis Gastrointestinal Complaints and Symptoms: No Complaints or Symptoms Medical History: Negative for: Cirrhosis ; Colitis; Crohnos; Hepatitis A; Hepatitis B; Hepatitis C Endocrine Complaints and Symptoms: No Complaints or Symptoms Genitourinary Complaints and Symptoms: No Complaints or Symptoms Medical History: Nappier, [name] ([id number]) Negative for: End Stage Renal Disease Immunological Complaints and Symptoms: No Complaints or Symptoms Medical History: Negative for: Lupus Erythematosus; Raynaudos; Scleroderma Musculoskeletal Complaints and Symptoms: No Complaints or Symptoms Medical History: Positive for: Osteoarthritis Neurologic Complaints and Symptoms: No Complaints or Symptoms Medical History: Negative for: Dementia; Neuropathy; Quadriplegia; Paraplegia; Seizure Disorder Oncologic Complaints and Symptoms: No Complaints or Symptoms Medical History: Negative for: Received Chemotherapy; Received Radiation Psychiatric Complaints and Symptoms: No Complaints or Symptoms Medical History: Negative for: Anorexia/bulimia; Confinement Anxiety HBO Extended History Items Ear/Nose/Mouth/Throat: Middle ear problems Hospitalization / Surgery History Name of Hospital Purpose of Hospitalization/Surgery Date [facility] Removal 06/23/2004 Family and Social History Carriker, [name]  ([id number]) Cancer: Yes; Diabetes: Yes - Mother; Heart Disease: Yes - Mother, Father; Hypertension: Yes - Mother; Kidney Disease: Yes - Mother; Lung Disease: Yes - Mother; Seizures: No; Stroke: Yes - Mother; Thyroid Problems: Yes - Mother; Tuberculosis: No; Former smoker - quit 2005; Marital Status - Divorced; Alcohol Use: Never; Drug Use: No History; Caffeine Use: Daily; Financial Concerns: Yes; Food, Clothing or Shelter Needs: No; Support System Lacking: No; Transportation Concerns: No; Advanced Directives: No; Patient does not want information on Advanced Directives; Do not resuscitate: No; Living Will: No; Medical Power of Attorney: No Electronic Signature(s) Signed: 02/10/2016 8:22:20 AM By: [name] Sax MD Signed: 02/10/2016 4:34:04 PM By: [name] BSN, RN Previous Signature: 02/09/2016 11:28:49 AM Version By: [name] BSN, RN Entered By: [name] on 02/09/2016 15:35:04 [place], [name] ([id number]) -------------------------------------------------------------------------------- SuperBill Details Patient Name: [place]oulton, [name] Date of Service: 02/09/2016 Medical Record Number: [id number] Patient Account Number: [id number] Date of Birth/Sex: May 29, 1956 (60 y.o. 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

## 2016-02-11 NOTE — Progress Notes (Addendum)
[name], [name] ([id number]) Visit Report for 02/09/2016 Allergy List Details Patient Name: [name], [name] Date of Service: 02/09/2016 9:30 AM Medical Record Number: [id number] Patient Account Number: [id number] Date of Birth/Sex: 03/14/1956 (60 y.o. Female) Treating RN: Clover Mealy, RN, BSN, [place] Sink Primary Care Physician: Karie Fetch Other Clinician: Referring Physician: Karie Fetch Treating Physician/Extender: Elayne Snare in Treatment: 0 Allergies Active Allergies codeine Reaction: "stomache burn" Allergy Notes Electronic Signature(s) Signed: 02/10/2016 4:34:04 PM By: Elpidio Eric BSN, RN Previous Signature: 02/09/2016 11:28:49 AM Version By: Elpidio Eric BSN, RN Entered By: Elpidio Eric on 02/09/2016 15:34:48 [name], [name] ([id number]) -------------------------------------------------------------------------------- Arrival Information Details Patient Name: [name], [name] Date of Service: 02/09/2016 9:30 AM Medical Record Number: [id number] Patient Account Number: [id number] Date of Birth/Sex: [birth year] (60 y.o. Female) Treating RN: Afful, RN, BSN, [place] Sink Primary Care Physician: Karie Fetch Other Clinician: Referring Physician: Karie Fetch Treating Physician/Extender: Elayne Snare in Treatment: 0 Visit Information Patient Arrived: Ambulatory Arrival Time: 09:34 Accompanied By: self Transfer Assistance: None Patient Identification Verified: Yes Secondary Verification Process Yes Completed: Patient Requires Transmission- No Based Precautions: Patient Has Alerts: Yes Patient Alerts: ABI [place] BILATERAL History Since Last Visit Added or deleted any medications: No Any new allergies or adverse reactions: No Had [name] fall or experienced change in activities of daily living that may affect risk of falls: No Signs or symptoms of abuse/neglect since last visito No Hospitalized since last visit: No Electronic Signature(s) Signed: 08/21/2016 4:07:53 PM  By: Curtis Sites Previous Signature: 02/09/2016 3:23:21 PM Version By: Elpidio Eric BSN, RN Previous Signature: 02/09/2016 11:28:49 AM Version By: Elpidio Eric BSN, RN Entered By: Curtis Sites on 02/24/2016 09:26:36 [name], [name] ([id number]) -------------------------------------------------------------------------------- Clinic Level of Care Assessment Details Patient Name: [name], [name] Date of Service: 02/09/2016 9:30 AM Medical Record Number: [id number] Patient Account Number: [id number] Date of Birth/Sex: [birth year] (60 y.o. Female) Treating RN: Afful, RN, BSN, [place] Sink Primary Care Physician: Karie Fetch Other Clinician: Referring Physician: Karie Fetch Treating Physician/Extender: Elayne Snare in Treatment: 0 Clinic Level of Care Assessment Items TOOL 1 Quantity Score  - Use when EandM and Procedure is performed on INITIAL visit 0 ASSESSMENTS - Nursing Assessment / Reassessment X - General Physical Exam (combine w/ comprehensive assessment (listed just 1 20 below) when performed on new pt. evals) X - Comprehensive Assessment (HX, ROS, Risk Assessments, Wounds Hx, etc.) 1 25 ASSESSMENTS - Wound and Skin Assessment / Reassessment  - Dermatologic / Skin Assessment (not related to wound area) 0 ASSESSMENTS - Ostomy and/or Continence Assessment and Care  - Incontinence Assessment and Management 0  - Ostomy Care Assessment and Management (repouching, etc.) 0 PROCESS - Coordination of Care X - Simple Patient / Family Education for ongoing care 1 15  - Complex (extensive) Patient / Family Education for ongoing care 0 X - Staff obtains Chiropractor, Records, Test Results / Process Orders 1 10  - Staff telephones HHA, Nursing Homes / Clarify orders / etc 0  - Routine Transfer to another Facility (non-emergent condition) 0  - Routine Hospital Admission (non-emergent condition) 0 X - New Admissions / Manufacturing engineer / Ordering NPWT, Apligraf, etc.  1 15  - Emergency Hospital Admission (emergent condition) 0 PROCESS - Special Needs  - Pediatric / Minor Patient Management 0  - Isolation Patient Management 0 [name], [name] ([id number])  - Hearing / Language / Visual special needs 0  - Assessment of Community assistance (transportation, D/C planning, etc.) 0  - Additional assistance / Altered mentation  0  - Support Surface(s) Assessment (bed, cushion, seat, etc.) 0 INTERVENTIONS - Miscellaneous  - External ear exam 0  - Patient Transfer (multiple staff / Nurse, adult / Similar devices) 0  - Simple Staple / Suture removal (25 or less) 0  - Complex Staple / Suture removal (26 or more) 0  - Hypo/Hyperglycemic Management (do not check if billed separately) 0 X - Ankle / Brachial Index (ABI) - do not check if billed separately 1 15 Has the patient been seen at the hospital within the last three years: Yes Total Score: 100 Level Of Care: New/Established - Level 3 Electronic Signature(s) Signed: 02/09/2016 10:54:10 AM By: Elpidio Eric BSN, RN Entered By: Elpidio Eric on 02/09/2016 10:54:10 [name], [name] ([id number]) -------------------------------------------------------------------------------- Encounter Discharge Information Details Patient Name: [name], [name] Date of Service: 02/09/2016 9:30 AM Medical Record Number: [id number] Patient Account Number: [id number] Date of Birth/Sex: 11/05/1956 (60 y.o. Female) Treating RN: Ashok Cordia, Debi Primary Care Physician: Karie Fetch Other Clinician: Referring Physician: Karie Fetch Treating Physician/Extender: Elayne Snare in Treatment: 0 Encounter Discharge Information Items Discharge Pain Level: 0 Discharge Condition: Stable Ambulatory Status: Ambulatory Discharge Destination: Home Transportation: Private Auto self and Accompanied By: interpretor Schedule Follow-up Appointment: Yes Medication Reconciliation completed and provided to  Patient/Care Yes Abrianna Sidman: Provided on Clinical Summary of Care: 02/09/2016 Form Type Recipient Paper Patient GR Electronic Signature(s) Signed: 02/10/2016 8:22:20 AM By: Ardath Sax MD Previous Signature: 02/09/2016 10:22:51 AM Version By: Gwenlyn Perking Entered By: Ardath Sax on 02/09/2016 15:58:47 Greenstein, [name] ([id number]) -------------------------------------------------------------------------------- Lower Extremity Assessment Details Patient Name: Vessey, [name] Date of Service: 02/09/2016 9:30 AM Medical Record Number: [id number] Patient Account Number: [id number] Date of Birth/Sex: 01-21-1956 (59 y.o. Female) Treating RN: Afful, RN, BSN, [place] Primary Care Physician: Karie Fetch Other Clinician: Referring Physician: Karie Fetch Treating Physician/Extender: Elayne Snare in Treatment: 0 Edema Assessment Assessed: [Left: No] [Right: No] Edema: [Left: Ye] [Right: s] Calf Left: Right: Point of Measurement: 33 cm From Medial Instep 52.5 cm cm Ankle Left: Right: Point of Measurement: 9 cm From Medial Instep 22.9 cm cm Vascular Assessment Claudication: Claudication Assessment [Left:None] Pulses: Posterior Tibial Palpable: [Left:Yes] Dorsalis Pedis Palpable: [Left:Yes] Doppler: [Left:Monophasic] Extremity colors, hair growth, and conditions: Extremity Color: [Left:Mottled] Hair Growth on Extremity: [Left:Yes] Temperature of Extremity: [Left:Warm] Capillary Refill: [Left:< 3 seconds] Blood Pressure: Brachial: [Left:146] Toe Nail Assessment Left: Right: Thick: No Discolored: No Deformed: No Improper Length and Hygiene: No Notes Mcmillion, [name] ([id number]) Non compressible. >286mmHg Electronic Signature(s) Signed: 02/09/2016 11:28:49 AM By: Elpidio Eric BSN, RN Entered By: Elpidio Eric on 02/09/2016 09:53:23 Spegal, [name] ([id number]) -------------------------------------------------------------------------------- Multi Wound  Chart Details Patient Name: [name], [name] Date of Service: 02/09/2016 9:30 AM Medical Record Number: [id number] Patient Account Number: [id number] Date of Birth/Sex: January 09, 1956 (59 y.o. 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] [N/[name]:N/[name]] Comorbid History: [2:Middle ear problems, Hypertension, Peripheral Venous Disease, Osteoarthritis] [N/[name]:N/[name]] Date Acquired: [2:11/28/2015] [N/[name]:N/[name]] Weeks of Treatment: [2:0] [N/[name]:N/[name]] Wound Status: [2:Open] [N/[name]:N/[name]] Measurements L x W x D 1x1x0.2 [N/[name]:N/[name]] (cm) Area (cm) : [2:0.785] [N/[name]:N/[name]] Volume (cm) : [2:0.157] [N/[name]:N/[name]] % Reduction in Area: [2:0.00%] [  N/[name]:N/[name]] % Reduction in Volume: 0.00% [N/[name]:N/[name]] Classification: [2:Full Thickness Without Exposed Support Structures] [N/[name]:N/[name]] Exudate Amount: [2:Medium] [N/[name]:N/[name]] Exudate Type: [2:Serosanguineous] [N/[name]:N/[name]] Exudate Color: [2:red, brown] [N/[name]:N/[name]] Wound Margin: [2:Distinct, outline attached] [N/[name]:N/[name]] Granulation Amount: [2:Small (1-33%)] [N/[name]:N/[name]] Granulation Quality: [2:Pink, Pale] [N/[name]:N/[name]] Necrotic Amount: [2:Medium (34-66%)] [N/[name]:N/[name]] Exposed Structures: [2:Fascia: No Fat: No Tendon: No] [N/[name]:N/[name]] Muscle: No Joint: No Bone: No Limited to Skin Breakdown Epithelialization: None N/[name] N/[name] Debridement: Debridement (04540- N/[name] N/[name] 11047) Time-Out Taken: Yes N/[name] N/[name] Pain Control: Lidocaine 4% Topical N/[name] N/[name] Solution Tissue Debrided: Fibrin/Slough, N/[name]  N/[name] Subcutaneous Level: Skin/Subcutaneous N/[name] N/[name] Tissue Debridement Area (sq 1 N/[name] N/[name] cm): Instrument: Curette N/[name] N/[name] Bleeding: Minimum N/[name] N/[name] Hemostasis Achieved: Pressure N/[name] N/[name] Procedural Pain: 0 N/[name] N/[name] Post Procedural Pain: 0 N/[name] N/[name] Debridement Treatment Procedure was tolerated N/[name] N/[name] Response: well Post Debridement 1x1x0.2 N/[name] N/[name] Measurements L x W x D (cm) Post Debridement 0.157 N/[name] N/[name] Volume: (cm) Periwound Skin Texture: Edema: Yes N/[name] N/[name] Excoriation: No Induration: No Callus: No Crepitus: No Fluctuance: No Friable: No Rash: No Scarring: No Periwound Skin [name]: Yes N/[name] N/[name] Moisture: Maceration: No Dry/Scaly: No Periwound Skin Color: Atrophie Blanche: No N/[name] N/[name] Cyanosis: No Ecchymosis: No Erythema: No Hemosiderin Staining: No Mottled: No Pallor: No Rubor: No Temperature: No Abnormality N/[name] N/[name] Vesely, [name] ([id number]) Tenderness on No N/[name] N/[name] Palpation: Wound Preparation: Ulcer Cleansing: N/[name] N/[name] Rinsed/Irrigated with Saline Topical Anesthetic Applied: Other: lidocaine 4% Procedures Performed: Debridement N/[name] N/[name] Treatment Notes Electronic Signature(s) Signed: 02/09/2016 10:17:28 AM By: Elpidio Eric BSN, RN Entered By: Elpidio Eric on 02/09/2016 10:17:28 [name], [name] ([id number]) -------------------------------------------------------------------------------- Multi-Disciplinary Care Plan Details Patient Name: [name], [name] Date of Service: 02/09/2016 9:30 AM Medical Record Number: [id number] Patient Account Number: [id number] Date of Birth/Sex: [birth year] (59 y.o. Female) Treating RN: Afful, RN, BSN, [place] Sink Primary Care Physician: Karie Fetch Other Clinician: Referring Physician: Karie Fetch Treating Physician/Extender: Elayne Snare in Treatment: 0 Active Inactive Orientation to the Wound Care Program Nursing Diagnoses: Knowledge deficit related to the wound healing center  program Goals: Patient/caregiver will verbalize understanding of the Wound Healing Center Program Date Initiated: 02/09/2016 Goal Status: Active Interventions: Provide education on orientation to the wound center Notes: Venous Leg Ulcer Nursing Diagnoses: Knowledge deficit related to disease process and management Potential for venous Insuffiency (use before diagnosis confirmed) Goals: Patient will maintain optimal edema control Date Initiated: 02/09/2016 Goal Status: Active Patient/caregiver will verbalize understanding of disease process and disease management Date Initiated: 02/09/2016 Goal Status: Active Verify adequate tissue perfusion prior to therapeutic compression application Date Initiated: 02/09/2016 Goal Status: Active Interventions: Assess peripheral edema status every visit. Compression as ordered Provide education on venous insufficiency [name], [name] ([id number]) Treatment Activities: Therapeutic compression applied : 02/09/2016 Notes: Wound/Skin Impairment Nursing Diagnoses: Impaired tissue integrity Knowledge deficit related to ulceration/compromised skin integrity Goals: Patient will have [name] decrease in wound volume by X% from date: (specify in notes) Date Initiated: 02/09/2016 Goal Status: Active Patient/caregiver will verbalize understanding of skin care regimen Date Initiated: 02/09/2016 Goal Status: Active Ulcer/skin breakdown will have [name] volume reduction of 30% by week 4 Date Initiated: 02/09/2016 Goal Status: Active Ulcer/skin breakdown will have [name] volume reduction of 50% by week 8 Date Initiated: 02/09/2016 Goal Status: Active Ulcer/skin breakdown will have [name] volume reduction of 80% by week 12 Date Initiated: 02/09/2016 Goal Status: Active Ulcer/skin breakdown will heal within 14 weeks Date Initiated: 02/09/2016 Goal Status: Active Interventions: Assess patient/caregiver ability to obtain necessary supplies Assess patient/caregiver  ability  to perform ulcer/skin care regimen upon admission and as needed Assess ulceration(s) every visit Provide education on ulcer and skin care Notes: Electronic Signature(s) Signed: 02/09/2016 10:16:52 AM By: Elpidio Eric BSN, RN Entered By: Elpidio Eric on 02/09/2016 10:16:52 [name], [name] ([id number]) -------------------------------------------------------------------------------- Pain Assessment Details Patient Name: [name], [name] Date of Service: 02/09/2016 9:30 AM Medical Record Number: [id number] Patient Account Number: [id number] Date of Birth/Sex: [birth year] (59 y.o. Female) Treating RN: Afful, RN, BSN, [place] Sink Primary Care Physician: Karie Fetch Other Clinician: Referring Physician: Karie Fetch Treating Physician/Extender: Elayne Snare in Treatment: 0 Active Problems Location of Pain Severity and Description of Pain Patient Has Paino No Site Locations Pain Management and Medication Current Pain Management: Electronic Signature(s) Signed: 02/10/2016 4:34:04 PM By: Elpidio Eric BSN, RN Previous Signature: 02/09/2016 11:28:49 AM Version By: Elpidio Eric BSN, RN Entered By: Elpidio Eric on 02/09/2016 15:34:05 [name], [name] ([id number]) -------------------------------------------------------------------------------- Patient/Caregiver Education Details Patient Name: Amberg, [name] Date of Service: 02/09/2016 9:30 AM Medical Record Number: [id number] Patient Account Number: [id number] Date of Birth/Gender: 1956/03/15 (60 y.o. Female) Treating RN: Ashok Cordia, Debi Primary Care Physician: Karie Fetch Other Clinician: Referring Physician: Karie Fetch Treating Physician/Extender: Elayne Snare in Treatment: 0 Education Assessment Education Provided To: Patient Education Topics Provided Venous: Handouts: Controlling Swelling with Compression Stockings Methods: Demonstration, Explain/Verbal Responses: State content correctly Welcome To The W[facility]: Handouts: Welcome To The [facility] Methods: Demonstration, Explain/Verbal Responses: State content correctly Wound/Skin Impairment: Handouts: Other: do not get wrap wet Methods: Demonstration, Explain/Verbal Responses: State content correctly Electronic Signature(s) Signed: 02/10/2016 8:22:20 AM By: Ardath Sax MD Entered By: Ardath Sax on 02/09/2016 15:58:57 [name], [name] ([id number]) -------------------------------------------------------------------------------- Wound Assessment Details Patient Name: Bartolucci, [name] Date of Service: 02/09/2016 9:30 AM Medical Record Number: [id number] Patient Account Number: [id number] Date of Birth/Sex: Oct 16, 1956 (59 y.o. Female) Treating RN: Afful, RN, BSN, [place] Sink Primary Care Physician: Karie Fetch Other Clinician: Referring Physician: Karie Fetch Treating Physician/Extender: Elayne Snare in Treatment: 0 Wound Status Wound Number: 2 Primary Venous Leg Ulcer Etiology: Wound Location: Left Lower Leg - Anterior Wound Open Wounding Event: Gradually Appeared Status: Date Acquired: 11/28/2015 Comorbid Middle ear problems, Hypertension, Weeks Of Treatment: 0 History: Peripheral Venous Disease, Clustered Wound: No Osteoarthritis Photos Photo Uploaded By: Alejandro Mulling on 02/09/2016 11:38:07 Wound Measurements Length: (cm) 1 Width: (cm) 1 Depth: (cm) 0.2 Area: (cm) 0.785 Volume: (cm) 0.157 % Reduction in Area: 0% % Reduction in Volume: 0% Epithelialization: None Tunneling: No Undermining: No Wound Description Full Thickness Without Exposed Classification: Support Structures Wound Margin: Distinct, outline attached Exudate Medium Amount: Exudate Type: Serosanguineous Exudate Color: red, brown Foul Odor After Cleansing: No Wound Bed Granulation Amount: Small (1-33%) Exposed Structure Granulation Quality: Pink, Pale Fascia Exposed: No Kreps, [name]  ([id number]) Necrotic Amount: Medium (34-66%) Fat Layer Exposed: No Necrotic Quality: Adherent Slough Tendon Exposed: No Muscle Exposed: No Joint Exposed: No Bone Exposed: No Limited to Skin Breakdown Periwound Skin Texture Texture Color No Abnormalities Noted: No No Abnormalities Noted: No Callus: No Atrophie Blanche: No Crepitus: No Cyanosis: No Excoriation: No Ecchymosis: No Fluctuance: No Erythema: No Friable: No Hemosiderin Staining: No Induration: No Mottled: No Localized Edema: Yes Pallor: No Rash: No Rubor: No Scarring: No Temperature / Pain Moisture Temperature: No Abnormality No Abnormalities Noted: No Dry / Scaly: No Maceration: No [name]: Yes Wound Preparation Ulcer Cleansing: Rinsed/Irrigated with Saline Topical Anesthetic Applied: Other: lidocaine 4%, Electronic Signature(s) Signed: 02/09/2016 11:28:49 AM By: Elpidio Eric  BSN, RN Entered By: Elpidio Eric on 02/09/2016 09:49:52 Adkison, [name] ([id number]) -------------------------------------------------------------------------------- Vitals Details Patient Name: Sayre, [name] Date of Service: 02/09/2016 9:30 AM Medical Record Number: [id number] Patient Account Number: [id number] Date of Birth/Sex: Feb 27, 1956 (59 y.o. Female) Treating RN: Afful, RN, BSN, [place] Sink Primary Care Physician: Karie Fetch Other Clinician: Referring Physician: Karie Fetch Treating Physician/Extender: Elayne Snare in Treatment: 0 Vital Signs Time Taken: 09:38 Temperature (F): 97.8 Height (in): 60 Pulse (bpm): 50 Source: Stated Respiratory Rate (breaths/min): 18 Weight (lbs): 225 Blood Pressure (mmHg): 146/68 Source: Stated Reference Range: 80 - 120 mg / dl Body Mass Index (BMI): 43.9 Electronic Signature(s) Signed: 02/10/2016 4:34:04 PM By: Elpidio Eric BSN, RN Previous Signature: 02/09/2016 11:28:49 AM Version By: Elpidio Eric BSN, RN Entered By: Elpidio Eric on 02/09/2016 15:34:15

## 2016-02-17 ENCOUNTER — Ambulatory Visit: Payer: BLUE CROSS/BLUE SHIELD | Admitting: Surgery

## 2016-02-24 ENCOUNTER — Encounter: Payer: BLUE CROSS/BLUE SHIELD | Attending: Surgery | Admitting: Surgery

## 2016-02-24 DIAGNOSIS — Z87891 Personal history of nicotine dependence: Secondary | ICD-10-CM | POA: Diagnosis not present

## 2016-02-24 DIAGNOSIS — I83222 Varicose veins of left lower extremity with both ulcer of calf and inflammation: Secondary | ICD-10-CM | POA: Diagnosis present

## 2016-02-24 DIAGNOSIS — H9193 Unspecified hearing loss, bilateral: Secondary | ICD-10-CM | POA: Diagnosis not present

## 2016-02-24 DIAGNOSIS — L97222 Non-pressure chronic ulcer of left calf with fat layer exposed: Secondary | ICD-10-CM | POA: Diagnosis not present

## 2016-02-24 DIAGNOSIS — M1990 Unspecified osteoarthritis, unspecified site: Secondary | ICD-10-CM | POA: Insufficient documentation

## 2016-02-24 DIAGNOSIS — I10 Essential (primary) hypertension: Secondary | ICD-10-CM | POA: Diagnosis not present

## 2016-02-25 NOTE — Progress Notes (Signed)
Jamil, [name] ([id number]) Visit Report for 02/24/2016 Arrival Information Details Patient Name: [name], [name] Date of Service: 02/24/2016 8:45 AM Medical Record Number: [id number] Patient Account Number: [id number] Date of Birth/Sex: 1955-12-26 (60 y.o. Female) Treating RN: Curtis Sitesorthy, Joanna Primary Care Physician: Karie FetchAYCOCK, NGWE Other Clinician: Referring Physician: Karie FetchAYCOCK, NGWE Treating Physician/Extender: Rudene ReBritto, Errol Weeks in Treatment: 2 Visit Information History Since Last Visit Added or deleted any medications: No Patient Arrived: Ambulatory Any new allergies or adverse reactions: No Arrival Time: 08:50 Had a fall or experienced change in No Accompanied By: self activities of daily living that may affect Transfer Assistance: None risk of falls: Patient Identification Verified: Yes Signs or symptoms of abuse/neglect since last No Secondary Verification Process Yes visito Completed: Hospitalized since last visit: No Patient Requires Transmission- No Pain Present Now: No Based Precautions: Patient Has Alerts: Yes Patient Alerts: [facility] [place] BILATERAL Electronic Signature(s) Signed: 02/24/2016 5:40:45 PM By: Curtis Sitesorthy, Joanna Entered By: Curtis Sitesorthy, Joanna on 02/24/2016 09:28:51 [name], [name] ([id number]) -------------------------------------------------------------------------------- Encounter Discharge Information Details Patient Name: [name], [name] Date of Service: 02/24/2016 8:45 AM Medical Record Number: [id number] Patient Account Number: [id number] Date of Birth/Sex: 1955-12-26 (60 y.o. Female) Treating RN: Curtis Sitesorthy, Joanna Primary Care Physician: Karie FetchAYCOCK, NGWE Other Clinician: Referring Physician: Karie FetchAYCOCK, NGWE Treating Physician/Extender: Rudene ReBritto, Errol Weeks in Treatment: 2 Encounter Discharge Information Items Discharge Pain Level: 0 Discharge Condition: Stable Ambulatory Status: Ambulatory Discharge Destination: Home Transportation: Private  Auto Accompanied By: self Schedule Follow-up Appointment: Yes Medication Reconciliation completed and provided to Patient/Care No Latham Kinzler: Provided on Clinical Summary of Care: 02/24/2016 Form Type Recipient Paper Patient GR Electronic Signature(s) Signed: 02/24/2016 11:01:54 AM By: Curtis Sitesorthy, Joanna Previous Signature: 02/24/2016 9:51:16 AM Version By: Gwenlyn PerkingMoore, Shelia Entered By: Curtis Sitesorthy, Joanna on 02/24/2016 11:01:54 [name], [name] ([id number]) -------------------------------------------------------------------------------- Lower Extremity Assessment Details Patient Name: Desir, [name] Date of Service: 02/24/2016 8:45 AM Medical Record Number: [id number] Patient Account Number: [id number] Date of Birth/Sex: 1955-12-26 (60 y.o. Female) Treating RN: Curtis Sitesorthy, Joanna Primary Care Physician: Karie FetchAYCOCK, NGWE Other Clinician: Referring Physician: Karie FetchAYCOCK, NGWE Treating Physician/Extender: Rudene ReBritto, Errol Weeks in Treatment: 2 Edema Assessment Assessed: [Left: No] [Right: No] Edema: [Left: Ye] [Right: s] Calf Left: Right: Point of Measurement: 33 cm From Medial Instep 52 cm cm Ankle Left: Right: Point of Measurement: 9 cm From Medial Instep 22.5 cm cm Vascular Assessment Pulses: Posterior Tibial Dorsalis Pedis Palpable: [Left:Yes] Extremity colors, hair growth, and conditions: Extremity Color: [Left:Hyperpigmented] Hair Growth on Extremity: [Left:No] Temperature of Extremity: [Left:Warm] Capillary Refill: [Left:< 3 seconds] Electronic Signature(s) Signed: 02/24/2016 5:40:45 PM By: Curtis Sitesorthy, Joanna Entered By: Curtis Sitesorthy, Joanna on 02/24/2016 09:00:46 [name], [name] ([id number]) -------------------------------------------------------------------------------- Multi Wound Chart Details Patient Name: Santino, [name] Date of Service: 02/24/2016 8:45 AM Medical Record Number: [id number] Patient Account Number: [id number] Date of Birth/Sex: 1955-12-26 (60 y.o. 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] [N/A:N/A] Comorbid History: [2:Middle ear problems, Hypertension, Peripheral Venous Disease, Osteoarthritis] [N/A:N/A] Date Acquired: [2:11/28/2015] [N/A:N/A] Weeks of Treatment: [2:2] [N/A:N/A] Wound Status: [2:Open] [N/A:N/A] Measurements L x W x D 0.3x0.5x0.1 [N/A:N/A] (cm) Area (cm) : [2:0.118] [N/A:N/A] Volume (cm) : [2:0.012] [N/A:N/A] % Reduction in Area: [2:85.00%] [N/A:N/A] %  Reduction in Volume: 92.40% [N/A:N/A] Classification: [2:Full Thickness Without Exposed Support Structures] [N/A:N/A] Exudate Amount: [2:Medium] [N/A:N/A] Exudate Type: [2:Serosanguineous] [N/A:N/A] Exudate Color: [2:red, brown] [N/A:N/A] Wound Margin: [2:Distinct, outline attached] [N/A:N/A] Granulation Amount: [2:Large (67-100%)] [N/A:N/A] Granulation Quality: [2:Red] [N/A:N/A] Necrotic Amount: [2:Small (1-33%)] [N/A:N/A] Exposed Structures: [2:Fascia: No Fat: No Tendon: No] [N/A:N/A] Muscle: No Joint: No Bone: No Limited to Skin Breakdown Epithelialization: None N/A N/A Periwound Skin Texture: Edema: Yes N/A N/A Excoriation: No Induration: No Callus: No Crepitus: No Fluctuance: No Friable: No Rash: No Scarring: No Periwound Skin Moist: Yes N/A N/A Moisture: Maceration: No Dry/Scaly: No Periwound Skin Color: Atrophie Blanche: No N/A N/A Cyanosis: No Ecchymosis: No Erythema: No Hemosiderin Staining: No Mottled: No Pallor: No Rubor: No Temperature: No  Abnormality N/A N/A Tenderness on No N/A N/A Palpation: Wound Preparation: Ulcer Cleansing: Other: N/A N/A soap and water Topical Anesthetic Applied: Other: lidocaine 4% Treatment Notes Electronic Signature(s) Signed: 02/24/2016 5:40:45 PM By: Curtis Sites Entered By: Curtis Sites on 02/24/2016 09:11:18 Montanye, [place] AMarland Kitchen ([id number]) -------------------------------------------------------------------------------- Multi-Disciplinary Care Plan Details Patient Name: [name], [name] Date of Service: 02/24/2016 8:45 AM Medical Record Number: [id number] Patient Account Number: [id number] Date of Birth/Sex: September 18, 1956 (60 y.o. Female) Treating RN: Curtis Sites Primary Care Physician: Karie Fetch Other Clinician: Referring Physician: Karie Fetch Treating Physician/Extender: Rudene Re in Treatment: 2 Active Inactive Orientation to the Wound Care Program Nursing Diagnoses: Knowledge deficit related to the wound healing center program Goals: Patient/caregiver will verbalize understanding of the Wound Healing Center Program Date Initiated: 02/09/2016 Goal Status: Active Interventions: Provide education on orientation to the wound center Notes: Venous Leg Ulcer Nursing Diagnoses: Knowledge deficit related to disease process and management Potential for venous Insuffiency (use before diagnosis confirmed) Goals: Patient will maintain optimal edema control Date Initiated: 02/09/2016 Goal Status: Active Patient/caregiver will verbalize understanding of disease process and disease management Date Initiated: 02/09/2016 Goal Status: Active Verify adequate tissue perfusion prior to therapeutic compression application Date Initiated: 02/09/2016 Goal Status: Active Interventions: Assess peripheral edema status every visit. Compression as ordered Provide education on venous insufficiency Pellegrini, [name] ([id number]) Treatment Activities: Therapeutic compression  applied : 02/24/2016 Notes: Wound/Skin Impairment Nursing Diagnoses: Impaired tissue integrity Knowledge deficit related to ulceration/compromised skin integrity Goals: Patient will have a decrease in wound volume by X% from date: (specify in notes) Date Initiated: 02/09/2016 Goal Status: Active Patient/caregiver will verbalize understanding of skin care regimen Date Initiated: 02/09/2016 Goal Status: Active Ulcer/skin breakdown will have a volume reduction of 30% by week 4 Date Initiated: 02/09/2016 Goal Status: Active Ulcer/skin breakdown will have a volume reduction of 50% by week 8 Date Initiated: 02/09/2016 Goal Status: Active Ulcer/skin breakdown will have a volume reduction of 80% by week 12 Date Initiated: 02/09/2016 Goal Status: Active Ulcer/skin breakdown will heal within 14 weeks Date Initiated: 02/09/2016 Goal Status: Active Interventions: Assess patient/caregiver ability to obtain necessary supplies Assess patient/caregiver ability to perform ulcer/skin care regimen upon admission and as needed Assess ulceration(s) every visit Provide education on ulcer and skin care Notes: Electronic Signature(s) Signed: 02/24/2016 5:40:45 PM By: Curtis Sites Entered By: Curtis Sites on 02/24/2016 09:08:53 Botz, [place] AMarland Kitchen ([id number]) -------------------------------------------------------------------------------- Patient/Caregiver Education Details Patient Name: Areola, [name] Date of Service: 02/24/2016 8:45 AM Medical Record Number: [id number] Patient Account Number: [id number] Date of Birth/Gender: 1956-10-15 (60 y.o. Female) Treating RN: Curtis Sites Primary Care Physician: Karie Fetch Other Clinician: Referring Physician: Karie Fetch Treating Physician/Extender: Rudene Re in Treatment: 2 Education Assessment Education Provided To: Patient Education Topics  Provided Venous: Handouts: Other: need for compression hose Methods:  Explain/Verbal Responses: State content correctly Electronic Signature(s) Signed: 02/24/2016 11:02:13 AM By: Curtis Sites Entered By: Curtis Sites on 02/24/2016 11:02:13 Mcclain, [name] ([id number]) -------------------------------------------------------------------------------- Wound Assessment Details Patient Name: Yokoyama, [name] Date of Service: 02/24/2016 8:45 AM Medical Record Number: [id number] Patient Account Number: [id number] Date of Birth/Sex: December 13, 1956 (60 y.o. Female) Treating RN: Curtis Sites Primary Care Physician: Karie Fetch Other Clinician: Referring Physician: Karie Fetch Treating Physician/Extender: Rudene Re in Treatment: 2 Wound Status Wound Number: 2 Primary Venous Leg Ulcer Etiology: Wound Location: Left Lower Leg - Anterior Wound Open Wounding Event: Gradually Appeared Status: Date Acquired: 11/28/2015 Comorbid Middle ear problems, Hypertension, Weeks Of Treatment: 2 History: Peripheral Venous Disease, Clustered Wound: No Osteoarthritis Photos Photo Uploaded By: Curtis Sites on 02/24/2016 16:34:12 Wound Measurements Length: (cm) 0.3 Width: (cm) 0.5 Depth: (cm) 0.1 Area: (cm) 0.118 Volume: (cm) 0.012 % Reduction in Area: 85% % Reduction in Volume: 92.4% Epithelialization: None Tunneling: No Undermining: No Wound Description Full Thickness Without Exposed Classification: Support Structures Wound Margin: Distinct, outline attached Exudate Medium Amount: Exudate Type: Serosanguineous Exudate Color: red, brown Foul Odor After Cleansing: No Wound Bed Granulation Amount: Large (67-100%) Exposed Structure Granulation Quality: Red Fascia Exposed: No Bossi, [name] ([id number]) Necrotic Amount: Small (1-33%) Fat Layer Exposed: No Necrotic Quality: Adherent Slough Tendon Exposed: No Muscle Exposed: No Joint Exposed: No Bone Exposed: No Limited to Skin Breakdown Periwound Skin Texture Texture  Color No Abnormalities Noted: No No Abnormalities Noted: No Callus: No Atrophie Blanche: No Crepitus: No Cyanosis: No Excoriation: No Ecchymosis: No Fluctuance: No Erythema: No Friable: No Hemosiderin Staining: No Induration: No Mottled: No Localized Edema: Yes Pallor: No Rash: No Rubor: No Scarring: No Temperature / Pain Moisture Temperature: No Abnormality No Abnormalities Noted: No Dry / Scaly: No Maceration: No Moist: Yes Wound Preparation Ulcer Cleansing: Other: soap and water, Topical Anesthetic Applied: Other: lidocaine 4%, Treatment Notes Wound #2 (Left, Anterior Lower Leg) 1. Cleansed with: Cleanse wound with antibacterial soap and water 2. Anesthetic Topical Lidocaine 4% cream to wound bed prior to debridement 4. Dressing Applied: Aquacel Ag 5. Secondary Dressing Applied ABD Pad 7. Secured with 2 Layer Lite Compression System - Left Lower Extremity Notes kerlix and coban from toes to 3cm below the knee Electronic Signature(s) Signed: 02/24/2016 5:40:45 PM By: Dyke Brackett, [name] ([id number]) Entered By: Curtis Sites on 02/24/2016 09:08:46 Blass, [name] ([id number]) -------------------------------------------------------------------------------- Vitals Details Patient Name: Heldt, [name] Date of Service: 02/24/2016 8:45 AM Medical Record Number: [id number] Patient Account Number: [id number] Date of Birth/Sex: [birth year] (60 y.o. Female) Treating RN: Curtis Sites Primary Care Physician: Karie Fetch Other Clinician: Referring Physician: Karie Fetch Treating Physician/Extender: Rudene Re in Treatment: 2 Vital Signs Time Taken: 08:53 Temperature (F): 98.4 Height (in): 60 Pulse (bpm): 52 Weight (lbs): 225 Respiratory Rate (breaths/min): 18 Body Mass Index (BMI): 43.9 Blood Pressure (mmHg): 176/74 Reference Range: 80 - 120 mg / dl Electronic Signature(s) Signed: 02/24/2016 5:40:45 PM By: Curtis Sites Entered By: Curtis Sites on 02/24/2016 08:56:21

## 2016-02-25 NOTE — Progress Notes (Signed)
Hollis, [name] ([id number]) Visit Report for 02/24/2016 Chief Complaint Document Details Patient Name: [name], [name] Date of Service: 02/24/2016 8:45 [name] Medical Record Number: [id number] Patient Account Number: [id number] Date of Birth/Sex: [birth year] (60 y.o. Female) Treating RN: Curtis Sitesorthy, Joanna Primary Care Physician: Karie FetchAYCOCK, NGWE Other Clinician: Referring Physician: Karie FetchAYCOCK, NGWE Treating Physician/Extender: Rudene ReBritto, Twan Harkin Weeks in Treatment: 2 Information Obtained from: Patient Chief Complaint Patient presents for treatment of an open ulcer due to venous insufficiency which she's had for several months Electronic Signature(s) Signed: 02/24/2016 9:49:54 [name] By: Evlyn KannerBritto, Srijan Givan MD, FACS Entered By: Evlyn KannerBritto, Ieesha Abbasi on 02/24/2016 09:49:54 [name], [name] -------------------------------------------------------------------------------- Debridement Details Patient Name: [name], [name] Date of Service: 02/24/2016 8:45 [name] Medical Record Number: [id number] Patient Account Number: [id number] Date of Birth/Sex: [birth year] (60 y.o. Female) Treating RN: Curtis Sitesorthy, Joanna Primary Care Physician: Karie FetchAYCOCK, NGWE Other Clinician: Referring Physician: Karie FetchAYCOCK, NGWE Treating Physician/Extender: Rudene ReBritto, Fedrick Cefalu Weeks in Treatment: 2 Debridement Performed for Wound #2 Left,Anterior Lower Leg Assessment: Performed By: Physician Evlyn KannerBritto, Mads Borgmeyer, MD Debridement: Debridement Pre-procedure Yes Verification/Time Out Taken: Start Time: 09:26 Pain Control: Lidocaine 4% Topical Solution Level: Skin/Subcutaneous Tissue Total Area Debrided (L x 0.3 (cm) x 0.5 (cm) = 0.15 (cm) W): Tissue and other Viable, Non-Viable, Eschar, Fibrin/Slough, Subcutaneous material debrided: Instrument: Forceps Bleeding: Minimum Hemostasis Achieved: Pressure End Time: 09:29 Procedural Pain: 0 Post Procedural Pain: 0 Response to Treatment: Procedure was tolerated well Post Debridement Measurements of  Total Wound Length: (cm) 0.3 Width: (cm) 0.5 Depth: (cm) 0.1 Volume: (cm) 0.012 Post Procedure Diagnosis Same as Pre-procedure Electronic Signature(s) Signed: 02/24/2016 9:53:45 [name] By: Evlyn KannerBritto, Kreg Earhart MD, FACS Signed: 02/24/2016 5:40:45 PM By: Curtis Sitesorthy, Joanna Entered By: Evlyn KannerBritto, Audrie Kuri on 02/24/2016 09:53:45 [name], [name] -------------------------------------------------------------------------------- HPI Details Patient Name: Cart, [name] Date of Service: 02/24/2016 8:45 [name] Medical Record Number: [id number] Patient Account Number: [id number] Date of Birth/Sex: [birth year] (60 y.o. Female) Treating RN: Curtis Sitesorthy, Joanna Primary Care Physician: Karie FetchAYCOCK, NGWE Other Clinician: Referring Physician: Karie FetchAYCOCK, NGWE Treating Physician/Extender: Rudene ReBritto, Rayana Geurin Weeks in Treatment: 2 History of Present Illness HPI Description: this patient was been previously seen at the wound center is being seen by me for the first time today. 58F with h/o lower extremity swelling presents with a left lower extremity ulcer (first presented to us on 07/12/14) . the patient is deaf and lip reads and is fairly conversant. She has had venous ablation in 2015 but due to lack of insurance and money she has been unable to follow-up with the vascular surgeons not she been wearing compression garments. Does not have a past medical history of diabetes or other issues. He has not been wearing compression stockings and we did try to get her Velcro wraps but she is unable to afford the copayment. Addendum: I have found some reports from August 2015 when an arterial study was done and the left lower extremity had triphasic waveform showing no arterial insufficiency. She was also to found to have left greater saphenous vein reflux throughout with tortuous veins and on 08/27/2014 she had a laser ablation done. At a follow-up on September 8 it was found that she needed a second procedure as a first  procedure was not completely successful. Due to the patient's insurance issues she has not gone back for the second procedure. Electronic Signature(s) Signed: 02/24/2016 1:06:17 PM By: Evlyn KannerBritto, Kleber Crean MD, FACS Previous Signature: 02/24/2016 9:52:19 [name] Version By: Evlyn KannerBritto, Mirabella Hilario MD, FACS Entered By: Evlyn KannerBritto, Lilymae Swiech on 02/24/2016 13:06:17 [name], [name] ([id number]) -------------------------------------------------------------------------------- Physical Exam Details Patient  Name: [name], [name] Date of Service: 02/24/2016 8:45 [name] Medical Record Number: [id number] Patient Account Number: [id number] Date of Birth/Sex: [birth year] (60 y.o. Female) Treating RN: Curtis Sites Primary Care Physician: Karie Fetch Other Clinician: Referring Physician: Karie Fetch Treating Physician/Extender: Rudene Re in Treatment: 2 Constitutional . Pulse regular. Respirations normal and unlabored. Afebrile. . Eyes Nonicteric. Reactive to light. Ears, Nose, Mouth, and Throat Lips, teeth, and gums WNL.[name] Kitchen Moist mucosa without lesions. Neck supple and nontender. No palpable supraclavicular or cervical adenopathy. Normal sized without goiter. Respiratory WNL. No retractions.. Breath sounds WNL, No rubs, rales, rhonchi, or wheeze.. Cardiovascular Pedal Pulses WNL. ABI could not be measured as she is noncompressible. she has got significant sick matter of venous disease with palpable large varicosities near the ankle and cough.. Lymphatic No adneopathy. No adenopathy. No adenopathy. Musculoskeletal Adexa without tenderness or enlargement.. Digits and nails w/o clubbing, cyanosis, infection, petechiae, ischemia, or inflammatory conditions.. Integumentary (Hair, Skin) No suspicious lesions. No crepitus or fluctuance. No peri-wound warmth or erythema. No masses.[name] Kitchen Psychiatric Judgement and insight Intact.. No evidence of depression, anxiety, or agitation.. Notes as noted she has stigmata of venous  disease with skin changes and ulceration on the left calf. She has also got large palpable varicosities in her lower extremity and ankle region. Electronic Signature(s) Signed: 02/24/2016 9:53:32 [name] By: Evlyn Kanner MD, FACS Entered By: Evlyn Kanner on 02/24/2016 09:53:32 Virnig, [name] -------------------------------------------------------------------------------- Physician Orders Details Patient Name: [name], [name] Date of Service: 02/24/2016 8:45 [name] Medical Record Number: [id number] Patient Account Number: [id number] Date of Birth/Sex: [birth year] (60 y.o. Female) Treating RN: Curtis Sites Primary Care Physician: Karie Fetch Other Clinician: Referring Physician: Karie Fetch Treating Physician/Extender: Rudene Re in Treatment: 2 Verbal / Phone Orders: Yes Clinician: Curtis Sites Read Back and Verified: Yes Diagnosis Coding Wound Cleansing Wound #2 Left,Anterior Lower Leg o Cleanse wound with mild soap and water Anesthetic Wound #2 Left,Anterior Lower Leg o Topical Lidocaine 4% cream applied to wound bed prior to debridement Skin Barriers/Peri-Wound Care Wound #2 Left,Anterior Lower Leg o Barrier cream Primary Wound Dressing Wound #2 Left,Anterior Lower Leg o Aquacel Ag Secondary Dressing Wound #2 Left,Anterior Lower Leg o ABD pad Dressing Change Frequency Wound #2 Left,Anterior Lower Leg o Change dressing every week Follow-up Appointments Wound #2 Left,Anterior Lower Leg o Return Appointment in 1 week. Edema Control Wound #2 Left,Anterior Lower Leg o 2 Layer Lite Compression System - Left Lower Extremity - kerlix and coban o Elevate legs to the level of the heart and pump ankles as often as possible Additional Orders / Instructions Staley, [name] ([id number]) Wound #2 Left,Anterior Lower Leg o Increase protein intake. o OK to return to work o Activity as tolerated Investment banker, corporate) Signed: 02/24/2016 1:09:41 PM By: Curtis Sites Signed: 02/24/2016 4:29:10 PM By: Evlyn Kanner MD, FACS Entered By: Curtis Sites on 02/24/2016 13:09:40 Hemphill, [name] ([id number]) -------------------------------------------------------------------------------- Problem List Details Patient Name: Varghese, [name] Date of Service: 02/24/2016 8:45 [name] Medical Record Number: [id number] Patient Account Number: [id number] Date of Birth/Sex: 1956/05/08 (60 y.o. 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 ([id number]) -------------------------------------------------------------------------------- Progress Note Details Patient Name: Zinger, [name] Date of Service: 02/24/2016 8:45 [name] Medical Record Number: [id number] Patient Account Number: [id number] Date of Birth/Sex: Nov 24, 1956 (60 y.o. Female) Treating RN: Curtis Sites Primary Care Physician: Karie Fetch Other Clinician: Referring Physician: Karie Fetch Treating Physician/Extender: Rudene Re in Treatment: 2 Subjective Chief Complaint Information obtained from Patient Patient presents for treatment of an open ulcer due to venous insufficiency which she's had for several months History of Present Illness (HPI) this patient was been previously seen at the wound center is being seen by me for the first time  today. 53F with h/o lower extremity swelling presents with a left lower extremity ulcer (first presented to [place] on 07/12/14) . the patient is deaf and lip reads and is fairly conversant. She has had venous ablation in 2015 but due to lack of insurance and money she has been unable to follow-up with the vascular surgeons not she been wearing compression garments. Does not have a past medical history of diabetes or other issues. He has not been wearing compression stockings and we did try to get her Velcro wraps but she is unable to afford the copayment. Addendum: I have found some reports from August 2015 when an arterial study was done and the left lower extremity had triphasic waveform showing no arterial insufficiency. She was also to found to have left greater saphenous vein reflux throughout with tortuous veins and on 08/27/2014 she had a laser ablation done. At a follow-up on September 8 it was found that she needed a second procedure as a first procedure was not completely successful. Due to the patient's insurance issues she has not gone back for the second procedure. Objective Constitutional Pulse regular. Respirations normal and unlabored. Afebrile. Vitals Time Taken: 8:53 [name], Height: 60 in, Weight: 225 lbs, BMI: 43.9, Temperature: 98.4 F, Pulse: 52 bpm, Respiratory Rate: 18 breaths/min, Blood Pressure: 176/74 mmHg. Eyes Nonicteric. Reactive to light. Olenick, [name] ([id number]) Ears, Nose, Mouth, and Throat Lips, teeth, and gums WNL.[name] Kitchen Moist mucosa without lesions. Neck supple and nontender. No palpable supraclavicular or cervical adenopathy. Normal sized without goiter. Respiratory WNL. No retractions.. Breath sounds WNL, No rubs, rales, rhonchi, or wheeze.. Cardiovascular Pedal Pulses WNL. ABI could not be measured as she is noncompressible. she has got significant sick matter of venous disease with palpable large varicosities near the ankle and cough.. Lymphatic No  adneopathy. No adenopathy. No adenopathy. Musculoskeletal Adexa without tenderness or enlargement.. Digits and nails w/o clubbing, cyanosis, infection, petechiae, ischemia, or inflammatory conditions.[name] Kitchen Psychiatric Judgement and insight Intact.. No evidence of depression, anxiety, or agitation.. General Notes: as noted she has stigmata of venous disease with skin changes and ulceration on the left calf. She has also got large palpable varicosities in her lower extremity and ankle region. Integumentary (Hair, Skin) No suspicious lesions. No crepitus or fluctuance. No peri-wound warmth or erythema. No masses.. Wound #2 status is Open. Original cause of wound was Gradually Appeared. The wound is located on the Left,Anterior Lower Leg. The wound measures 0.3cm length x 0.5cm width x 0.1cm depth; 0.118cm^2 area and 0.012cm^3 volume. The wound is limited to skin breakdown. There is no tunneling or undermining noted. There is a medium amount of serosanguineous drainage noted. The wound margin is distinct with the outline attached to the wound base. There is large (67-100%) red granulation within the wound bed. There is a small (1-33%) amount  of necrotic tissue within the wound bed including Adherent Slough. The periwound skin appearance exhibited: Localized Edema, Moist. The periwound skin appearance did not exhibit: Callus, Crepitus, Excoriation, Fluctuance, Friable, Induration, Rash, Scarring, Dry/Scaly, Maceration, Atrophie Blanche, Cyanosis, Ecchymosis, Hemosiderin Staining, Mottled, Pallor, Rubor, Erythema. Periwound temperature was noted as No Abnormality. Assessment Active Problems ICD-10 [name], [name] 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 At this stage she has a need for arterial duplex studies to be done as the blood vessels are noncompressible and she does need follow-up with the vascular surgeons. He is unable to buy compression stockings because of her prohibitive copayment. Recommended alternatives as far as compression goes and have urged her to our office for charity care or to see one [facility] the larger [facility] to see if they will do her treatment with the present insurance she has. He is also urged to follow-up with her previous surgeon as they may be able to work with her with payments plan. She fully understands that we will continue to support her to the best of viability and today I have recommended silver alginate and a 2 layer compression. Procedures Wound #2 Wound #2 is a Venous Leg Ulcer located on the Left,Anterior Lower Leg . There was a Skin/Subcutaneous Tissue Debridement ([id number]) debridement with total area of 0.15 sq cm performed by Evlyn Kanner, MD. with the following instrument(s): Forceps to remove Viable and Non-Viable tissue/material including Fibrin/Slough, Eschar, and Subcutaneous after achieving pain control using Lidocaine 4% Topical Solution. A time out was conducted prior to the start of the procedure. A Minimum amount of bleeding was controlled with Pressure. The procedure was tolerated well with a pain level of 0 throughout and a pain level of 0 following the procedure. Post Debridement Measurements: 0.3cm length x 0.5cm width x 0.1cm depth; 0.012cm^3 volume. Post procedure Diagnosis Wound #2: Same as Pre-Procedure Plan Wound Cleansing: Wound #2 Left,Anterior Lower Leg: Cleanse wound with mild soap and water Anesthetic: Wound #2 Left,Anterior Lower Leg: Paye, [name] ([id number]) Topical Lidocaine 4% cream applied to wound bed prior to debridement Skin  Barriers/Peri-Wound Care: Wound #2 Left,Anterior Lower Leg: Barrier cream Primary Wound Dressing: Wound #2 Left,Anterior Lower Leg: Aquacel Ag Secondary Dressing: Wound #2 Left,Anterior Lower Leg: ABD pad Dressing Change Frequency: Wound #2 Left,Anterior Lower Leg: Change dressing every week Follow-up Appointments: Wound #2 Left,Anterior Lower Leg: Return Appointment in 1 week. Edema Control: Wound #2 Left,Anterior Lower Leg: 2 Layer Lite Compression System - Left Lower Extremity - kerlix and coban Elevate legs to the level of the heart and pump ankles as often as possible Additional Orders / Instructions: Wound #2 Left,Anterior Lower Leg: Increase protein intake. OK to return to work Activity as tolerated This 60 year old patient with significant venous disease has been unable to take good care of her lower extremity with compression and follow-up surgery due to the lack of money and insurance. At this stage she has a need for arterial duplex studies to be done as the blood vessels are noncompressible and she does need follow-up with the vascular surgeons. He is unable to buy compression stockings because of her prohibitive copayment. Recommended alternatives as far as compression goes and  have urged her to our office for charity care or to see one of the larger university hospitals nearby to see if they will do her treatment with the present insurance she has. He is also urged to follow-up with her previous surgeon as they may be able to work with her with payments plan. She fully understands that we will continue to support her to the best of viability and today I have recommended silver alginate and a 2 layer compression. Electronic Signature(s) [name], [name] ([id number]) Signed: 02/24/2016 4:30:57 PM By: Evlyn Kanner MD, FACS Previous Signature: 02/24/2016 1:06:39 PM Version By: Evlyn Kanner MD, FACS Previous Signature: 02/24/2016 9:56:03 [name] Version By: Evlyn Kanner  MD, FACS Entered By: Evlyn Kanner on 02/24/2016 16:30:56 [name], [name] AMarland Kitchen ([id number]) -------------------------------------------------------------------------------- SuperBill Details Patient Name: [name], [name] Date of Service: 02/24/2016 Medical Record Number: [id number] Patient Account Number: [id number] Date of Birth/Sex: 12-27-1955 (60 y.o. 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

## 2016-03-02 ENCOUNTER — Encounter: Payer: BLUE CROSS/BLUE SHIELD | Admitting: Surgery

## 2016-03-02 DIAGNOSIS — I83222 Varicose veins of left lower extremity with both ulcer of calf and inflammation: Secondary | ICD-10-CM | POA: Diagnosis not present

## 2016-03-03 NOTE — Progress Notes (Signed)
Cantara, [name] ([id number]) Visit Report for 03/02/2016 Chief Complaint Document Details Patient Name: [name], [name] Date of Service: 03/02/2016 9:15 AM Medical Record Number: [id number] Patient Account Number: [id number] Date of Birth/Sex: 01-29-1956 (59 y.o. Female) Treating RN: Curtis Sitesorthy, Joanna Primary Care Physician: Karie FetchAYCOCK, NGWE Other Clinician: Referring Physician: Karie FetchAYCOCK, NGWE Treating Physician/Extender: Rudene ReBritto, Titilayo [name] Weeks in Treatment: 3 Information Obtained from: Patient Chief Complaint Patient presents for treatment of an open ulcer due to venous insufficiency which she's had for several months Electronic Signature(s) Signed: 03/02/2016 9:55:12 AM By: Evlyn KannerBritto, Vegas Coffin MD, FACS Entered By: Evlyn KannerBritto, Ladasia Sircy on 03/02/2016 09:55:12 [name], [name] -------------------------------------------------------------------------------- Debridement Details Patient Name: [name], [name] Date of Service: 03/02/2016 9:15 AM Medical Record Number: [id number] Patient Account Number: [id number] Date of Birth/Sex: 01-29-1956 (59 y.o. Female) Treating RN: Curtis Sitesorthy, Joanna Primary Care Physician: Karie FetchAYCOCK, NGWE Other Clinician: Referring Physician: Karie FetchAYCOCK, NGWE Treating Physician/Extender: Rudene ReBritto, Tiari Andringa Weeks in Treatment: 3 Debridement Performed for Wound #2 Left,Anterior Lower Leg Assessment: Performed By: Physician Evlyn KannerBritto, Kurtiss Wence, MD Debridement: Debridement Pre-procedure Yes Verification/Time Out Taken: Start Time: 09:45 Pain Control: Lidocaine 4% Topical Solution Level: Skin/Subcutaneous Tissue Total Area Debrided (L x 0.3 (cm) x 0.4 (cm) = 0.12 (cm) W): Tissue and other Viable, Non-Viable, Eschar, Fibrin/Slough, Subcutaneous material debrided: Instrument: Forceps Bleeding: None End Time: 09:46 Procedural Pain: 0 Post Procedural Pain: 0 Response to Treatment: Procedure was tolerated well Post Debridement Measurements of Total Wound Length: (cm)  0.3 Width: (cm) 0.4 Depth: (cm) 0.1 Volume: (cm) 0.009 Post Procedure Diagnosis Same as Pre-procedure Electronic Signature(s) Signed: 03/02/2016 9:55:06 AM By: Evlyn KannerBritto, Margaurite Salido MD, FACS Signed: 03/02/2016 7:01:59 PM By: Curtis Sitesorthy, Joanna Entered By: Evlyn KannerBritto, Alanda Colton on 03/02/2016 09:55:06 [name], [name] -------------------------------------------------------------------------------- HPI Details Patient Name: [name], [name] Date of Service: 03/02/2016 9:15 AM Medical Record Number: [id number] Patient Account Number: [id number] Date of Birth/Sex: 01-29-1956 (59 y.o. Female) Treating RN: Curtis Sitesorthy, Joanna Primary Care Physician: Karie FetchAYCOCK, NGWE Other Clinician: Referring Physician: Karie FetchAYCOCK, NGWE Treating Physician/Extender: Rudene ReBritto, Shaneece Stockburger Weeks in Treatment: 3 History of Present Illness HPI Description: this patient was been previously seen at the wound center is being seen by me for the first time today. 49F with h/o lower extremity swelling presents with a left lower extremity ulcer (first presented to us on 07/12/14) . the patient is deaf and lip reads and is fairly conversant. She has had venous ablation in 2015 but due to lack of insurance and money she has been unable to follow-up with the vascular surgeons not she been wearing compression garments. Does not have a past medical history of diabetes or other issues. He has not been wearing compression stockings and we did try to get her Velcro wraps but she is unable to afford the copayment. Addendum: I have found some reports from August 2015 when an arterial study was done and the left lower extremity had triphasic waveform showing no arterial insufficiency. She was also to found to have left greater saphenous vein reflux throughout with tortuous veins and on 08/27/2014 she had a laser ablation done. At a follow-up on September 8 it was found that she needed a second procedure as a first procedure was not completely  successful. Due to the patient's insurance issues she has not gone back for the second procedure. 03/02/2016 -- he is here with the interpreter today and we have had a thorough discussion about all the above and all questions answered Electronic Signature(s) Signed: 03/02/2016 9:55:39 AM By: Evlyn KannerBritto, Hurshell Dino MD, FACS Entered By: Evlyn KannerBritto, Brayden Betters on 03/02/2016  09:55:38 [name], [name] ([id number]) -------------------------------------------------------------------------------- Physical Exam Details Patient Name: Doell, [name] Date of Service: 03/02/2016 9:15 AM Medical Record Number: [id number] Patient Account Number: [id number] Date of Birth/Sex: 06/24/1956 (59 y.o. Female) Treating RN: Curtis Sites Primary Care Physician: Karie Fetch Other Clinician: Referring Physician: Karie Fetch Treating Physician/Extender: Rudene Re in Treatment: 3 Constitutional . Pulse regular. Respirations normal and unlabored. Afebrile. . Eyes Nonicteric. Reactive to light. Ears, Nose, Mouth, and Throat Lips, teeth, and gums WNL.Marland Kitchen Moist mucosa without lesions. Neck supple and nontender. No palpable supraclavicular or cervical adenopathy. Normal sized without goiter. Respiratory WNL. No retractions.. Cardiovascular Pedal Pulses WNL. she has stage I lymphedema. Chest Breasts symmetical and no nipple discharge.. Breast tissue WNL, no masses, lumps, or tenderness.. Lymphatic No adneopathy. No adenopathy. No adenopathy. Musculoskeletal Adexa without tenderness or enlargement.. Digits and nails w/o clubbing, cyanosis, infection, petechiae, ischemia, or inflammatory conditions.. Integumentary (Hair, Skin) No suspicious lesions. No crepitus or fluctuance. No peri-wound warmth or erythema. No masses.Marland Kitchen Psychiatric Judgement and insight Intact.. No evidence of depression, anxiety, or agitation.. Notes there continues to be a lot of lymphedema and stigmata of venous hypertension. Actual  ulceration is looking better and I sharply debrided with a toothed forcep and the subcutaneous tissue looks good. No bleeding Electronic Signature(s) Signed: 03/02/2016 9:56:32 AM By: Evlyn Kanner MD, FACS Entered By: Evlyn Kanner on 03/02/2016 09:56:32 [name], [name] ([id number]) -------------------------------------------------------------------------------- Physician Orders Details Patient Name: [name], [name] Date of Service: 03/02/2016 9:15 AM Medical Record Number: [id number] Patient Account Number: [id number] Date of Birth/Sex: 02-Nov-1956 (59 y.o. Female) Treating RN: Curtis Sites Primary Care Physician: Karie Fetch Other Clinician: Referring Physician: Karie Fetch Treating Physician/Extender: Rudene Re in Treatment: 3 Verbal / Phone Orders: Yes Clinician: Curtis Sites Read Back and Verified: Yes Diagnosis Coding Wound Cleansing Wound #2 Left,Anterior Lower Leg o Cleanse wound with mild soap and water Anesthetic Wound #2 Left,Anterior Lower Leg o Topical Lidocaine 4% cream applied to wound bed prior to debridement Skin Barriers/Peri-Wound Care Wound #2 Left,Anterior Lower Leg o Barrier cream Primary Wound Dressing Wound #2 Left,Anterior Lower Leg o Non-adherent pad Dressing Change Frequency Wound #2 Left,Anterior Lower Leg o Change dressing every week Follow-up Appointments Wound #2 Left,Anterior Lower Leg o Return Appointment in 1 week. Edema Control Wound #2 Left,Anterior Lower Leg o 4-Layer Compression System - Left Lower Extremity o Elevate legs to the level of the heart and pump ankles as often as possible Additional Orders / Instructions Wound #2 Left,Anterior Lower Leg o Increase protein intake. o OK to return to work o Activity as tolerated [name], [name] ([id number]) Electronic Signature(s) Signed: 03/02/2016 3:47:35 PM By: Evlyn Kanner MD, FACS Signed: 03/02/2016 7:01:59 PM By: Curtis Sites Entered By: Curtis Sites on 03/02/2016 09:47:11 Lampert, [name] ([id number]) -------------------------------------------------------------------------------- Problem List Details Patient Name: Palardy, [name] Date of Service: 03/02/2016 9:15 AM Medical Record Number: [id number] Patient Account Number: [id number] Date of Birth/Sex: 02-21-1956 (59 y.o. 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 ([id number]) -------------------------------------------------------------------------------- Progress Note Details Patient  Name: [name], [name] Date of Service: 03/02/2016 9:15 AM Medical Record Number: [id number] Patient Account Number: [id number] Date of Birth/Sex: [birth year] (59 y.o. Female) Treating RN: Curtis Sites Primary Care Physician: Karie Fetch Other Clinician: Referring Physician: Karie Fetch Treating Physician/Extender: Rudene Re in Treatment: 3 Subjective Chief Complaint Information obtained from Patient Patient presents for treatment of an open ulcer due to venous insufficiency which she's had for several months History of Present Illness (HPI) this patient was been previously seen at the wound center is being seen by me for the first time today. 33F with h/o lower extremity swelling presents with a left lower extremity ulcer (first presented to [place]  on 07/12/14) . the patient is deaf and lip reads and is fairly conversant. She has had venous ablation in 2015 but due to lack of insurance and money she has been unable to follow-up with the vascular surgeons not she been wearing compression garments. Does not have a past medical history of diabetes or other issues. He has not been wearing compression stockings and we did try to get her Velcro wraps but she is unable to afford the copayment. Addendum: I have found some reports from August 2015 when an arterial study was done and the left lower extremity had triphasic waveform showing no arterial insufficiency. She was also to found to have left greater saphenous vein reflux throughout with tortuous veins and on 08/27/2014 she had a laser ablation done. At a follow-up on September 8 it was found that she needed a second procedure as a first procedure was not completely successful. Due to the patient's insurance issues she has not gone back for the second procedure. 03/02/2016 -- he is here with the interpreter today and we have had a thorough discussion about all the above and all questions answered Objective Constitutional Pulse regular. Respirations normal and unlabored. Afebrile. Vitals Time Taken: 9:24 AM, Height: 60 in, Weight: 225 lbs, BMI: 43.9, Temperature: 98.3 F, Pulse: 55 bpm, Respiratory Rate: 18 breaths/min, Blood Pressure: 161/96 mmHg. Goldberger, [name] ([id number]) Eyes Nonicteric. Reactive to light. Ears, Nose, Mouth, and Throat Lips, teeth, and gums WNL.Marland Kitchen Moist mucosa without lesions. Neck supple and nontender. No palpable supraclavicular or cervical adenopathy. Normal sized without goiter. Respiratory WNL. No retractions.. Cardiovascular Pedal Pulses WNL. she has stage I lymphedema. Chest Breasts symmetical and no nipple discharge.. Breast tissue WNL, no masses, lumps, or tenderness.. Lymphatic No adneopathy. No adenopathy. No  adenopathy. Musculoskeletal Adexa without tenderness or enlargement.. Digits and nails w/o clubbing, cyanosis, infection, petechiae, ischemia, or inflammatory conditions.Marland Kitchen Psychiatric Judgement and insight Intact.. No evidence of depression, anxiety, or agitation.. General Notes: there continues to be a lot of lymphedema and stigmata of venous hypertension. Actual ulceration is looking better and I sharply debrided with a toothed forcep and the subcutaneous tissue looks good. No bleeding Integumentary (Hair, Skin) No suspicious lesions. No crepitus or fluctuance. No peri-wound warmth or erythema. No masses.. Wound #2 status is Open. Original cause of wound was Gradually Appeared. The wound is located on the Left,Anterior Lower Leg. The wound measures 0.3cm length x 0.4cm width x 0.1cm depth; 0.094cm^2 area and 0.009cm^3 volume. The wound is limited to skin breakdown. There is no tunneling or undermining noted. There is a medium amount of serosanguineous drainage noted. The wound margin is distinct with the outline attached to the wound base. There is no granulation within the wound bed. There is a large (67- 100%) amount of necrotic tissue within the wound bed including Adherent  Slough. The periwound skin appearance exhibited: Localized Edema, Moist. The periwound skin appearance did not exhibit: Callus, Crepitus, Excoriation, Fluctuance, Friable, Induration, Rash, Scarring, Dry/Scaly, Maceration, Atrophie Blanche, Cyanosis, Ecchymosis, Hemosiderin Staining, Mottled, Pallor, Rubor, Erythema. Periwound temperature was noted as No Abnormality. [name], [name] 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 There was a  Skin/Subcutaneous Tissue Debridement ([id number]) debridement with total area of 0.12 sq cm performed by Evlyn Kanner, MD. with the following instrument(s): Forceps to remove Viable and Non-Viable tissue/material including Fibrin/Slough, Eschar, and Subcutaneous after achieving pain control using Lidocaine 4% Topical Solution. A time out was conducted prior to the start of the procedure. There was no bleeding. The procedure was tolerated well with a pain level of 0 throughout and a pain level of 0 following the procedure. Post Debridement Measurements: 0.3cm length x 0.4cm width x 0.1cm depth; 0.009cm^3 volume. Post procedure Diagnosis Wound #2: Same as Pre-Procedure Plan Wound Cleansing: Wound #2 Left,Anterior Lower Leg: Cleanse wound with mild soap and water Anesthetic: Wound #2 Left,Anterior Lower Leg: Topical Lidocaine 4% cream applied to wound bed prior to debridement Skin Barriers/Peri-Wound Care: Wound #2 Left,Anterior Lower Leg: Barrier cream Primary Wound Dressing: Wound #2 Left,Anterior Lower Leg: Non-adherent pad Dressing Change Frequency: Wound #2 Left,Anterior Lower Leg: Change dressing every week Follow-up Appointments: Wound #2 Left,Anterior Lower Leg: [name], [name] ([id number]) Return Appointment in 1 week. Edema Control: Wound #2 Left,Anterior Lower Leg: 4-Layer Compression System - Left Lower Extremity Elevate legs to the level of the heart and pump ankles as often as possible Additional Orders / Instructions: Wound #2 Left,Anterior Lower Leg: Increase protein intake. OK to return to work Activity as tolerated This 60 year old patient with significant venous disease has been unable to take good care of her lower extremity with compression and follow-up surgery due to the lack of money and insurance. Having reviewed her arterial duplex studies I have recommended the use a 4-layer Profore wrap. She is unable to buy compression stockings because of her  prohibitive copayment -- Recommended alternatives as far as compression goes and have urged her to our office for charity care or to see one [facility] the larger [facility] nearby to see if they will do her treatment with the present insurance she has. She is also urged to follow-up with her previous surgeon as they may be able to work with her with payments plan. She fully understands that we will continue to support her to the best of our ablity. Electronic Signature(s) Signed: 03/02/2016 9:58:19 AM By: Evlyn Kanner MD, FACS Entered By: Evlyn Kanner on 03/02/2016 09:58:19 [name], [name] ([id number]) -------------------------------------------------------------------------------- SuperBill Details Patient Name: [name], [name] Date of Service: 03/02/2016 Medical Record Number: [id number] Patient Account Number: [id number] Date of Birth/Sex: Aug 02, 1956 (60 y.o. 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

## 2016-03-03 NOTE — Progress Notes (Signed)
[name], [name] ([id number]) Visit Report for 03/02/2016 Arrival Information Details Patient Name: Beal, [name] Date of Service: 03/02/2016 9:15 AM Medical Record Number: [id number] Patient Account Number: [id number] Date of Birth/Sex: [birth year] (59 y.o. Female) Treating RN: Curtis Sites Primary Care Physician: Karie Fetch Other Clinician: Referring Physician: Karie Fetch Treating Physician/Extender: Rudene Re in Treatment: 3 Visit Information History Since Last Visit Added or deleted any medications: No Patient Arrived: Ambulatory Any new allergies or adverse reactions: No Arrival Time: 09:22 Had a fall or experienced change in No Accompanied By: interpreter activities of daily living that may affect Transfer Assistance: None risk of falls: Patient Identification Verified: Yes Signs or symptoms of abuse/neglect since last No Secondary Verification Process Yes visito Completed: Hospitalized since last visit: No Patient Requires Transmission- No Pain Present Now: No Based Precautions: Patient Has Alerts: Yes Patient Alerts: ABI  BILATERAL Electronic Signature(s) Signed: 03/02/2016 7:01:59 PM By: Curtis Sites Entered By: Curtis Sites on 03/02/2016 09:23:16 [name], [name] ([id number]) -------------------------------------------------------------------------------- Encounter Discharge Information Details Patient Name: [name], [name] Date of Service: 03/02/2016 9:15 AM Medical Record Number: [id number] Patient Account Number: [id number] Date of Birth/Sex: 1956-02-16 (59 y.o. Female) Treating RN: Curtis Sites Primary Care Physician: Karie Fetch Other Clinician: Referring Physician: Karie Fetch Treating Physician/Extender: Rudene Re in Treatment: 3 Encounter Discharge Information Items Schedule Follow-up Appointment: No Medication Reconciliation completed No and provided to Patient/Care Seara Hinesley: Provided  on Clinical Summary of Care: 03/02/2016 Form Type Recipient Paper Patient GR Electronic Signature(s) Signed: 03/02/2016 10:12:10 AM By: Francie Massing Entered By: Francie Massing on 03/02/2016 10:12:10 Omara, [name] ([id number]) -------------------------------------------------------------------------------- Lower Extremity Assessment Details Patient Name: Hereford, [name] Date of Service: 03/02/2016 9:15 AM Medical Record Number: [id number] Patient Account Number: [id number] Date of Birth/Sex: [birth year] (59 y.o. Female) Treating RN: Curtis Sites Primary Care Physician: Karie Fetch Other Clinician: Referring Physician: Karie Fetch Treating Physician/Extender: Rudene Re in Treatment: 3 Edema Assessment Assessed: [Left: No] [Right: No] Edema: [Left: Ye] [Right: s] Calf Left: Right: Point of Measurement: 33 cm From Medial Instep 52 cm cm Ankle Left: Right: Point of Measurement: 9 cm From Medial Instep 22.5 cm cm Vascular Assessment Pulses: Posterior Tibial Dorsalis Pedis Palpable: [Left:Yes] Extremity colors, hair growth, and conditions: Extremity Color: [Left:Hyperpigmented] Hair Growth on Extremity: [Left:Yes] Temperature of Extremity: [Left:Warm] Capillary Refill: [Left:< 3 seconds] Electronic Signature(s) Signed: 03/02/2016 7:01:59 PM By: Curtis Sites Entered By: Curtis Sites on 03/02/2016 09:36:17 [name], [name] ([id number]) -------------------------------------------------------------------------------- Multi Wound Chart Details Patient Name: Markwardt, [name] Date of Service: 03/02/2016 9:15 AM Medical Record Number: [id number] Patient Account Number: [id number] Date of Birth/Sex: 12-24-1956 (59 y.o. 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] [N/A:N/A] Comorbid History: [2:Middle ear problems, Hypertension, Peripheral Venous Disease, Osteoarthritis] [N/A:N/A] Date Acquired: [2:11/28/2015] [N/A:N/A] Weeks of Treatment: [2:3] [N/A:N/A] Wound Status: [2:Open] [N/A:N/A] Measurements L x W x D 0.3x0.4x0.1 [N/A:N/A] (cm) Area (cm) : [2:0.094] [N/A:N/A] Volume (cm) : [2:0.009] [N/A:N/A] % Reduction in Area: [2:88.00%] [N/A:N/A] % Reduction in Volume: 94.30% [N/A:N/A] Classification: [2:Full Thickness Without Exposed Support Structures] [N/A:N/A] Exudate Amount: [2:Medium] [N/A:N/A] Exudate Type: [2:Serosanguineous] [N/A:N/A] Exudate Color: [2:red, brown] [N/A:N/A] Wound Margin: [  2:Distinct, outline attached] [N/A:N/A] Granulation Amount: [2:None Present (0%)] [N/A:N/A] Necrotic Amount: [2:Large (67-100%)] [N/A:N/A] Exposed Structures: [2:Fascia: No Fat: No Tendon: No Muscle: No] [N/A:N/A] Joint: No Bone: No Limited to Skin Breakdown Epithelialization: None N/A N/A Periwound Skin Texture: Edema: Yes N/A N/A Excoriation: No Induration: No Callus: No Crepitus: No Fluctuance: No Friable: No Rash: No Scarring: No Periwound Skin Moist: Yes N/A N/A Moisture: Maceration: No Dry/Scaly: No Periwound Skin Color: Atrophie Blanche: No N/A N/A Cyanosis: No Ecchymosis: No Erythema: No Hemosiderin Staining: No Mottled: No Pallor: No Rubor: No Temperature: No Abnormality N/A N/A Tenderness on No N/A N/A Palpation: Wound Preparation: Ulcer Cleansing: Other: N/A N/A soap and water Topical Anesthetic Applied: Other: lidocaine 4% Treatment Notes Electronic Signature(s) Signed: 03/02/2016 7:01:59 PM By:  Curtis Sitesorthy, Joanna Entered By: Curtis Sitesorthy, Joanna on 03/02/2016 09:42:00 [name], [name] ([id number]) -------------------------------------------------------------------------------- Multi-Disciplinary Care Plan Details Patient Name: [name] BeamEIMILLER, [name] Date of Service: 03/02/2016 9:15 AM Medical Record Number: [id number] Patient Account Number: [id number] Date of Birth/Sex: [birth year] (59 y.o. Female) Treating RN: Curtis Sitesorthy, Joanna Primary Care Physician: Karie FetchAYCOCK, NGWE Other Clinician: Referring Physician: Karie FetchAYCOCK, NGWE Treating Physician/Extender: Rudene ReBritto, Errol Weeks in Treatment: 3 Active Inactive Orientation to the Wound Care Program Nursing Diagnoses: Knowledge deficit related to the wound healing center program Goals: Patient/caregiver will verbalize understanding of the Wound Healing Center Program Date Initiated: 02/09/2016 Goal Status: Active Interventions: Provide education on orientation to the wound center Notes: Venous Leg Ulcer Nursing Diagnoses: Knowledge deficit related to disease process and management Potential for venous Insuffiency (use before diagnosis confirmed) Goals: Patient will maintain optimal edema control Date Initiated: 02/09/2016 Goal Status: Active Patient/caregiver will verbalize understanding of disease process and disease management Date Initiated: 02/09/2016 Goal Status: Active Verify adequate tissue perfusion prior to therapeutic compression application Date Initiated: 02/09/2016 Goal Status: Active Interventions: Assess peripheral edema status every visit. Compression as ordered Provide education on venous insufficiency [name], [name] ([id number]) Treatment Activities: Therapeutic compression applied : 03/02/2016 Notes: Wound/Skin Impairment Nursing Diagnoses: Impaired tissue integrity Knowledge deficit related to ulceration/compromised skin integrity Goals: Patient will have a decrease in wound volume by X% from date: (specify in  notes) Date Initiated: 02/09/2016 Goal Status: Active Patient/caregiver will verbalize understanding of skin care regimen Date Initiated: 02/09/2016 Goal Status: Active Ulcer/skin breakdown will have a volume reduction of 30% by week 4 Date Initiated: 02/09/2016 Goal Status: Active Ulcer/skin breakdown will have a volume reduction of 50% by week 8 Date Initiated: 02/09/2016 Goal Status: Active Ulcer/skin breakdown will have a volume reduction of 80% by week 12 Date Initiated: 02/09/2016 Goal Status: Active Ulcer/skin breakdown will heal within 14 weeks Date Initiated: 02/09/2016 Goal Status: Active Interventions: Assess patient/caregiver ability to obtain necessary supplies Assess patient/caregiver ability to perform ulcer/skin care regimen upon admission and as needed Assess ulceration(s) every visit Provide education on ulcer and skin care Notes: Electronic Signature(s) Signed: 03/02/2016 7:01:59 PM By: Curtis Sitesorthy, Joanna Entered By: Curtis Sitesorthy, Joanna on 03/02/2016 09:41:44 [name], [name] -------------------------------------------------------------------------------- Wound Assessment Details Patient Name: [name], [name] Date of Service: 03/02/2016 9:15 AM Medical Record Number: [id number] Patient Account Number: [id number] Date of Birth/Sex: [birth year] (59 y.o. Female) Treating RN: Curtis Sitesorthy, Joanna Primary Care Physician: Karie FetchAYCOCK, NGWE Other Clinician: Referring Physician: Karie FetchAYCOCK, NGWE Treating Physician/Extender: Rudene ReBritto, Errol Weeks in Treatment: 3 Wound Status Wound Number: 2 Primary Venous Leg Ulcer Etiology: Wound Location: Left Lower Leg - Anterior Wound Open Wounding Event: Gradually Appeared Status: Date Acquired: 11/28/2015 Comorbid Middle ear problems, Hypertension, Weeks Of Treatment: 3 History: Peripheral Venous  Disease, Clustered Wound: No Osteoarthritis Photos Photo Uploaded By: Curtis Sites on 03/02/2016 11:47:40 Wound  Measurements Length: (cm) 0.3 Width: (cm) 0.4 Depth: (cm) 0.1 Area: (cm) 0.094 Volume: (cm) 0.009 % Reduction in Area: 88% % Reduction in Volume: 94.3% Epithelialization: None Tunneling: No Undermining: No Wound Description Full Thickness Without Exposed Classification: Support Structures Wound Margin: Distinct, outline attached Exudate Medium Amount: Exudate Type: Serosanguineous Exudate Color: red, brown Foul Odor After Cleansing: No Wound Bed Granulation Amount: None Present (0%) Exposed Structure Necrotic Amount: Large (67-100%) Fascia Exposed: No [name], [name] ([id number]) Necrotic Quality: Adherent Slough Fat Layer Exposed: No Tendon Exposed: No Muscle Exposed: No Joint Exposed: No Bone Exposed: No Limited to Skin Breakdown Periwound Skin Texture Texture Color No Abnormalities Noted: No No Abnormalities Noted: No Callus: No Atrophie Blanche: No Crepitus: No Cyanosis: No Excoriation: No Ecchymosis: No Fluctuance: No Erythema: No Friable: No Hemosiderin Staining: No Induration: No Mottled: No Localized Edema: Yes Pallor: No Rash: No Rubor: No Scarring: No Temperature / Pain Moisture Temperature: No Abnormality No Abnormalities Noted: No Dry / Scaly: No Maceration: No Moist: Yes Wound Preparation Ulcer Cleansing: Other: soap and water, Topical Anesthetic Applied: Other: lidocaine 4%, Electronic Signature(s) Signed: 03/02/2016 7:01:59 PM By: Curtis Sites Entered By: Curtis Sites on 03/02/2016 09:41:35 [name], [name] -------------------------------------------------------------------------------- Vitals Details Patient Name: [name], [name] Date of Service: 03/02/2016 9:15 AM Medical Record Number: [id number] Patient Account Number: [id number] Date of Birth/Sex: 07-Mar-1956 (59 y.o. Female) Treating RN: Curtis Sites Primary Care Physician: Karie Fetch Other Clinician: Referring Physician: Karie Fetch Treating Physician/Extender: Rudene Re in Treatment: 3 Vital Signs Time Taken: 09:24 Temperature (F): 98.3 Height (in): 60 Pulse (bpm): 55 Weight (lbs): 225 Respiratory Rate (breaths/min): 18 Body Mass Index (BMI): 43.9 Blood Pressure (mmHg): 161/96 Reference Range: 80 - 120 mg / dl Electronic Signature(s) Signed: 03/02/2016 7:01:59 PM By: Curtis Sites Entered By: Curtis Sites on 03/02/2016 09:25:44

## 2016-03-09 ENCOUNTER — Encounter: Payer: BLUE CROSS/BLUE SHIELD | Admitting: Surgery

## 2016-03-09 DIAGNOSIS — I83222 Varicose veins of left lower extremity with both ulcer of calf and inflammation: Secondary | ICD-10-CM | POA: Diagnosis not present

## 2016-03-13 NOTE — Progress Notes (Signed)
[name], [name] ([id number]) Visit Report for 03/09/2016 Arrival Information Details Patient Name: [name], [name] 03/09/2016 10:45 Date of Service: AM Medical Record [id number] Number: Patient Account Number: [id number] 1956-09-23 (60 y.o. Treating RN: Curtis Sites Date of Birth/Sex: Female) Other Clinician: Primary Care Physician: Karie Fetch Treating Britto, Errol Referring Physician: Karie Fetch Physician/Extender: Weeks in Treatment: 4 Visit Information History Since Last Visit Added or deleted any medications: No Patient Arrived: Ambulatory Any new allergies or adverse reactions: No Arrival Time: 10:40 Had [name] fall or experienced change in No Accompanied By: self activities of daily living that may affect Transfer Assistance: None risk of falls: Patient Identification Verified: Yes Signs or symptoms of abuse/neglect since last No Secondary Verification Process Yes visito Completed: Hospitalized since last visit: No Patient Requires Transmission- No Pain Present Now: No Based Precautions: Patient Has Alerts: Yes Patient Alerts: ABI [name] Electronic Signature(s) Signed: 03/12/2016 5:01:01 PM By: Curtis Sites Entered By: Curtis Sites on 03/09/2016 10:40:38 [name], [name] ([id number]) -------------------------------------------------------------------------------- Clinic Level of Care Assessment Details Patient Name: [name], [name] 03/09/2016 10:45 Date of Service: AM Medical Record [id number] Number: Patient Account Number: [id number] 1956-04-29 (60 y.o. Treating RN: Curtis Sites Date of Birth/Sex: Female) Other Clinician: Primary Care Physician: Karie Fetch Treating Britto, Errol Referring Physician: Karie Fetch Physician/Extender: Weeks in Treatment: 4 Clinic Level of Care Assessment Items TOOL 4 Quantity Score  - Use when only an EandM is performed on FOLLOW-UP visit 0 ASSESSMENTS - Nursing Assessment /  Reassessment X - Reassessment of Co-morbidities (includes updates in patient status) 1 10 X - Reassessment of Adherence to Treatment Plan 1 5 ASSESSMENTS - Wound and Skin Assessment / Reassessment X - Simple Wound Assessment / Reassessment - one wound 1 5  - Complex Wound Assessment / Reassessment - multiple wounds 0  - Dermatologic / Skin Assessment (not related to wound area) 0 ASSESSMENTS - Focused Assessment  - Circumferential Edema Measurements - multi extremities 0  - Nutritional Assessment / Counseling / Intervention 0 X - Lower Extremity Assessment (monofilament, tuning fork, pulses) 1 5  - Peripheral Arterial Disease Assessment (using hand held doppler) 0 ASSESSMENTS - Ostomy and/or Continence Assessment and Care  - Incontinence Assessment and Management 0  - Ostomy Care Assessment and Management (repouching, etc.) 0 PROCESS - Coordination of Care X - Simple Patient / Family Education for ongoing care 1 15  - Complex (extensive) Patient / Family Education for ongoing care 0  - Staff obtains Chiropractor, Records, Test Results / Process Orders 0  - Staff telephones HHA, Nursing Homes / Clarify orders / etc 0 Flaming, [name] ([id number])  - Routine Transfer to another Facility (non-emergent condition) 0  - Routine Hospital Admission (non-emergent condition) 0  - New Admissions / Manufacturing engineer / Ordering NPWT, Apligraf, etc. 0  - Emergency Hospital Admission (emergent condition) 0 X - Simple Discharge Coordination 1 10  - Complex (extensive) Discharge Coordination 0 PROCESS - Special Needs  - Pediatric / Minor Patient Management 0  - Isolation Patient Management 0  - Hearing / Language / Visual special needs 0  - Assessment of Community assistance (transportation, D/C planning, etc.) 0  - Additional assistance / Altered mentation 0  - Support Surface(s) Assessment (bed, cushion, seat, etc.) 0 INTERVENTIONS - Wound Cleansing  / Measurement X - Simple Wound Cleansing - one wound 1 5  - Complex Wound Cleansing - multiple wounds 0 X - Wound Imaging (photographs - any number of wounds) 1 5  - Wound  Tracing (instead of photographs) 0 X - Simple Wound Measurement - one wound 1 5  - Complex Wound Measurement - multiple wounds 0 INTERVENTIONS - Wound Dressings  - Small Wound Dressing one or multiple wounds 0  - Medium Wound Dressing one or multiple wounds 0  - Large Wound Dressing one or multiple wounds 0  - Application of Medications - topical 0  - Application of Medications - injection 0 Cliff, [name] ([id number]) INTERVENTIONS - Miscellaneous  - External ear exam 0  - Specimen Collection (cultures, biopsies, blood, body fluids, etc.) 0  - Specimen(s) / Culture(s) sent or taken to Lab for analysis 0  - Patient Transfer (multiple staff / Michiel Sites Lift / Similar devices) 0  - Simple Staple / Suture removal (25 or less) 0  - Complex Staple / Suture removal (26 or more) 0  - Hypo / Hyperglycemic Management (close monitor of Blood Glucose) 0  - Ankle / Brachial Index (ABI) - do not check if billed separately 0 X - Vital Signs 1 5 Has the patient been seen at the hospital within the last three years: Yes Total Score: 70 Level Of Care: New/Established - Level 2 Electronic Signature(s) Signed: 03/09/2016 1:49:30 PM By: Curtis Sites Entered By: Curtis Sites on 03/09/2016 13:49:29 [name], [name] ([id number]) -------------------------------------------------------------------------------- Encounter Discharge Information Details Patient Name: [name], [name] 03/09/2016 10:45 Date of Service: AM Medical Record [id number] Number: Patient Account Number: [id number] 1956/03/25 (59 y.o. Treating RN: Curtis Sites Date of Birth/Sex: Female) Other Clinician: Primary Care Physician: Karie Fetch Treating Evlyn Kanner Referring Physician: Karie Fetch Physician/Extender: Weeks in Treatment: 4 Encounter Discharge Information Items Discharge Pain Level: 0 Discharge Condition: Stable Ambulatory Status: Ambulatory Discharge Destination: Home Private Transportation: Auto Accompanied By: self Schedule Follow-up Appointment: No Medication Reconciliation completed and No provided to Patient/Care Chimene Salo: Clinical Summary of Care: Electronic Signature(s) Signed: 03/09/2016 1:50:09 PM By: Curtis Sites Entered By: Curtis Sites on 03/09/2016 13:50:09 [name], [name] ([id number]) -------------------------------------------------------------------------------- Lower Extremity Assessment Details Patient Name: [name], [name] 03/09/2016 10:45 Date of Service: AM Medical Record [id number] Number: Patient Account Number: [id number] April 01, 1956 (59 y.o. Treating RN: Curtis Sites Date of Birth/Sex: Female) Other Clinician: Primary Care Physician: Karie Fetch Treating Britto, Errol Referring Physician: Karie Fetch Physician/Extender: Weeks in Treatment: 4 Edema Assessment Assessed: [Left: No] [Right: No] Edema: [Left: Ye] [Right: s] Calf Left: Right: Point of Measurement: 33 cm From Medial Instep 52 cm cm Ankle Left: Right: Point of Measurement: 9 cm From Medial Instep 22.5 cm cm Vascular Assessment Pulses: Posterior Tibial Dorsalis Pedis Palpable: [Left:Yes] Extremity colors, hair growth, and conditions: Extremity Color: [Left:Hyperpigmented] Hair Growth on Extremity: [Left:No] Temperature of Extremity: [Left:Warm] Capillary Refill: [Left:< 3 seconds] Electronic Signature(s) Signed: 03/12/2016 5:01:01 PM By: Curtis Sites Entered By: Curtis Sites on 03/09/2016 10:47:19 Zabawa, [name] ([id number]) -------------------------------------------------------------------------------- Multi-Disciplinary Care Plan Details Patient Name: [name], [name] 03/09/2016 10:45 Date of Service: AM Medical  Record [id number] Number: Patient Account Number: [id number] 06-Dec-1956 (59 y.o. Treating RN: Curtis Sites Date of Birth/Sex: Female) Other Clinician: Primary Care Physician: Karie Fetch Treating Evlyn Kanner Referring Physician: Karie Fetch Physician/Extender: Weeks in Treatment: 4 Active Inactive Electronic Signature(s) Signed: 03/09/2016 1:49:03 PM By: Curtis Sites Entered By: Curtis Sites on 03/09/2016 13:49:02 [name], [name] ([id number]) -------------------------------------------------------------------------------- Patient/Caregiver Education Details Patient Name: [name], [name] 03/09/2016 10:45 Date of Service: AM Medical Record [id number] Number: Patient Account Number: [id number] Aug 23, 1956 (59 y.o. Treating RN: Curtis Sites Date of Birth/Gender: Female) Other Clinician: Primary Care Physician:  Letta PateAYCOCK, NGWE Treating Evlyn KannerBritto, Errol Referring Physician: Karie FetchAYCOCK, NGWE Physician/Extender: Tania AdeWeeks in Treatment: 4 Education Assessment Education Provided To: Patient Education Topics Provided Venous: Handouts: Other: wear compression hose every day, off at bedtime Methods: Explain/Verbal Responses: State content correctly Electronic Signature(s) Signed: 03/09/2016 1:50:36 PM By: Curtis Sitesorthy, Joanna Entered By: Curtis Sitesorthy, Joanna on 03/09/2016 13:50:36 [name], [name] AMarland Kitchen. ([id number]) -------------------------------------------------------------------------------- Wound Assessment Details Patient Name: [name], [name] 03/09/2016 10:45 Date of Service: AM Medical Record [id number] Number: Patient Account Number: [id number] 1956-12-06 (59 y.o. Treating RN: Curtis Sitesorthy, Joanna Date of Birth/Sex: Female) Other Clinician: Primary Care Physician: Karie FetchAYCOCK, NGWE Treating Britto, Errol Referring Physician: Karie FetchAYCOCK, NGWE Physician/Extender: Weeks in Treatment: 4 Wound Status Wound Number: 2 Primary Etiology: Venous Leg Ulcer Wound Location: Left, Anterior Lower  Leg Wound Status: Open Wounding Event: Gradually Appeared Date Acquired: 11/28/2015 Weeks Of Treatment: 4 Clustered Wound: No Photos Photo Uploaded By: Curtis Sitesorthy, Joanna on 03/09/2016 16:53:39 Wound Measurements Length: (cm) 0 % Reducti Width: (cm) 0 % Reducti Depth: (cm) 0 Area: (cm) 0 Volume: (cm) 0 on in Area: 100% on in Volume: 100% Wound Description Full Thickness Without Exposed Classification: Support Structures Periwound Skin Texture Texture Color No Abnormalities Noted: No No Abnormalities Noted: No Moisture No Abnormalities Noted: No [name], [name] ([id number]) Electronic Signature(s) Signed: 03/12/2016 5:01:01 PM By: Curtis Sitesorthy, Joanna Entered By: Curtis Sitesorthy, Joanna on 03/09/2016 10:47:28 Abdulla, [name] ([id number]) -------------------------------------------------------------------------------- Vitals Details Patient Name: Schulte, [name] 03/09/2016 10:45 Date of Service: AM Medical Record [id number] Number: Patient Account Number: [id number] 1956-12-06 (59 y.o. Treating RN: Curtis Sitesorthy, Joanna Date of Birth/Sex: Female) Other Clinician: Primary Care Physician: Karie FetchAYCOCK, NGWE Treating Britto, Errol Referring Physician: Karie FetchAYCOCK, NGWE Physician/Extender: Weeks in Treatment: 4 Vital Signs Time Taken: 10:41 Temperature (F): 98.6 Height (in): 60 Pulse (bpm): 58 Weight (lbs): 225 Respiratory Rate (breaths/min): 18 Body Mass Index (BMI): 43.9 Blood Pressure (mmHg): 171/59 Reference Range: 80 - 120 mg / dl Electronic Signature(s) Signed: 03/12/2016 5:01:01 PM By: Curtis Sitesorthy, Joanna Entered By: Curtis Sitesorthy, Joanna on 03/09/2016 10:42:26

## 2016-03-13 NOTE — Progress Notes (Signed)
[name], [name] ([id number]) Visit Report for 03/09/2016 Chief Complaint Document Details Patient Name: [name], [name] 03/09/2016 10:45 Date of Service: AM Medical Record [id number] Number: Patient Account Number: [id number] September 13, 1956 (60 y.o. Treating RN: [name] Date of Birth/Sex: Female) Other Clinician: Primary Care Physician: [name] Treating [name] Referring Physician: Karie FetchAYCOCK, [name] Physician/Extender: [name] Information Obtained from: Patient Chief Complaint Patient presents for treatment of an open ulcer due to venous insufficiency which she's had for several months Electronic Signature(s) Signed: 03/09/2016 11:19:41 AM By: [name] KannerBritto, Jaleiyah Alas MD, FACS Entered By: [name] KannerBritto, Moishy Laday on 03/09/2016 11:19:40 Cokley, [name] ([id number]) -------------------------------------------------------------------------------- HPI Details Patient Name: [name], [name] 03/09/2016 10:45 Date of Service: AM Medical Record [id number] Number: Patient Account Number: [id number] September 13, 1956 (59 y.o. Treating RN: [name] Date of Birth/Sex: Female) Other Clinician: Primary Care Physician: [name] Treating [name] KannerBritto, Lucindia Lemley Referring Physician: Karie FetchAYCOCK, [name] Physician/Extender: [name] History of Present Illness HPI Description: this patient was been previously seen at the wound center is being seen by me for the first time today. 74F with h/o lower extremity swelling presents with a left lower extremity ulcer (first presented to us on 07/12/14) . the patient is deaf and lip reads and is fairly conversant. She has had venous ablation in 2015 but due to lack of insurance and money she has been unable to follow-up with the vascular surgeons not she been wearing compression garments. Does not have a past medical history of diabetes or other issues. He has not been wearing compression stockings and we did try to get her Velcro  wraps but she is unable to afford the copayment. Addendum: I have found some reports from August 2015 when an arterial study was done and the left lower extremity had triphasic waveform showing no arterial insufficiency. She was also to found to have left greater saphenous vein reflux throughout with tortuous veins and on 08/27/2014 she had a laser ablation done. At a follow-up on September 8 it was found that she needed a second procedure as a first procedure was not completely successful. Due to the patient's insurance issues she has not gone back for the second procedure. 03/02/2016 -- he is here with the interpreter today and we have had a thorough discussion about all the above and all questions answered Electronic Signature(s) Signed: 03/09/2016 11:19:46 AM By: [name] KannerBritto, Yamili Lichtenwalner MD, FACS Entered By: [name] KannerBritto, Charla Criscione on 03/09/2016 11:19:46 [name], [name] ([id number]) -------------------------------------------------------------------------------- Physical Exam Details Patient Name: Cheek, [name] 03/09/2016 10:45 Date of Service: AM Medical Record [id number] Number: Patient Account Number: [id number] September 13, 1956 (59 y.o. Treating RN: [name] Date of Birth/Sex: Female) Other Clinician: Primary Care Physician: [name] Treating [name] KannerBritto, Tiyon Sanor Referring Physician: Karie FetchAYCOCK, [name] Physician/Extender: [name] Constitutional . Pulse regular. Respirations normal and unlabored. Afebrile. . Eyes Nonicteric. Reactive to light. Ears, Nose, Mouth, and Throat Lips, teeth, and gums WNL.Marland Kitchen. Moist mucosa without lesions. Neck supple and nontender. No palpable supraclavicular or cervical adenopathy. Normal sized without goiter. Respiratory WNL. No retractions.. Cardiovascular Pedal Pulses WNL. No clubbing, cyanosis or edema. Lymphatic No adneopathy. No adenopathy. No adenopathy. Musculoskeletal Adexa without tenderness or enlargement.. Digits and nails w/o  clubbing, cyanosis, infection, petechiae, ischemia, or inflammatory conditions.. Integumentary (Hair, Skin) No suspicious lesions. No crepitus or fluctuance. No peri-wound warmth or erythema. No masses.Marland Kitchen. Psychiatric Judgement and insight Intact.. No evidence of depression, anxiety, or agitation.. Notes she had an eschar over the ulcer and this was gently removed and there is  no open ulceration. Her lymphedema of stage I persists Electronic Signature(s) Signed: 03/09/2016 11:20:15 AM By: [name] Kanner MD, FACS Entered By: [name] Kanner on 03/09/2016 11:20:14 [name], [name] ([id number]) -------------------------------------------------------------------------------- Physician Orders Details Patient Name: [name], [name] 03/09/2016 10:45 Date of Service: AM Medical Record [id number] Number: Patient Account Number: [id number] 25-Jun-1956 (59 y.o. Treating RN: [name] Sites Date of Birth/Sex: Female) Other Clinician: Primary Care Physician: [name] Fetch Treating [name] Kanner Referring Physician: Karie Fetch Physician/Extender: Tania Ade in Treatment: [name] Verbal / Phone Orders: Yes Clinician: Curtis Sites Read Back and Verified: Yes Diagnosis Coding Discharge From [facility] Services o Discharge from Wound Care Center Electronic Signature(s) Signed: 03/09/2016 3:37:46 PM By: [name] Kanner MD, FACS Signed: 03/12/2016 5:01:01 PM By: [name] Sites Entered By: [name] Sites on 03/09/2016 11:04:03 [name], [name] ([id number]) -------------------------------------------------------------------------------- Problem List Details Patient Name: [name], [name] 03/09/2016 10:45 Date of Service: AM Medical Record [id number] Number: Patient Account Number: [id number] 04-19-1956 (59 y.o. 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 ([id number]) -------------------------------------------------------------------------------- Progress Note Details Patient Name: Soderquist, [name] 03/09/2016 10:45 Date of Service: AM Medical Record [id number] Number: Patient Account Number: [id number] Apr 10, 1956 (59 y.o. Treating RN: [name] Sites Date of Birth/Sex: Female) Other Clinician: Primary Care Physician: [name] Fetch Treating [name] Kanner Referring Physician: Karie Fetch Physician/Extender: [name] Subjective Chief Complaint Information obtained from Patient Patient presents for treatment of an open ulcer due to venous insufficiency which she's had for several months History of Present Illness (HPI) this patient was been previously seen at the wound center is being seen by me for the first time today. 86F with h/o lower extremity swelling presents with a left lower extremity ulcer (first presented to [place] on 07/12/14) . the patient is deaf and lip reads and is fairly conversant. She has had venous ablation in 2015 but due to lack of insurance and money she has been unable to follow-up with the vascular surgeons not she been wearing compression garments. Does not have a past medical history of diabetes or other issues. He has not been wearing compression stockings and we did try to get her Velcro wraps but she is unable to afford the copayment. Addendum: I have found some reports from August 2015 when an  arterial study was done and the left lower extremity had triphasic waveform showing no arterial insufficiency. She was also to found to have left greater saphenous vein reflux throughout with tortuous veins and on 08/27/2014 she had a laser ablation done. At a follow-up on September 8 it was found that she needed a second procedure as a first procedure was not completely successful. Due to the patient's insurance issues she has not gone back for the second procedure. 03/02/2016 -- he is here with the interpreter today and we have had a thorough discussion about all the above and all questions answered Objective Constitutional Pulse regular. Respirations normal and unlabored. Afebrile. Vitals Time Taken: 10:41 AM, Height: 60 in, Weight: 225 lbs, BMI: 43.9, Temperature: 98.6 F, Pulse: 58 Goodwill,  [name] ([id number]) bpm, Respiratory Rate: 18 breaths/min, Blood Pressure: 171/59 mmHg. Eyes Nonicteric. Reactive to light. Ears, Nose, Mouth, and Throat Lips, teeth, and gums WNL.Marland Kitchen Moist mucosa without lesions. Neck supple and nontender. No palpable supraclavicular or cervical adenopathy. Normal sized without goiter. Respiratory WNL. No retractions.. Cardiovascular Pedal Pulses WNL. No clubbing, cyanosis or edema. Lymphatic No adneopathy. No adenopathy. No adenopathy. Musculoskeletal Adexa without tenderness or enlargement.. Digits and nails w/o clubbing, cyanosis, infection, petechiae, ischemia, or inflammatory conditions.Marland Kitchen Psychiatric Judgement and insight Intact.. No evidence of depression, anxiety, or agitation.. General Notes: she had an eschar over the ulcer and this was gently removed and there is no open ulceration. Her lymphedema of stage I persists Integumentary (Hair, Skin) No suspicious lesions. No crepitus or fluctuance. No peri-wound warmth or erythema. No masses.. Wound #2 status is Open. Original cause of wound was Gradually Appeared. The wound is located on  the Left,Anterior Lower Leg. The wound measures 0cm length x 0cm width x 0cm depth; 0cm^2 area and 0cm^3 volume. 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 ([id number]) Her wound is completely healed and there is minimal lymphedema. She has a compression stockings which he has brought along with today and these are open toed and of the 20-30 millimeter Hg variety. I have asked her to continue with elevation and exercise, wear a compression all day and see the vascular surgeon as soon as possible. She is discharged from the wound care services and to be seen back as needed. Plan Discharge From [facility] Services: Discharge from Wound Care Center Her wound is completely healed and there is minimal lymphedema. She has a compression stockings which he has brought along with today and these are open toed and of the 20-30 millimeter Hg variety. I have asked her to continue with elevation and exercise, wear a compression all day and see the vascular surgeon as soon as possible. She is discharged from the wound care services and to be seen back as needed. Electronic Signature(s) Signed: 03/09/2016 11:21:48 AM By: [name] Kanner MD, FACS Entered By: [name] Kanner on 03/09/2016 11:21:48 Herst, [name] AMarland Kitchen ([id number]) -------------------------------------------------------------------------------- SuperBill Details Patient Name: Virtue, [name] Date of Service: 03/09/2016 Medical Record Number: [id number] Patient Account Number: [id number] Date of Birth/Sex: 11-05-1956 (60 y.o. 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 Facility Procedures CPT4 Code: [id number] Description: 57322 - WOUND CARE VISIT-LEV 2 EST PT Modifier: Quantity: 1 Physician Procedures CPT4: Description Modifier Quantity Code [id number] 62376 - WC PHYS LEVEL 2 - EST PT 1 ICD-10 Description Diagnosis 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

## 2016-04-03 ENCOUNTER — Encounter: Payer: Self-pay | Admitting: Emergency Medicine

## 2016-04-03 ENCOUNTER — Emergency Department
Admission: EM | Admit: 2016-04-03 | Discharge: 2016-04-03 | Disposition: A | Discharge status: Home / Self Care | Payer: BLUE CROSS/BLUE SHIELD | Attending: Emergency Medicine | Admitting: Emergency Medicine

## 2016-04-03 ENCOUNTER — Emergency Department: Payer: BLUE CROSS/BLUE SHIELD

## 2016-04-03 DIAGNOSIS — S63501A Unspecified sprain of right wrist, initial encounter: Secondary | ICD-10-CM

## 2016-04-03 DIAGNOSIS — Y999 Unspecified external cause status: Secondary | ICD-10-CM | POA: Insufficient documentation

## 2016-04-03 DIAGNOSIS — Y939 Activity, unspecified: Secondary | ICD-10-CM | POA: Diagnosis not present

## 2016-04-03 DIAGNOSIS — Y92009 Unspecified place in unspecified non-institutional (private) residence as the place of occurrence of the external cause: Secondary | ICD-10-CM | POA: Insufficient documentation

## 2016-04-03 DIAGNOSIS — M25531 Pain in right wrist: Secondary | ICD-10-CM | POA: Diagnosis present

## 2016-04-03 DIAGNOSIS — W19XXXA Unspecified fall, initial encounter: Secondary | ICD-10-CM | POA: Insufficient documentation

## 2016-04-03 IMAGING — DX DG WRIST COMPLETE 3+V*R*
4 series · 4 of 4 positions shown · non-contrast
Comparison: None.

CLINICAL DATA: Status post fall.  Landed on the wrist.

EXAM:
RIGHT WRIST - COMPLETE 3+ VIEW

[wrist ap (1 of 2)]
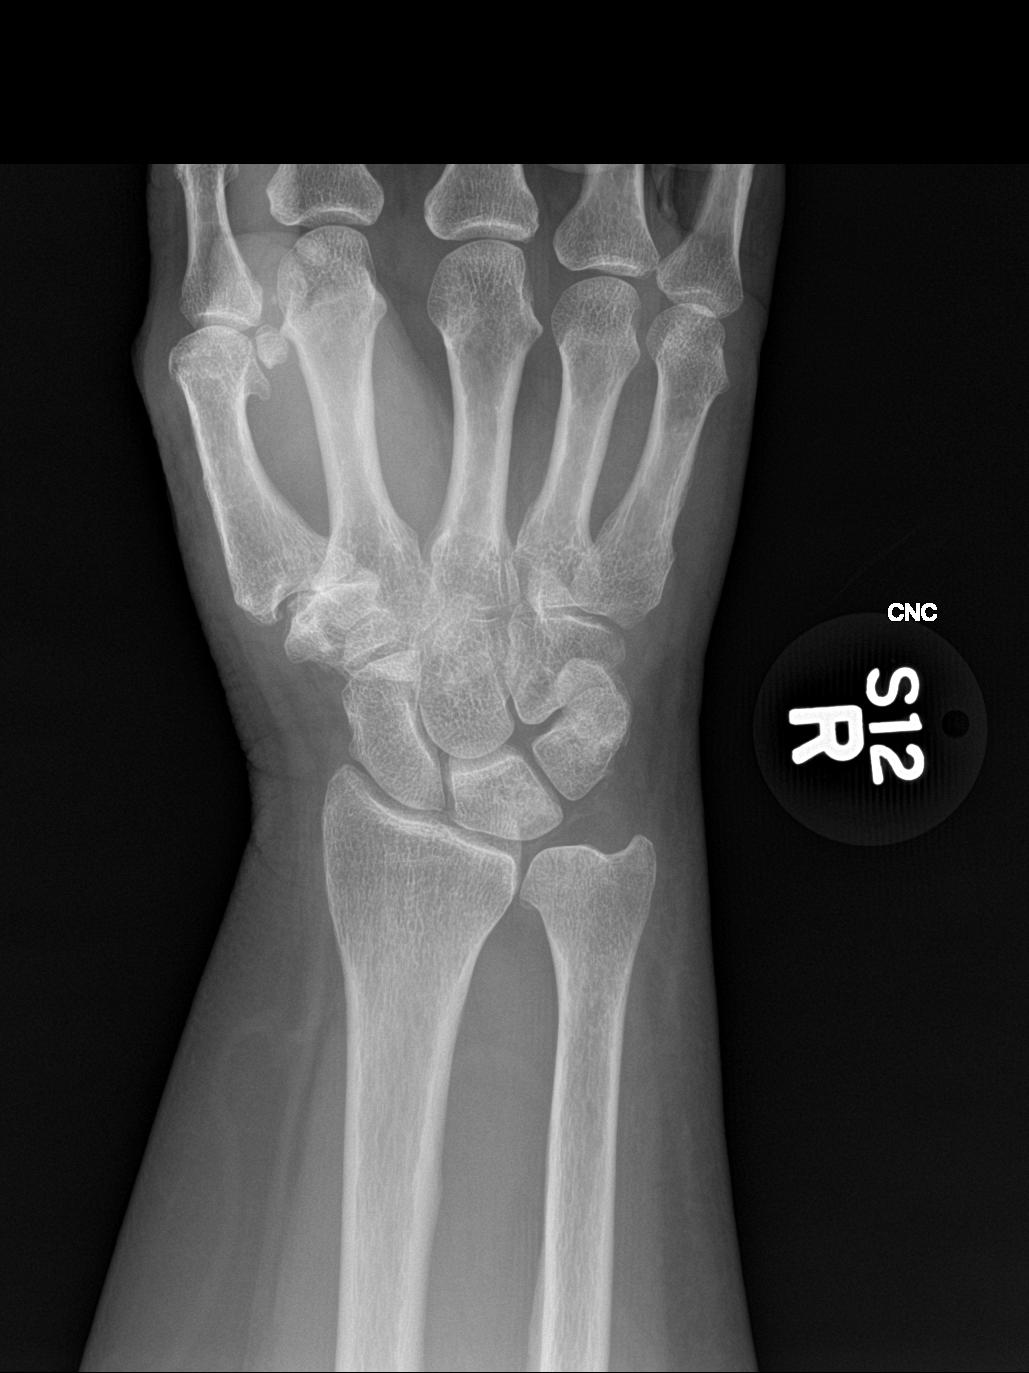

[wrist obl]
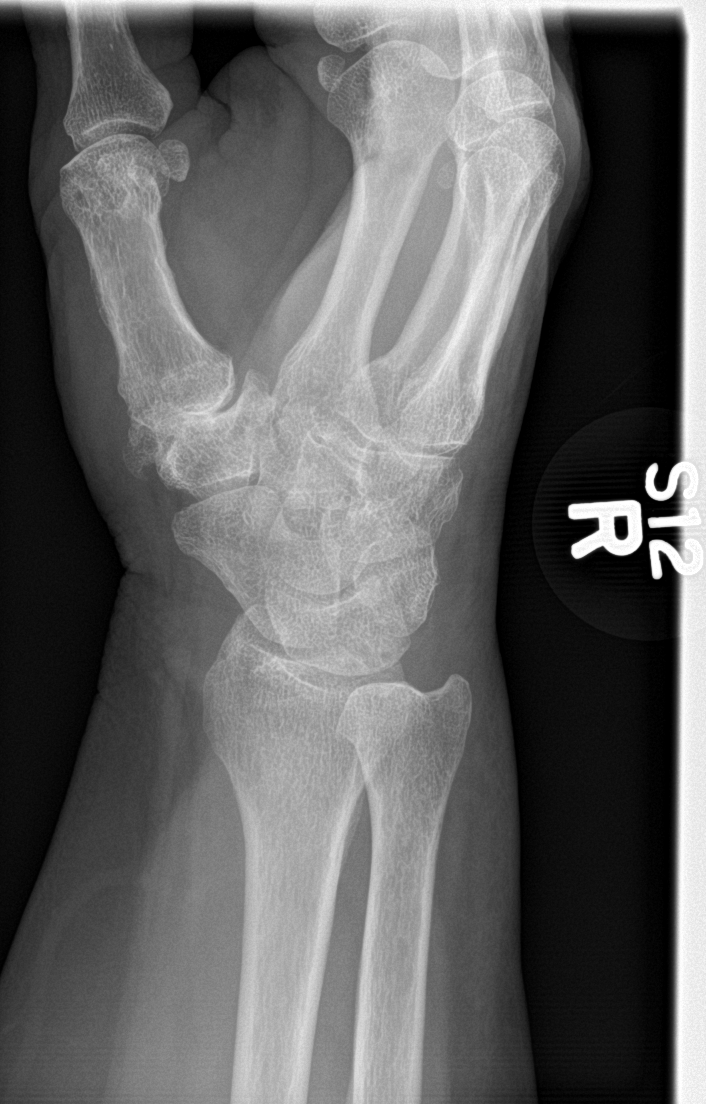

[wrist lat]
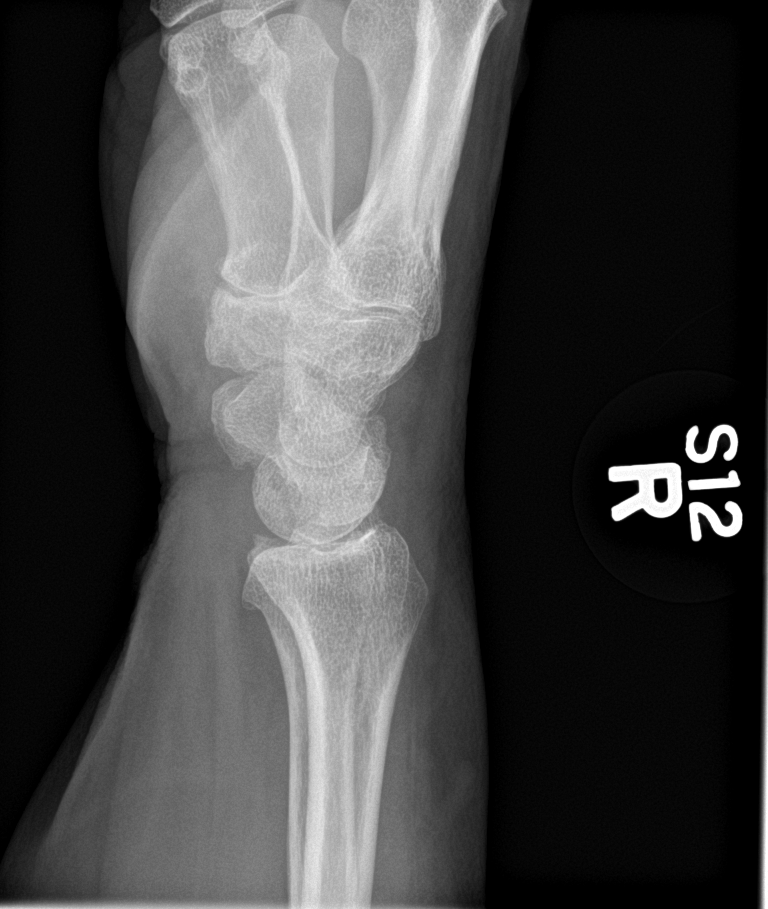

[wrist ap (2 of 2)]
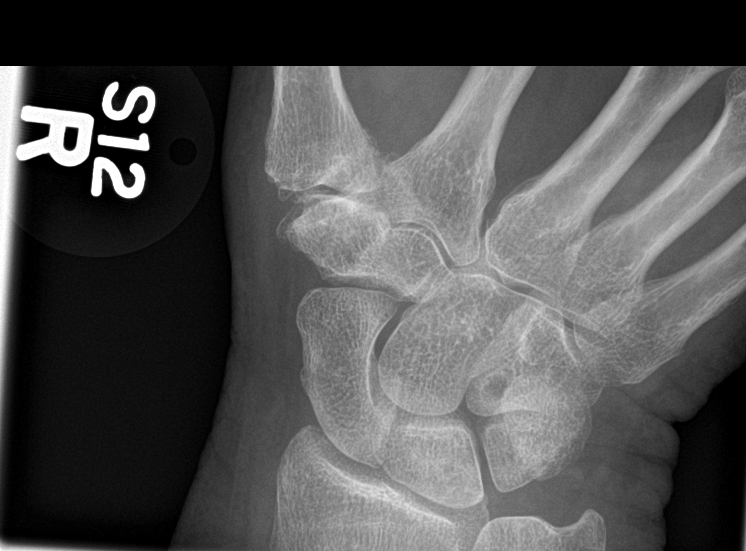

[4 of 4 positions shown; findings below may reference images not displayed]

FINDINGS: There is no evidence of fracture or dislocation. There is severe
osteoarthritis of the first CMC joint. There is mild osteoarthritis
of the first MCP joint. Soft tissues are unremarkable.
IMPRESSION: No acute osseous injury of the right wrist.

## 2016-04-03 MED ORDER — NAPROXEN 500 MG PO TABS: 500.0000 mg | ORAL_TABLET | Freq: Two times a day (BID) | ORAL | Status: DC

## 2016-04-03 MED ORDER — TRAMADOL HCL 50 MG PO TABS: 50.0000 mg | ORAL_TABLET | Freq: Four times a day (QID) | ORAL | Status: DC | PRN

## 2016-04-03 NOTE — ED Notes (Addendum)
Pt to ed with c/o right wrist pain that started this am after she fell at home.

## 2016-04-03 NOTE — ED Notes (Signed)
Larey SeatFell this am   Landed on right wrist area  Having pain and min swelling  Positive pulses and good sensation

## 2016-04-03 NOTE — ED Provider Notes (Signed)
[facility] Emergency Department Provider Note  ____________________________________________  Time seen: Approximately 10:54 AM  I have reviewed the triage vital signs and the nursing notes.   HISTORY  Chief Complaint Wrist Pain    HPI [name] is a 60 y.o. female patient complaining of right wrist pain secondary to a fall which occurred this morning. Patient states she fell at home while leaving for work. Patient state has a fall she continued to work note she had discomfort with the right wrist. Patient states she cannot take the top off about an aspirin in order to relieve the pain. Patient is rating her pain as a 4/10. Patient states pain increase with extension and flexion of the right wrist. Patient is right-hand dominant.   History reviewed. No pertinent past medical history.  Patient Active Problem List   Diagnosis Date Noted  . Venous ulcer of left leg (HCC) 02/09/2016    Past Surgical History  Procedure Laterality Date  . Cholecystectomy      Current Outpatient Rx  Name  Route  Sig  Dispense  Refill  . acetaminophen (TYLENOL) 325 MG tablet   Oral   Take 650 mg by mouth every 6 (six) hours as needed.         . naproxen (NAPROSYN) 500 MG tablet   Oral   Take 1 tablet (500 mg total) by mouth 2 (two) times daily with a meal.   20 tablet   0   . sulfamethoxazole-trimethoprim (BACTRIM DS,SEPTRA DS) 800-160 MG tablet   Oral   Take 1 tablet by mouth 2 (two) times daily.   28 tablet   0   . traMADol (ULTRAM) 50 MG tablet   Oral   Take 1 tablet (50 mg total) by mouth every 6 (six) hours as needed.   20 tablet   0   . traMADol (ULTRAM) 50 MG tablet   Oral   Take 1 tablet (50 mg total) by mouth every 6 (six) hours as needed for moderate pain.   12 tablet   0     Allergies Codeine; Septra ds; and Tramadol  History reviewed. No pertinent family history.  Social History Social History  Substance Use Topics  .  Smoking status: Never Smoker   . Smokeless tobacco: None  . Alcohol Use: No    Review of Systems Constitutional: No fever/chills Eyes: No visual changes. ENT: No sore throat. Cardiovascular: Denies chest pain. Respiratory: Denies shortness of breath. Gastrointestinal: No abdominal pain.  No nausea, no vomiting.  No diarrhea.  No constipation. Genitourinary: Negative for dysuria. Musculoskeletal: Right wrist pain Skin: Negative for rash. Neurological: Negative for headaches, focal weakness or numbness.    ____________________________________________   PHYSICAL EXAM:  VITAL SIGNS: ED Triage Vitals  Enc Vitals Group     BP --      Pulse --      Resp --      Temp --      Temp src --      SpO2 --      Weight --      Height --      Head Cir --      Peak Flow --      Pain Score 04/03/16 1048 4     Pain Loc --      Pain Edu? --      Excl. in GC? --     Constitutional: Alert and oriented. Well appearing and in no acute distress. Eyes:  Conjunctivae are normal. PERRL. EOMI. Head: Atraumatic. Nose: No congestion/rhinnorhea. Mouth/Throat: Mucous membranes are moist.  Oropharynx non-erythematous. Neck: No stridor.  No cervical spine tenderness to palpation. Hematological/Lymphatic/Immunilogical: No cervical lymphadenopathy. Cardiovascular: Normal rate, regular rhythm. Grossly normal heart sounds.  Good peripheral circulation. Respiratory: Normal respiratory effort.  No retractions. Lungs CTAB. Gastrointestinal: Soft and nontender. No distention. No abdominal bruits. No CVA tenderness. Musculoskeletal:No obvious deformities to the right wrist. There is no abrasion or ecchymosis. Patient neurovascularly intact. Patient decreased grip strength in comparison to the left nondominant hand. Neurologic:  Normal speech and language. No gross focal neurologic deficits are appreciated. No gait instability. Skin:  Skin is warm, dry and intact. No rash noted. Psychiatric: Mood and affect  are normal. Speech and behavior are normal.  ____________________________________________   LABS (all labs ordered are listed, but only abnormal results are displayed)  Labs Reviewed - No data to display ____________________________________________  EKG   ____________________________________________  RADIOLOGY  No acute findings on x-ray of the right wrist. ____________________________________________   PROCEDURES  Procedure(s) performed: None  Critical Care performed: No  ____________________________________________   INITIAL IMPRESSION / ASSESSMENT AND PLAN / ED COURSE  Pertinent labs & imaging results that were available during my care of the patient were reviewed by me and considered in my medical decision making (see chart for details).  Sprain right wrist. Discussed x-ray finding with patient. Patient given discharge care instructions. Patient placed in a wrist splint advised to wear for 2-3 days as needed. Patient given a prescription for naproxen and tramadol. Patient advised to follow-up with family doctor ____________________________________________   FINAL CLINICAL IMPRESSION(S) / ED DIAGNOSES  Final diagnoses:  Sprain of right wrist, initial encounter      Joni ReiningRonald K Eder Macek, PA-C 04/03/16 1219  Arnaldo NatalPaul F Malinda, MD 04/03/16 [phone]

## 2016-04-03 NOTE — Discharge Instructions (Signed)
Wear wrist support 2-3 days as needed.

## 2016-07-09 ENCOUNTER — Emergency Department
Admission: EM | Admit: 2016-07-09 | Discharge: 2016-07-09 | Disposition: A | Discharge status: Home / Self Care | Payer: BLUE CROSS/BLUE SHIELD | Attending: Emergency Medicine | Admitting: Emergency Medicine

## 2016-07-09 DIAGNOSIS — R110 Nausea: Secondary | ICD-10-CM

## 2016-07-09 DIAGNOSIS — N3090 Cystitis, unspecified without hematuria: Secondary | ICD-10-CM | POA: Diagnosis not present

## 2016-07-09 DIAGNOSIS — Z791 Long term (current) use of non-steroidal anti-inflammatories (NSAID): Secondary | ICD-10-CM | POA: Insufficient documentation

## 2016-07-09 LAB — COMPREHENSIVE METABOLIC PANEL
ALBUMIN: 4 g/dL (ref 3.5–5.0)
ALT: 27 U/L (ref 14–54)
ANION GAP: 8 (ref 5–15)
AST: 33 U/L (ref 15–41)
Alkaline Phosphatase: 109 U/L (ref 38–126)
BILIRUBIN TOTAL: 0.6 mg/dL (ref 0.3–1.2)
BUN: 12 mg/dL (ref 6–20)
CALCIUM: 9.4 mg/dL (ref 8.9–10.3)
CO2: 28 mmol/L (ref 22–32)
Chloride: 104 mmol/L (ref 101–111)
Creatinine, Ser: 0.73 mg/dL (ref 0.44–1.00)
Glucose, Bld: 89 mg/dL (ref 65–99)
POTASSIUM: 3.4 mmol/L — AB (ref 3.5–5.1)
Sodium: 140 mmol/L (ref 135–145)
TOTAL PROTEIN: 7.5 g/dL (ref 6.5–8.1)

## 2016-07-09 LAB — URINALYSIS COMPLETE WITH MICROSCOPIC (ARMC ONLY)
BILIRUBIN URINE: NEGATIVE
Glucose, UA: NEGATIVE mg/dL
KETONES UR: NEGATIVE mg/dL
NITRITE: NEGATIVE
PH: 5 (ref 5.0–8.0)
Protein, ur: NEGATIVE mg/dL
Specific Gravity, Urine: 1.016 (ref 1.005–1.030)

## 2016-07-09 LAB — CBC WITH DIFFERENTIAL/PLATELET
BASOS PCT: 1 %
Basophils Absolute: 0 10*3/uL (ref 0–0.1)
Eosinophils Absolute: 0.1 10*3/uL (ref 0–0.7)
Eosinophils Relative: 1 %
HEMATOCRIT: 44.7 % (ref 35.0–47.0)
Hemoglobin: 15.5 g/dL (ref 12.0–16.0)
LYMPHS ABS: 1.9 10*3/uL (ref 1.0–3.6)
Lymphocytes Relative: 27 %
MCH: 31.6 pg (ref 26.0–34.0)
MCHC: 34.6 g/dL (ref 32.0–36.0)
MCV: 91.2 fL (ref 80.0–100.0)
MONO ABS: 0.5 10*3/uL (ref 0.2–0.9)
MONOS PCT: 8 %
NEUTROS ABS: 4.5 10*3/uL (ref 1.4–6.5)
Neutrophils Relative %: 63 %
Platelets: 241 10*3/uL (ref 150–440)
RBC: 4.9 MIL/uL (ref 3.80–5.20)
RDW: 13.6 % (ref 11.5–14.5)
WBC: 7.1 10*3/uL (ref 3.6–11.0)

## 2016-07-09 LAB — LIPASE, BLOOD: LIPASE: 19 U/L (ref 11–51)

## 2016-07-09 MED ORDER — ONDANSETRON HCL 4 MG PO TABS: 4.0000 mg | ORAL_TABLET | Freq: Four times a day (QID) | ORAL | Status: DC | PRN

## 2016-07-09 MED ORDER — NITROFURANTOIN MACROCRYSTAL 100 MG PO CAPS: 100.0000 mg | ORAL_CAPSULE | Freq: Two times a day (BID) | ORAL | Status: DC

## 2016-07-09 MED ORDER — ONDANSETRON 8 MG PO TBDP
8.0000 mg | ORAL_TABLET | Freq: Once | ORAL | Status: AC
  Administered 2016-07-09: 8 mg via ORAL
  Filled 2016-07-09: qty 1

## 2016-07-09 NOTE — Discharge Instructions (Signed)
Nausea, Adult °Nausea is the feeling that you have an upset stomach or have to vomit. Nausea by itself is not likely a serious concern, but it may be an early sign of more serious medical problems. As nausea gets worse, it can lead to vomiting. If vomiting develops, there is the risk of dehydration.  °CAUSES  °· Viral infections. °· Food poisoning. °· Medicines. °· Pregnancy. °· Motion sickness. °· Migraine headaches. °· Emotional distress. °· Severe pain from any source. °· Alcohol intoxication. °HOME CARE INSTRUCTIONS °· Get plenty of rest. °· Ask your caregiver about specific rehydration instructions. °· Eat small amounts of food and sip liquids more often. °· Take all medicines as told by your caregiver. °SEEK MEDICAL CARE IF: °· You have not improved after 2 days, or you get worse. °· You have a headache. °SEEK IMMEDIATE MEDICAL CARE IF:  °· You have a fever. °· You faint. °· You keep vomiting or have blood in your vomit. °· You are extremely weak or dehydrated. °· You have dark or bloody stools. °· You have severe chest or abdominal pain. °MAKE SURE YOU: °· Understand these instructions. °· Will watch your condition. °· Will get help right away if you are not doing well or get worse. °  °This information is not intended to replace advice given to you by your health care provider. Make sure you discuss any questions you have with your health care provider. °  °Document Released: 01/17/2005 Document Revised: 12/31/2014 Document Reviewed: 08/22/2011 °Elsevier Interactive Patient Education ©2016 [employer] ° °Urinary Tract Infection °Urinary tract infections (UTIs) can develop anywhere along your urinary tract. Your urinary tract is your body's drainage system for removing wastes and extra water. Your urinary tract includes two kidneys, two ureters, a bladder, and a urethra. Your kidneys are a pair of bean-shaped organs. Each kidney is about the size of your fist. They are located below your ribs, one on each  side of your spine. °CAUSES °Infections are caused by microbes, which are microscopic organisms, including fungi, viruses, and bacteria. These organisms are so small that they can only be seen through a microscope. Bacteria are the microbes that most commonly cause UTIs. °SYMPTOMS  °Symptoms of UTIs may vary by age and gender of the patient and by the location of the infection. Symptoms in young women typically include a frequent and intense urge to urinate and a painful, burning feeling in the bladder or urethra during urination. Older women and men are more likely to be tired, shaky, and weak and have muscle aches and abdominal pain. A fever may mean the infection is in your kidneys. Other symptoms of a kidney infection include pain in your back or sides below the ribs, nausea, and vomiting. °DIAGNOSIS °To diagnose a UTI, your caregiver will ask you about your symptoms. Your caregiver will also ask you to provide a urine sample. The urine sample will be tested for bacteria and white blood cells. White blood cells are made by your body to help fight infection. °TREATMENT  °Typically, UTIs can be treated with medication. Because most UTIs are caused by a bacterial infection, they usually can be treated with the use of antibiotics. The choice of antibiotic and length of treatment depend on your symptoms and the type of bacteria causing your infection. °HOME CARE INSTRUCTIONS °· If you were prescribed antibiotics, take them exactly as your caregiver instructs you. Finish the medication even if you feel better after you have only taken some of the   medication. °· Drink enough water and fluids to keep your urine clear or pale yellow. °· Avoid caffeine, tea, and carbonated beverages. They tend to irritate your bladder. °· Empty your bladder often. Avoid holding urine for long periods of time. °· Empty your bladder before and after sexual intercourse. °· After a bowel movement, women should cleanse from front to back. Use  each tissue only once. °SEEK MEDICAL CARE IF:  °· You have back pain. °· You develop a fever. °· Your symptoms do not begin to resolve within 3 days. °SEEK IMMEDIATE MEDICAL CARE IF:  °· You have severe back pain or lower abdominal pain. °· You develop chills. °· You have nausea or vomiting. °· You have continued burning or discomfort with urination. °MAKE SURE YOU:  °· Understand these instructions. °· Will watch your condition. °· Will get help right away if you are not doing well or get worse. °  °This information is not intended to replace advice given to you by your health care provider. Make sure you discuss any questions you have with your health care provider. °  °Document Released: 09/19/2005 Document Revised: 08/31/2015 Document Reviewed: 01/18/2012 °Elsevier Interactive Patient Education ©2016 [employer] Inc. ° °

## 2016-07-09 NOTE — ED Notes (Signed)
Pt given ginger ale.

## 2016-07-09 NOTE — ED Notes (Signed)
Pt c/o nausea X 2 days. States that she does not have appetite. Concerned that she was bitten by a bug but she does not have a bug bite. Pt left leg purple/blue and discolored, pt following up with her doctor about that. States that they are "going to shut it down" when referring to leg. Generalized fatigue. Pt alert and oriented X4, active, cooperative, pt in NAD. RR even and unlabored, color WNL.

## 2016-07-09 NOTE — ED Notes (Signed)
Patient states she is unable to eat and that all she wants to do is sleep.  States she saw her PMD regarding the pain in her leg, but that this is different.  States she wants to eat but then becomes to nauseated to eat.

## 2016-07-09 NOTE — ED Provider Notes (Signed)
[facility] Emergency Department Provider Note  ____________________________________________  Time seen: 7:30 AM  I have reviewed the triage vital signs and the nursing notes.   HISTORY  Chief Complaint Nausea    HPI [name] is a 60 y.o. female who complains of nausea for the past 2 days. She has a decreased appetite as well. No pain anywhere. No vomiting diarrhea or fever. She does report that she has been having some urinary urgency and frequency recently with small amounts of urine. Still eating and drinking.No other acute symptoms. No chest pain shortness of breath or abdominal pain. No fevers or chills. Has chronic left leg pain which she took pain medicine for 2 days ago but not since has it has stopped hurting.     No past medical history on file. Varicose veins Venous stasis disease  Patient Active Problem List   Diagnosis Date Noted  . Venous ulcer of left leg (HCC) 02/09/2016     Past Surgical History  Procedure Laterality Date  . Cholecystectomy       Current Outpatient Rx  Name  Route  Sig  Dispense  Refill  . acetaminophen (TYLENOL) 325 MG tablet   Oral   Take 650 mg by mouth every 6 (six) hours as needed.         . naproxen (NAPROSYN) 500 MG tablet   Oral   Take 1 tablet (500 mg total) by mouth 2 (two) times daily with a meal.   20 tablet   0   . naproxen (NAPROSYN) 500 MG tablet   Oral   Take 1 tablet (500 mg total) by mouth 2 (two) times daily with a meal.   20 tablet   0   . nitrofurantoin (MACRODANTIN) 100 MG capsule   Oral   Take 1 capsule (100 mg total) by mouth 2 (two) times daily.   6 capsule   0   . ondansetron (ZOFRAN) 4 MG tablet   Oral   Take 1 tablet (4 mg total) by mouth every 6 (six) hours as needed for nausea or vomiting.   20 tablet   1   . sulfamethoxazole-trimethoprim (BACTRIM DS,SEPTRA DS) 800-160 MG tablet   Oral   Take 1 tablet by mouth 2 (two) times daily.   28 tablet    0   . traMADol (ULTRAM) 50 MG tablet   Oral   Take 1 tablet (50 mg total) by mouth every 6 (six) hours as needed for moderate pain.   12 tablet   0      Allergies Codeine; Septra ds; and Tramadol   No family history on file.  Social History Social History  Substance Use Topics  . Smoking status: Never Smoker   . Smokeless tobacco: Not on file  . Alcohol Use: No    Review of Systems  Constitutional:   No fever or chills.  ENT:   No sore throat. No rhinorrhea. Cardiovascular:   No chest pain. Respiratory:   No dyspnea or cough. Gastrointestinal:   Negative for abdominal pain, vomiting and diarrhea.  Genitourinary:   Negative for dysuria or difficulty urinating. Musculoskeletal:   Chronic leg swelling and discoloration, no changes Neurological:   Negative for headaches 10-point ROS otherwise negative.  ____________________________________________   PHYSICAL EXAM:  VITAL SIGNS: ED Triage Vitals  Enc Vitals Group     BP 07/09/16 0523 159/101 mmHg     Pulse Rate 07/09/16 0523 59     Resp 07/09/16 0523 20  Temp 07/09/16 0523 98 F (36.7 C)     Temp Source 07/09/16 0523 Oral     SpO2 07/09/16 0523 100 %     Weight 07/09/16 0523 231 lb (104.781 kg)     Height 07/09/16 0523 5' (1.524 m)     Head Cir --      Peak Flow --      Pain Score 07/09/16 0649 4     Pain Loc --      Pain Edu? --      Excl. in GC? --     Vital signs reviewed, nursing assessments reviewed.   Constitutional:   Alert and oriented. Well appearing and in no distress. Eyes:   No scleral icterus. No conjunctival pallor. PERRL. EOMI.  No nystagmus. ENT   Head:   Normocephalic and atraumatic.   Nose:   No congestion/rhinnorhea. No septal hematoma   Mouth/Throat:   MMM, no pharyngeal erythema. No peritonsillar mass.    Neck:   No stridor. No SubQ emphysema. No meningismus. Hematological/Lymphatic/Immunilogical:   No cervical lymphadenopathy. Cardiovascular:   RRR. Symmetric  bilateral radial and DP pulses.  No murmurs.  Respiratory:   Normal respiratory effort without tachypnea nor retractions. Breath sounds are clear and equal bilaterally. No wheezes/rales/rhonchi. Gastrointestinal:   Soft and nontender. Non distended. There is no CVA tenderness.  No rebound, rigidity, or guarding. Genitourinary:   deferred Musculoskeletal:   Symmetric nonpitting edema bilateral lower extremities, nontender no changes noted from inflammatory changes. There is chronic purplish discoloration of the left shin, unchanged. No open wound. No drainage. Neurologic:   Normal speech and language.  CN 2-10 normal. Motor grossly intact. No gross focal neurologic deficits are appreciated.  Skin:    Skin is warm, dry and intact. No rash noted.  No petechiae, purpura, or bullae.  ____________________________________________    LABS (pertinent positives/negatives) (all labs ordered are listed, but only abnormal results are displayed) Labs Reviewed  URINALYSIS COMPLETEWITH MICROSCOPIC ([facility] ONLY) - Abnormal; Notable for the following:    Color, Urine YELLOW (*)    APPearance CLOUDY (*)    Hgb urine dipstick 2+ (*)    Leukocytes, UA 3+ (*)    Bacteria, UA RARE (*)    Squamous Epithelial / LPF 6-30 (*)    All other components within normal limits  COMPREHENSIVE METABOLIC PANEL - Abnormal; Notable for the following:    Potassium 3.4 (*)    All other components within normal limits  CBC WITH DIFFERENTIAL/PLATELET  LIPASE, BLOOD   ____________________________________________   EKG    ____________________________________________    RADIOLOGY    ____________________________________________   PROCEDURES   ____________________________________________   INITIAL IMPRESSION / ASSESSMENT AND PLAN / ED COURSE  Pertinent labs & imaging results that were available during my care of the patient were reviewed by me and considered in my medical decision making (see chart for  details).  Patient well appearing no acute distress. Vital signs unremarkable. Pain acute complaint is malaise and urinary urgency and frequency. Labs unremarkable except for evidence of urinary tract infection. I'll treat with nausea medicine Zofran and Macrobid. Follow up with primary care. No evidence of soft tissue infection abscess cellulitis necrotizing infection or osteoarthritis. No evidence of sepsis.     ____________________________________________   FINAL CLINICAL IMPRESSION(S) / ED DIAGNOSES  Final diagnoses:  Cystitis  Nausea       Portions of this note were generated with dragon dictation software. Dictation errors may occur despite best attempts at proofreading.  Sharman Cheek, MD 07/09/16 [id number]

## 2016-12-10 ENCOUNTER — Emergency Department
Admission: EM | Admit: 2016-12-10 | Discharge: 2016-12-10 | Disposition: A | Discharge status: Home / Self Care | Payer: BLUE CROSS/BLUE SHIELD | Attending: Emergency Medicine | Admitting: Emergency Medicine

## 2016-12-10 ENCOUNTER — Encounter: Payer: Self-pay | Admitting: Emergency Medicine

## 2016-12-10 DIAGNOSIS — M545 Low back pain, unspecified: Secondary | ICD-10-CM

## 2016-12-10 DIAGNOSIS — M549 Dorsalgia, unspecified: Secondary | ICD-10-CM | POA: Diagnosis present

## 2016-12-10 DIAGNOSIS — R110 Nausea: Secondary | ICD-10-CM | POA: Insufficient documentation

## 2016-12-10 LAB — URINALYSIS, COMPLETE (UACMP) WITH MICROSCOPIC
Bilirubin Urine: NEGATIVE
Glucose, UA: NEGATIVE mg/dL
Ketones, ur: NEGATIVE mg/dL
Nitrite: NEGATIVE
PROTEIN: NEGATIVE mg/dL
Specific Gravity, Urine: 1.019 (ref 1.005–1.030)
pH: 5 (ref 5.0–8.0)

## 2016-12-10 MED ORDER — ONDANSETRON HCL 4 MG PO TABS: 4.0000 mg | ORAL_TABLET | Freq: Every day | ORAL | 1 refills | Status: DC | PRN

## 2016-12-10 MED ORDER — TRAMADOL HCL 50 MG PO TABS
50.0000 mg | ORAL_TABLET | Freq: Four times a day (QID) | ORAL | 0 refills | Status: AC | PRN
Start: 2016-12-10 — End: 2017-12-10

## 2016-12-10 NOTE — ED Notes (Signed)
See triage note  Having lower back with nausea since yesterday  Was recently seen by her PCP and given a 3 day supply of antibiotic and nausea meds  States sx's returned yesterday

## 2016-12-10 NOTE — ED Provider Notes (Signed)
[facility] Emergency Department Provider Note   ____________________________________________    I have reviewed the triage vital signs and the nursing notes.   HISTORY  Chief Complaint Back Pain     HPI [name] is a 60 y.o. female who presents with back pain. Patient reports over the last several months she has had intermittent bilateral cramping sensation in her lower back that is mild. She reports her physician is working this up but she was unable to get an appointment today so decided to come to the ED to see if anything more could be done. She denies lower extremity weakness or difficulty urinating. She denies dysuria or frequency. No fevers or chills. She denies abdominal pain. No vomiting. She states one time her physician gave her antibiotics and seemed to help.   History reviewed. No pertinent past medical history.  Patient Active Problem List   Diagnosis Date Noted  . Venous ulcer of left leg (HCC) 02/09/2016    Past Surgical History:  Procedure Laterality Date  . CHOLECYSTECTOMY      Prior to Admission medications   Medication Sig Start Date End Date Taking? Authorizing Provider  acetaminophen (TYLENOL) 325 MG tablet Take 650 mg by mouth every 6 (six) hours as needed.    Historical Provider, MD  nitrofurantoin (MACRODANTIN) 100 MG capsule Take 1 capsule (100 mg total) by mouth 2 (two) times daily. 07/09/16   Sharman CheekPhillip Stafford, MD  ondansetron (ZOFRAN) 4 MG tablet Take 1 tablet (4 mg total) by mouth daily as needed for nausea or vomiting. 12/10/16   Jene Everyobert Saraann Enneking, MD  traMADol (ULTRAM) 50 MG tablet Take 1 tablet (50 mg total) by mouth every 6 (six) hours as needed. 12/10/16 12/10/17  Jene Everyobert Emani Morad, MD     Allergies Codeine; Septra ds [sulfamethoxazole-trimethoprim]; and Tramadol  No family history on file.  Social History Social History  Substance Use Topics  . Smoking status: Never Smoker  . Smokeless tobacco: Not on  file  . Alcohol use No    Review of Systems  Constitutional: No fever/chills     Gastrointestinal: No abdominal pain.   no vomiting.  Normal stools Genitourinary: Negative for dysuria. No frequency Musculoskeletal: As above Skin: Negative for rash. Neurological: Negative for headaches no lower extremity weakness    ____________________________________________   PHYSICAL EXAM:  VITAL SIGNS: ED Triage Vitals [12/10/16 0845]  Enc Vitals Group     BP (!) 150/90     Pulse Rate 74     Resp 20     Temp 98.6 F (37 C)     Temp Source Oral     SpO2 98 %     Weight 230 lb (104.3 kg)     Height 5' (1.524 m)     Head Circumference      Peak Flow      Pain Score 5     Pain Loc      Pain Edu?      Excl. in GC?     Constitutional: Alert and oriented. No acute distress. Pleasant and interactive Eyes: Conjunctivae are normal.   Nose: No congestion/rhinnorhea. Mouth/Throat: Mucous membranes are moist.   Cardiovascular: Normal rate, regular rhythm.  Respiratory: Normal respiratory effort.  No retractions. Abdomen: No tenderness to palpation, no distention Genitourinary: deferred Musculoskeletal: Strength in the lower extremities. No vertebral tenderness, no tenderness to palpation of the paraspinal regions of the back. Full range of motion without discomfort.   Neurologic:  Normal speech  and language. No gross focal neurologic deficits are appreciated.   Skin:  Skin is warm, dry and intact. No rash noted.   ____________________________________________   LABS (all labs ordered are listed, but only abnormal results are displayed)  Labs Reviewed  URINALYSIS, COMPLETE (UACMP) WITH MICROSCOPIC - Abnormal; Notable for the following:       Result Value   Color, Urine YELLOW (*)    APPearance CLEAR (*)    Hgb urine dipstick MODERATE (*)    Leukocytes, UA SMALL (*)    Bacteria, UA RARE (*)    Squamous Epithelial / LPF 0-5 (*)    All other components within normal limits    URINE CULTURE   ____________________________________________  EKG   ____________________________________________  RADIOLOGY  None ____________________________________________   PROCEDURES  Procedure(s) performed: No    Critical Care performed: No ____________________________________________   INITIAL IMPRESSION / ASSESSMENT AND PLAN / ED COURSE  Pertinent labs & imaging results that were available during my care of the patient were reviewed by me and considered in my medical decision making (see chart for details).  Patient extremely well appearing with no distress. Her vital signs are unremarkable. She appears quite comfortable. Her urinalysis demonstrates small leukocytes but she has no symptoms of urinary tract infection, we will send urine culture. This time I will not treat. Recommend supportive care at this time and given that outpatient workup is in process recommend outpatient follow-up as well. Return cautioned discussed at length with patient. She understands and agrees with the plan.   ____________________________________________   FINAL CLINICAL IMPRESSION(S) / ED DIAGNOSES  Final diagnoses:  Acute right-sided low back pain without sciatica      NEW MEDICATIONS STARTED DURING THIS VISIT:  Discharge Medication List as of 12/10/2016 11:09 AM       Note:  This document was prepared using Dragon voice recognition software and may include unintentional dictation errors.    Jene Everyobert Sloan Galentine, MD 12/10/16 1324

## 2016-12-10 NOTE — ED Triage Notes (Signed)
Low back pain since yesterday. Denies fall or injury at onset. States history of same.

## 2016-12-11 LAB — URINE CULTURE

## 2017-01-08 ENCOUNTER — Encounter: Payer: Self-pay | Admitting: Emergency Medicine

## 2017-01-08 ENCOUNTER — Emergency Department
Admission: EM | Admit: 2017-01-08 | Discharge: 2017-01-08 | Disposition: A | Discharge status: Home / Self Care | Payer: BLUE CROSS/BLUE SHIELD | Attending: Emergency Medicine | Admitting: Emergency Medicine

## 2017-01-08 DIAGNOSIS — Z79899 Other long term (current) drug therapy: Secondary | ICD-10-CM | POA: Diagnosis not present

## 2017-01-08 DIAGNOSIS — R040 Epistaxis: Secondary | ICD-10-CM | POA: Diagnosis not present

## 2017-01-08 MED ORDER — OXYMETAZOLINE HCL 0.05 % NA SOLN
1.0000 | Freq: Once | NASAL | Status: AC
  Administered 2017-01-08: 1 via NASAL

## 2017-01-08 MED ORDER — LIDOCAINE HCL 4 % EX SOLN
Freq: Once | CUTANEOUS | Status: AC
  Administered 2017-01-08: 13:00:00 via TOPICAL

## 2017-01-08 NOTE — ED Provider Notes (Signed)
[facility] Emergency Department Provider Note   ____________________________________________   None    (approximate)  I have reviewed the triage vital signs and the nursing notes.   HISTORY  Chief Complaint Epistaxis    HPI [name] is a 61 y.o. female patient arrived via EMS with nosebleed. Patient is bleeding from the left nostril. Patient state over the weekend she's had 2-3 episodes off in the same nostril. Patient states she was able to stop the previous nosebleed this morning she was unable to stop since onset. Patient denies any trauma. Patient stated after was applied and the nostril by EMS prior to arrival but is not resolved the bleeding. Patient denies use of aspirin all blood thinners. Patient denies any pain.   History reviewed. No pertinent past medical history.  Patient Active Problem List   Diagnosis Date Noted  . Venous ulcer of left leg (HCC) 02/09/2016    Past Surgical History:  Procedure Laterality Date  . CHOLECYSTECTOMY      Prior to Admission medications   Medication Sig Start Date End Date Taking? Authorizing Provider  acetaminophen (TYLENOL) 325 MG tablet Take 650 mg by mouth every 6 (six) hours as needed.    Historical Provider, MD  nitrofurantoin (MACRODANTIN) 100 MG capsule Take 1 capsule (100 mg total) by mouth 2 (two) times daily. 07/09/16   Sharman Cheek, MD  ondansetron (ZOFRAN) 4 MG tablet Take 1 tablet (4 mg total) by mouth daily as needed for nausea or vomiting. 12/10/16   Jene Every, MD  traMADol (ULTRAM) 50 MG tablet Take 1 tablet (50 mg total) by mouth every 6 (six) hours as needed. 12/10/16 12/10/17  Jene Every, MD    Allergies Codeine; Septra ds [sulfamethoxazole-trimethoprim]; and Tramadol  No family history on file.  Social History Social History  Substance Use Topics  . Smoking status: Never Smoker  . Smokeless tobacco: Never Used  . Alcohol use No    Review of  Systems Constitutional: No fever/chills Eyes: No visual changes. ENT: No sore throat.Left nostril bleeding Cardiovascular: Denies chest pain. Respiratory: Denies shortness of breath. Gastrointestinal: No abdominal pain.  No nausea, no vomiting.  No diarrhea.  No constipation. Genitourinary: Negative for dysuria. Musculoskeletal: Negative for back pain. Skin: Negative for rash. Neurological: Negative for headaches, focal weakness or numbness.    ____________________________________________   PHYSICAL EXAM:  VITAL SIGNS: ED Triage Vitals [01/08/17 1203]  Enc Vitals Group     BP (!) 157/78     Pulse Rate 75     Resp 18     Temp 97.8 F (36.6 C)     Temp Source Oral     SpO2 100 %     Weight      Height      Head Circumference      Peak Flow      Pain Score      Pain Loc      Pain Edu?      Excl. in GC?     Constitutional: Alert and oriented. Well appearing and in no acute distress. Eyes: Conjunctivae are normal. PERRL. EOMI. Head: Atraumatic. Nose: No congestion/rhinnorhea. Hemorrhaging from left nostril Mouth/Throat: Mucous membranes are moist.  Oropharynx non-erythematous. Neck: No stridor. No cervical spine tenderness to palpation. Hematological/Lymphatic/Immunilogical: No cervical lymphadenopathy. Cardiovascular: Normal rate, regular rhythm. Grossly normal heart sounds.  Good peripheral circulation. Elevated BP Respiratory: Normal respiratory effort.  No retractions. Lungs CTAB. Gastrointestinal: Soft and nontender. No distention. No abdominal bruits.  No CVA tenderness. Musculoskeletal: No lower extremity tenderness nor edema.  No joint effusions. Neurologic:  Normal speech and language. No gross focal neurologic deficits are appreciated. No gait instability. Skin:  Skin is warm, dry and intact. No rash noted. Psychiatric: Mood and affect are normal. Speech and behavior are normal.  ____________________________________________   LABS (all labs ordered are  listed, but only abnormal results are displayed)  Labs Reviewed - No data to display ____________________________________________  EKG   ____________________________________________  RADIOLOGY   ____________________________________________   PROCEDURES  Procedure(s) performed: None  Procedures  Critical Care performed: No  ____________________________________________   INITIAL IMPRESSION / ASSESSMENT AND PLAN / ED COURSE  Pertinent labs & imaging results that were available during my care of the patient were reviewed by me and considered in my medical decision making (see chart for details).  Left side epistaxis. Patient given discharge care instructions. Patient advised follow-up with the ENT clinic if condition recurs.  Clinical Course    Patient arrived with left sided nasal bleeding. He which was controlled using Afrin/ lidocaine with epinephrine as a nasal packing.  ____________________________________________   FINAL CLINICAL IMPRESSION(S) / ED DIAGNOSES  Final diagnoses:  Left-sided epistaxis      NEW MEDICATIONS STARTED DURING THIS VISIT:  New Prescriptions   No medications on file     Note:  This document was prepared using Dragon voice recognition software and may include unintentional dictation errors.    Joni Reiningonald K Smith, PA-C 01/08/17 1351    Rockne MenghiniAnne-Caroline Norman, MD 01/08/17 1530

## 2017-01-08 NOTE — ED Triage Notes (Signed)
Brought in via ems from work with nosebleed  Denies any trauma

## 2017-01-08 NOTE — ED Notes (Signed)
See triage note was given afrin nasal spray times 4    Active bleeding noted from left nare   Nasal clamp applied

## 2017-01-08 NOTE — Discharge Instructions (Signed)
Apply bacitracin to the left nostril 3-4 times a day and especially before sleeping for 2 days.

## 2017-04-10 ENCOUNTER — Ambulatory Visit
Admission: EM | Admit: 2017-04-10 | Discharge: 2017-04-10 | Disposition: A | Discharge status: Home / Self Care | Payer: BLUE CROSS/BLUE SHIELD | Attending: Family Medicine | Admitting: Family Medicine

## 2017-04-10 ENCOUNTER — Encounter: Payer: Self-pay | Admitting: *Deleted

## 2017-04-10 DIAGNOSIS — R110 Nausea: Secondary | ICD-10-CM | POA: Diagnosis not present

## 2017-04-10 DIAGNOSIS — N390 Urinary tract infection, site not specified: Secondary | ICD-10-CM | POA: Diagnosis not present

## 2017-04-10 DIAGNOSIS — M545 Low back pain: Secondary | ICD-10-CM

## 2017-04-10 LAB — URINALYSIS, COMPLETE (UACMP) WITH MICROSCOPIC
GLUCOSE, UA: NEGATIVE mg/dL
Ketones, ur: NEGATIVE mg/dL
NITRITE: NEGATIVE
PH: 5.5 (ref 5.0–8.0)
Protein, ur: NEGATIVE mg/dL
SPECIFIC GRAVITY, URINE: 1.025 (ref 1.005–1.030)

## 2017-04-10 MED ORDER — NITROFURANTOIN MONOHYD MACRO 100 MG PO CAPS: 100.0000 mg | ORAL_CAPSULE | Freq: Two times a day (BID) | ORAL | 0 refills | Status: DC

## 2017-04-10 MED ORDER — ONDANSETRON 8 MG PO TBDP: 8.0000 mg | ORAL_TABLET | Freq: Three times a day (TID) | ORAL | 0 refills | Status: DC | PRN

## 2017-04-10 NOTE — ED Triage Notes (Signed)
Pt c/o low back pain, nausea, and fever x2 days. States vaginal itching, urine odor, and intermittent urinary incontinence. Was seen at [facility] ED x3 months ago for same symptoms.

## 2017-04-10 NOTE — ED Provider Notes (Signed)
[facility]    CSN: [id number] Arrival date & time: 04/10/17  0901     History   Chief Complaint Chief Complaint  Patient presents with  . Back Pain  . Nausea    HPI [name] is a 62 y.o. female.   The history is provided by the patient.  Dysuria  Pain quality:  Burning Pain severity:  Mild Onset quality:  Sudden Duration:  2 days Timing:  Constant Progression:  Worsening Chronicity:  New Recent urinary tract infections: no   Relieved by:  None tried Ineffective treatments:  None tried Urinary symptoms: foul-smelling urine, frequent urination and incontinence   Associated symptoms: fever, flank pain and nausea   Risk factors: recurrent urinary tract infections   Risk factors: no hx of pyelonephritis, no hx of urolithiasis, no kidney transplant, not pregnant, no renal cysts, no renal disease, no single kidney and no urinary catheter     History reviewed. No pertinent past medical history.  Patient Active Problem List   Diagnosis Date Noted  . Venous ulcer of left leg (HCC) 02/09/2016    Past Surgical History:  Procedure Laterality Date  . CHOLECYSTECTOMY      OB History    No data available       Home Medications    Prior to Admission medications   Medication Sig Start Date End Date Taking? Authorizing Provider  acetaminophen (TYLENOL) 325 MG tablet Take 650 mg by mouth every 6 (six) hours as needed.   Yes Historical Provider, MD  traMADol (ULTRAM) 50 MG tablet Take 1 tablet (50 mg total) by mouth every 6 (six) hours as needed. 12/10/16 12/10/17 Yes Jene Every, MD  nitrofurantoin (MACRODANTIN) 100 MG capsule Take 1 capsule (100 mg total) by mouth 2 (two) times daily. 07/09/16   Sharman Cheek, MD  nitrofurantoin, macrocrystal-monohydrate, (MACROBID) 100 MG capsule Take 1 capsule (100 mg total) by mouth 2 (two) times daily. 04/10/17   Payton Mccallum, MD  ondansetron (ZOFRAN ODT) 8 MG disintegrating tablet Take 1 tablet (8 mg  total) by mouth every 8 (eight) hours as needed. 04/10/17   Payton Mccallum, MD  ondansetron (ZOFRAN) 4 MG tablet Take 1 tablet (4 mg total) by mouth daily as needed for nausea or vomiting. 12/10/16   Jene Every, MD    Family History History reviewed. No pertinent family history.  Social History Social History  Substance Use Topics  . Smoking status: Never Smoker  . Smokeless tobacco: Never Used  . Alcohol use No     Allergies   Codeine; Septra ds [sulfamethoxazole-trimethoprim]; and Tramadol   Review of Systems Review of Systems  Constitutional: Positive for fever.  Gastrointestinal: Positive for nausea.  Genitourinary: Positive for dysuria and flank pain.     Physical Exam Triage Vital Signs ED Triage Vitals  Enc Vitals Group     BP 04/10/17 0930 (!) 152/104     Pulse Rate 04/10/17 0930 (!) 54     Resp 04/10/17 0930 16     Temp 04/10/17 0930 97.7 F (36.5 C)     Temp Source 04/10/17 0930 Oral     SpO2 04/10/17 0930 100 %     Weight 04/10/17 0932 228 lb (103.4 kg)     Height 04/10/17 0932 5' (1.524 m)     Head Circumference --      Peak Flow --      Pain Score --      Pain Loc --      Pain  Edu? --      Excl. in GC? --    No data found.   Updated Vital Signs BP (!) 152/104 (BP Location: Left Arm)   Pulse (!) 54   Temp 97.7 F (36.5 C) (Oral)   Resp 16   Ht 5' (1.524 m)   Wt 228 lb (103.4 kg)   SpO2 100%   BMI 44.53 kg/m   Visual Acuity Right Eye Distance:   Left Eye Distance:   Bilateral Distance:    Right Eye Near:   Left Eye Near:    Bilateral Near:     Physical Exam  Constitutional: She appears well-developed and well-nourished. No distress.  Abdominal: Soft. Bowel sounds are normal. She exhibits no distension and no mass. There is tenderness (mild, suprapubic). There is no rebound and no guarding.  Skin: She is not diaphoretic.  Nursing note and vitals reviewed.    UC Treatments / Results  Labs (all labs ordered are listed, but  only abnormal results are displayed) Labs Reviewed  URINALYSIS, COMPLETE (UACMP) WITH MICROSCOPIC - Abnormal; Notable for the following:       Result Value   APPearance HAZY (*)    Hgb urine dipstick SMALL (*)    Bilirubin Urine SMALL (*)    Leukocytes, UA TRACE (*)    Squamous Epithelial / LPF 0-5 (*)    Bacteria, UA FEW (*)    All other components within normal limits  URINE CULTURE    EKG  EKG Interpretation None       Radiology No results found.  Procedures Procedures (including critical care time)  Medications Ordered in UC Medications - No data to display   Initial Impression / Assessment and Plan / UC Course  I have reviewed the triage vital signs and the nursing notes.  Pertinent labs & imaging results that were available during my care of the patient were reviewed by me and considered in my medical decision making (see chart for details).       Final Clinical Impressions(s) / UC Diagnoses   Final diagnoses:  Lower urinary tract infectious disease    New Prescriptions Discharge Medication List as of 04/10/2017 10:19 AM    START taking these medications   Details  nitrofurantoin, macrocrystal-monohydrate, (MACROBID) 100 MG capsule Take 1 capsule (100 mg total) by mouth 2 (two) times daily., Starting Wed 04/10/2017, Normal    ondansetron (ZOFRAN ODT) 8 MG disintegrating tablet Take 1 tablet (8 mg total) by mouth every 8 (eight) hours as needed., Starting Wed 04/10/2017, Normal       1. Lab results and diagnosis reviewed with patient 2. rx as per orders above; reviewed possible side effects, interactions, risks and benefits  3. Recommend supportive treatment with increase water/fluids 4. Follow-up prn if symptoms worsen or don't improve   Payton Mccallum, MD 04/10/17 1030

## 2017-04-11 LAB — URINE CULTURE: Special Requests: NORMAL

## 2017-06-07 ENCOUNTER — Ambulatory Visit
Admission: EM | Admit: 2017-06-07 | Discharge: 2017-06-07 | Disposition: A | Discharge status: Home / Self Care | Payer: BLUE CROSS/BLUE SHIELD | Attending: Emergency Medicine | Admitting: Emergency Medicine

## 2017-06-07 ENCOUNTER — Encounter: Payer: Self-pay | Admitting: Emergency Medicine

## 2017-06-07 DIAGNOSIS — S39012A Strain of muscle, fascia and tendon of lower back, initial encounter: Secondary | ICD-10-CM | POA: Diagnosis not present

## 2017-06-07 MED ORDER — KETOROLAC TROMETHAMINE 60 MG/2ML IM SOLN
30.0000 mg | Freq: Once | INTRAMUSCULAR | Status: AC
  Administered 2017-06-07: 30 mg via INTRAMUSCULAR

## 2017-06-07 MED ORDER — NAPROXEN 500 MG PO TABS
500.0000 mg | ORAL_TABLET | Freq: Two times a day (BID) | ORAL | 0 refills | Status: DC
Start: 2017-06-07 — End: 2018-04-10

## 2017-06-07 MED ORDER — METAXALONE 800 MG PO TABS: 800.0000 mg | ORAL_TABLET | Freq: Three times a day (TID) | ORAL | 0 refills | Status: DC

## 2017-06-07 NOTE — ED Triage Notes (Signed)
Patient c/o lower back pain for the past 2 days. Patient states that she bent down and felt a pinch in her lower back and has since gotten worse.

## 2017-06-07 NOTE — Discharge Instructions (Signed)
Use ice on your low back 20 minutes out of every 2 hours 4 times daily. Try to avoid irritating your back by not doing activities that cause the back pain. Avoid prolonged sitting lifting or bending. Follow-up with your primary care physician if you are not improving

## 2017-06-07 NOTE — ED Provider Notes (Signed)
CSN: [id number]     Arrival date & time 06/07/17  4098 History   None    Chief Complaint  Patient presents with  . Back Pain   (Consider location/radiation/quality/duration/timing/severity/associated sxs/prior Treatment) HPI This a 61 year old female who presents with low back pain nonradiating for the last 2 days. She states at home she was bending over to pick up a towel she felt a little snap in her back. Since that time it has progressively gotten worse. 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 She denies any radicular symptoms. She states that standing and walking is difficult until she is up for a while and then it begins to hurt again. She denies any incontinence.        Past Medical History:  Diagnosis Date  . Thyroid disease    Past Surgical History:  Procedure Laterality Date  . CHOLECYSTECTOMY     History reviewed. No pertinent family history. Social History  Substance Use Topics  . Smoking status: Never Smoker  . Smokeless tobacco: Never Used  . Alcohol use No   OB History    No data available     Review of Systems  Constitutional: Positive for activity change. Negative for appetite change, chills, fatigue and fever.  Musculoskeletal: Positive for back pain, gait problem and myalgias.  All other systems reviewed and are negative.   Allergies  Codeine; Septra ds [sulfamethoxazole-trimethoprim]; and Tramadol  Home Medications   Prior to Admission medications   Medication Sig Start Date End Date Taking? Authorizing Provider  acetaminophen (TYLENOL) 325 MG tablet Take 650 mg by mouth every 6 (six) hours as needed.    [provider]  metaxalone (SKELAXIN) 800 MG tablet Take 1 tablet (800 mg total) by mouth 3 (three) times daily. Used for muscle spasm 06/07/17   Lutricia Feil, PA-C  naproxen (NAPROSYN) 500 MG tablet Take 1 tablet (500 mg total) by  mouth 2 (two) times daily with a meal. 06/07/17   Lutricia Feil, PA-C  ondansetron (ZOFRAN ODT) 8 MG disintegrating tablet Take 1 tablet (8 mg total) by mouth every 8 (eight) hours as needed. 04/10/17   Payton Mccallum, MD  traMADol (ULTRAM) 50 MG tablet Take 1 tablet (50 mg total) by mouth every 6 (six) hours as needed. 12/10/16 12/10/17  Jene Every, MD   Meds Ordered and Administered this Visit   Medications  ketorolac (TORADOL) injection 30 mg (30 mg Intramuscular Given 06/07/17 0946)    BP (!) 154/75 (BP Location: Right Arm)   Pulse 60   Temp 98.2 F (36.8 C) (Oral)   Resp 16   Ht 5' (1.524 m)   Wt 230 lb (104.3 kg)   SpO2 100%   BMI 44.92 kg/m  No data found.   Physical Exam  Constitutional: She is oriented to person, place, and time. She appears well-developed and well-nourished. No distress.  HENT:  Head: Normocephalic.  Eyes: Pupils are equal, round, and reactive to light.  Neck: Normal range of motion.  Musculoskeletal: She exhibits tenderness.  Examination of the lumbar spine shows a level pelvis in stance but the patient has a decidedly forward list. bilateral flexion is intact although the patient complains of pain to left forward flexion. She is able to toe and heel walk.  Neurological: She is alert and oriented to person, place, and time.  Skin: Skin is warm and dry. She is  not diaphoretic.  Psychiatric: She has a normal mood and affect. Her behavior is normal. Judgment and thought content normal.  Nursing note and vitals reviewed.   Urgent Care Course     Procedures (including critical care time)  Labs Review Labs Reviewed - No data to display  Imaging Review No results found.   Visual Acuity Review  Right Eye Distance:   Left Eye Distance:   Bilateral Distance:    Right Eye Near:   Left Eye Near:    Bilateral Near:     Medications  ketorolac (TORADOL) injection 30 mg (30 mg Intramuscular Given 06/07/17 0946)      MDM   1. Strain of  lumbar region, initial encounter    New Prescriptions   METAXALONE (SKELAXIN) 800 MG TABLET    Take 1 tablet (800 mg total) by mouth 3 (three) times daily. Used for muscle spasm   NAPROXEN (NAPROSYN) 500 MG TABLET    Take 1 tablet (500 mg total) by mouth 2 (two) times daily with a meal.  Plan: 1. Test/x-ray results and diagnosis reviewed with patient 2. rx as per orders; risks, benefits, potential side effects reviewed with patient 3. Recommend supportive treatment with Rest and symptom avoidance. Recommend ice to the low back area 20 minutes out of every 2 hours 4 times daily. She was cautioned regarding use of muscle relaxers with activities requiring concentration and judgment. Not drivable taking the medication. She will follow-up with her primary care physician if she is not improving. 4. F/u prn if symptoms worsen or don't improve     Lutricia FeilRoemer, Evie Croston P, PA-C 06/07/17 [id number]

## 2017-06-07 NOTE — ED Notes (Signed)
Patient shows no signs of adverse reaction to medication at this time.  

## 2017-08-13 ENCOUNTER — Ambulatory Visit
Admission: EM | Admit: 2017-08-13 | Discharge: 2017-08-13 | Disposition: A | Discharge status: Home / Self Care | Payer: BLUE CROSS/BLUE SHIELD | Attending: Family Medicine | Admitting: Family Medicine

## 2017-08-13 ENCOUNTER — Encounter: Payer: Self-pay | Admitting: *Deleted

## 2017-08-13 DIAGNOSIS — L089 Local infection of the skin and subcutaneous tissue, unspecified: Secondary | ICD-10-CM | POA: Diagnosis not present

## 2017-08-13 DIAGNOSIS — T148XXA Other injury of unspecified body region, initial encounter: Secondary | ICD-10-CM | POA: Diagnosis not present

## 2017-08-13 DIAGNOSIS — L03116 Cellulitis of left lower limb: Secondary | ICD-10-CM | POA: Diagnosis not present

## 2017-08-13 HISTORY — DX: Other injury of unspecified body region, initial encounter: T14.8XXA

## 2017-08-13 MED ORDER — DOXYCYCLINE HYCLATE 100 MG PO CAPS: 100.0000 mg | ORAL_CAPSULE | Freq: Two times a day (BID) | ORAL | 0 refills | Status: DC

## 2017-08-13 MED ORDER — MUPIROCIN 2 % EX OINT: 1.0000 "application " | TOPICAL_OINTMENT | Freq: Three times a day (TID) | CUTANEOUS | 0 refills | Status: DC

## 2017-08-13 MED ORDER — NAPROXEN 500 MG PO TABS: 500.0000 mg | ORAL_TABLET | Freq: Two times a day (BID) | ORAL | 0 refills | Status: DC

## 2017-08-13 NOTE — ED Provider Notes (Signed)
[facility]    CSN: [id number] Arrival date & time: 08/13/17  1013     History   Chief Complaint Chief Complaint  Patient presents with  . Wound Infection    HPI [name] is a 61 y.o. female.   HPI This a 61 year old female who is well-known to our clinic. She presents with a wound on her medial distal left leg. Evidently the patient had surgery 2 on that leg to her veins. She has had a chronic wound that causes her problems intermittently. This last occurred towards the end of 2016. She has been at the [place]  [facility] but was not able to return. This would  due to financial problems. She still has very little money to spend on medical care according to her. 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 Is currently not draining any material. Is very tender and warm. She works at [employer] and is on her legs continuously during her shift. This causes her great deal of pain particularly when the wound is active.       Past Medical History:  Diagnosis Date  . Injury, blood vessel     Patient Active Problem List   Diagnosis Date Noted  . Venous ulcer of left leg (HCC) 02/09/2016    Past Surgical History:  Procedure Laterality Date  . CHOLECYSTECTOMY    . VASCULAR SURGERY      OB History    No data available       Home Medications    Prior to Admission medications   Medication Sig Start Date End Date Taking? Authorizing Provider  acetaminophen (TYLENOL) 325 MG tablet Take 650 mg by mouth every 6 (six) hours as needed.   Yes [provider]  naproxen (NAPROSYN) 500 MG tablet Take 1 tablet (500 mg total) by mouth 2 (two) times daily with a meal. 06/07/17  Yes Lutricia Feil, PA-C  doxycycline (VIBRAMYCIN) 100 MG capsule Take 1 capsule (100 mg total) by mouth 2 (two) times daily. 08/13/17   Lutricia Feil, PA-C  metaxalone (SKELAXIN) 800  MG tablet Take 1 tablet (800 mg total) by mouth 3 (three) times daily. Used for muscle spasm 06/07/17   Lutricia Feil, PA-C  mupirocin ointment (BACTROBAN) 2 % Apply 1 application topically 3 (three) times daily. 08/13/17   Lutricia Feil, PA-C  naproxen (NAPROSYN) 500 MG tablet Take 1 tablet (500 mg total) by mouth 2 (two) times daily. 08/13/17   Lutricia Feil, PA-C  ondansetron (ZOFRAN ODT) 8 MG disintegrating tablet Take 1 tablet (8 mg total) by mouth every 8 (eight) hours as needed. 04/10/17   Payton Mccallum, MD  traMADol (ULTRAM) 50 MG tablet Take 1 tablet (50 mg total) by mouth every 6 (six) hours as needed. 12/10/16 12/10/17  Jene Every, MD    Family History History reviewed. No pertinent family history.  Social History Social History  Substance Use Topics  . Smoking status: Never Smoker  . Smokeless tobacco: Never Used  . Alcohol use No     Allergies   Codeine; Septra ds [sulfamethoxazole-trimethoprim]; and Tramadol   Review of Systems Review of Systems  Constitutional: Positive for activity change. Negative for chills, fatigue and fever.  Skin: Positive for wound.  All other systems reviewed and are negative.    Physical Exam Triage Vital Signs ED Triage Vitals  Enc Vitals Group  BP 08/13/17 1111 (!) 151/91     Pulse Rate 08/13/17 1111 69     Resp 08/13/17 1111 16     Temp 08/13/17 1111 97.7 F (36.5 C)     Temp Source 08/13/17 1111 Oral     SpO2 --      Weight 08/13/17 1112 230 lb (104.3 kg)     Height 08/13/17 1112 (!) 5" (0.127 m)     Head Circumference --      Peak Flow --      Pain Score 08/13/17 1113 2     Pain Loc --      Pain Edu? --      Excl. in GC? --    No data found.   Updated Vital Signs BP (!) 151/91 (BP Location: Right Arm)   Pulse 69   Temp 97.7 F (36.5 C) (Oral)   Resp 16   Ht (!) 5" (0.127 m)   Wt 230 lb (104.3 kg)   BMI 6468.31 kg/m   Visual Acuity Right Eye Distance:   Left Eye Distance:   Bilateral  Distance:    Right Eye Near:   Left Eye Near:    Bilateral Near:     Physical Exam  Constitutional: She is oriented to person, place, and time. She appears well-developed and well-nourished. No distress.  HENT:  Head: Normocephalic.  Eyes: Pupils are equal, round, and reactive to light. Right eye exhibits no discharge. Left eye exhibits no discharge.  Neck: Normal range of motion.  Musculoskeletal: Normal range of motion.  Neurological: She is alert and oriented to person, place, and time.  Skin: Skin is warm and dry. Rash noted. She is not diaphoretic.  Refer to photographs for detail. Is a large hemosiderin stain of the distal medial left leg. This is chronic according to the patient and is present at all times. In the center of the stain is a open wound that is nondraining. It is extremely tender to the touch and slightly warm. Because of the hemosiderin stained it is impossible to disconcern any erythema. The calf is nontender no warmth is present. There are no cords felt. Pulses are 2+ in the dorsalis pedis and posterior tibialis distribution. Is normal sensation distally.  Psychiatric: She has a normal mood and affect. Her behavior is normal. Judgment and thought content normal.  Nursing note and vitals reviewed.        UC Treatments / Results  Labs (all labs ordered are listed, but only abnormal results are displayed) Labs Reviewed - No data to display  EKG  EKG Interpretation None       Radiology No results found.  Procedures Procedures (including critical care time)  Medications Ordered in UC Medications - No data to display   Initial Impression / Assessment and Plan / UC Course  I have reviewed the triage vital signs and the nursing notes.  Pertinent labs & imaging results that were available during my care of the patient were reviewed by me and considered in my medical decision making (see chart for details).     Plan: 1. Test/x-ray results and  diagnosis reviewed with patient 2. rx as per orders; risks, benefits, potential side effects reviewed with patient 3. Recommend supportive treatment with elevation as necessary control swelling. Wash wound 3 times daily; dry thoroughly and apply Bactroban ointment. Also start her on doxycycline for infection. Patient will need to be seen by wound care specialist. The patient would prefer to return to [facility] and speak with Dr. Letta Pate about possibly securing financial assistance for her medical problems. I would also recommend that she be measured 4 compression hose that would help with the swelling since her job entails standing for prolonged periods of time. She has very obese legs and stock compression hose would not likely fit properly. 4. F/u prn if symptoms worsen or don't improve   Final Clinical Impressions(s) / UC Diagnoses   Final diagnoses:  Cellulitis of leg, left  Wound infection    New Prescriptions Discharge Medication List as of 08/13/2017 12:28 PM    START taking these medications   Details  doxycycline (VIBRAMYCIN) 100 MG capsule Take 1 capsule (100 mg total) by mouth 2 (two) times daily., Starting Tue 08/13/2017, Normal    mupirocin ointment (BACTROBAN) 2 % Apply 1 application topically 3 (three) times daily., Starting Tue 08/13/2017, Normal       Naprosyn 500 mg twice a day with food.  Controlled Substance Prescriptions [place] Controlled Substance Registry consulted? Not Applicable   Lutricia Feil, PA-C 08/13/17 1249

## 2017-08-13 NOTE — Discharge Instructions (Signed)
Elevate leg as much as necessary to control swelling. Recommend having compression hose measured for you to use at work to prevent swelling. You will need to follow-up with a wound care center for the chronic wound. This may be arranged through [facility] with Dr. Letta Pate.

## 2017-08-13 NOTE — ED Triage Notes (Signed)
Vascular surgery x2 to left leg.

## 2017-08-13 NOTE — ED Triage Notes (Signed)
Wound Vessel to left leg. Had surgery x2 on that left  in the past years. Starting having infection symptoms  Two weeks ago

## 2017-09-19 ENCOUNTER — Other Ambulatory Visit: Payer: Self-pay

## 2017-09-19 DIAGNOSIS — I83029 Varicose veins of left lower extremity with ulcer of unspecified site: Secondary | ICD-10-CM

## 2017-09-19 DIAGNOSIS — L97929 Non-pressure chronic ulcer of unspecified part of left lower leg with unspecified severity: Principal | ICD-10-CM

## 2017-10-04 ENCOUNTER — Encounter: Payer: BLUE CROSS/BLUE SHIELD | Attending: Surgery | Admitting: Surgery

## 2017-10-04 DIAGNOSIS — H9192 Unspecified hearing loss, left ear: Secondary | ICD-10-CM | POA: Diagnosis not present

## 2017-10-04 DIAGNOSIS — Z87891 Personal history of nicotine dependence: Secondary | ICD-10-CM | POA: Insufficient documentation

## 2017-10-04 DIAGNOSIS — Z6841 Body Mass Index (BMI) 40.0 and over, adult: Secondary | ICD-10-CM | POA: Insufficient documentation

## 2017-10-04 DIAGNOSIS — Z885 Allergy status to narcotic agent status: Secondary | ICD-10-CM | POA: Insufficient documentation

## 2017-10-04 DIAGNOSIS — I739 Peripheral vascular disease, unspecified: Secondary | ICD-10-CM | POA: Insufficient documentation

## 2017-10-04 DIAGNOSIS — L97222 Non-pressure chronic ulcer of left calf with fat layer exposed: Secondary | ICD-10-CM | POA: Diagnosis not present

## 2017-10-04 DIAGNOSIS — M1990 Unspecified osteoarthritis, unspecified site: Secondary | ICD-10-CM | POA: Insufficient documentation

## 2017-10-04 DIAGNOSIS — I10 Essential (primary) hypertension: Secondary | ICD-10-CM | POA: Diagnosis not present

## 2017-10-04 DIAGNOSIS — I83222 Varicose veins of left lower extremity with both ulcer of calf and inflammation: Secondary | ICD-10-CM | POA: Diagnosis present

## 2017-10-06 NOTE — Progress Notes (Signed)
Aggarwal, [name] Visit Report for 10/04/2017 Allergy List Details Patient Name: [name], [name] Date of Service: 10/04/2017 12:30 PM Medical Record Number: [id number] Patient Account Number: [id number] Date of Birth/Sex: 05-29-1956 (61 y.o. Female) Treating RN: Huel Coventry Primary Care Marqueta Pulley: Karie Fetch Other Clinician: Referring Margean Korell: Karie Fetch Treating Finesse Fielder/Extender: Rudene Re in Treatment: 0 Allergies Active Allergies codeine Reaction: "stomache burn" Allergy Notes Electronic Signature(s) Signed: 10/04/2017 4:29:15 PM By: Elliot Gurney, BSN, RN, CWS, Kim RN, BSN Entered By: Elliot Gurney, BSN, RN, CWS, Kim on 10/04/2017 12:59:19 Melamed, [name] ([id number]) -------------------------------------------------------------------------------- Arrival Information Details Patient Name: [name], [name] Date of Service: 10/04/2017 12:30 PM Medical Record Number: [id number] Patient Account Number: [id number] Date of Birth/Sex: [birth year] (61 y.o. Female) Treating RN: Huel Coventry Primary Care Zori Benbrook: Karie Fetch Other Clinician: Referring Jennah Satchell: Karie Fetch Treating Zachari Alberta/Extender: Rudene Re in Treatment: 0 Visit Information Patient Arrived: Ambulatory Arrival Time: 12:56 Accompanied By: self Transfer Assistance: None Patient Identification Verified: Yes Secondary Verification Process Yes Completed: Patient Requires Transmission- No Based Precautions: Patient Has Alerts: Yes Patient Alerts: Patient on Blood Thinner NOT DIABETIC History Since Last Visit Electronic Signature(s) Signed: 10/04/2017 4:29:15 PM By: Elliot Gurney, BSN, RN, CWS, Kim RN, BSN Entered By: Elliot Gurney, BSN, RN, CWS, Kim on 10/04/2017 12:57:48 Ahlquist, [name] ([id number]) -------------------------------------------------------------------------------- Clinic Level of Care Assessment Details Patient Name: [name], [name] Date of Service:  10/04/2017 12:30 PM Medical Record Number: [id number] Patient Account Number: [id number] Date of Birth/Sex: 08-17-1956 (61 y.o. Female) Treating RN: Huel Coventry Primary Care Rama Mcclintock: Karie Fetch Other Clinician: Referring Chaos Carlile: Karie Fetch Treating Mattthew Ziomek/Extender: Rudene Re in Treatment: 0 Clinic Level of Care Assessment Items TOOL 1 Quantity Score  - Use when EandM and Procedure is performed on INITIAL visit 0 ASSESSMENTS - Nursing Assessment / Reassessment X - General Physical Exam (combine w/ comprehensive assessment (listed just 1 20 below) when performed on new pt. evals) X - Comprehensive Assessment (HX, ROS, Risk Assessments, Wounds Hx, etc.) 1 25 ASSESSMENTS - Wound and Skin Assessment / Reassessment  - Dermatologic / Skin Assessment (not related to wound area) 0 ASSESSMENTS - Ostomy and/or Continence Assessment and Care  - Incontinence Assessment and Management 0  - Ostomy Care Assessment and Management (repouching, etc.) 0 PROCESS - Coordination of Care X - Simple Patient / Family Education for ongoing care 1 15  - Complex (extensive) Patient / Family Education for ongoing care 0 X - Staff obtains Chiropractor, Records, Test Results / Process Orders 1 10  - Staff telephones HHA, Nursing Homes / Clarify orders / etc 0  - Routine Transfer to another Facility (non-emergent condition) 0  - Routine Hospital Admission (non-emergent condition) 0 X - New Admissions / Manufacturing engineer / Ordering NPWT, Apligraf, etc. 1 15  - Emergency Hospital Admission (emergent condition) 0 PROCESS - Special Needs  - Pediatric / Minor Patient Management 0  - Isolation Patient Management 0 [name], [name] ([id number])  - Hearing / Language / Visual special needs 0  - Assessment of Community assistance (transportation, D/C planning, etc.) 0  - Additional assistance / Altered mentation 0  - Support Surface(s) Assessment (bed, cushion, seat,  etc.) 0 INTERVENTIONS - Miscellaneous  - External ear exam 0  - Patient Transfer (multiple staff / Nurse, adult / Similar devices) 0  - Simple Staple / Suture removal (25 or less) 0  - Complex Staple / Suture removal (26 or more) 0  - Hypo/Hyperglycemic Management (do not check if billed separately) 0 X -  Ankle / Brachial Index (ABI) - do not check if billed separately 1 15 Has the patient been seen at the hospital within the last three years: Yes Total Score: 100 Level Of Care: New/Established - Level 3 Electronic Signature(s) Signed: 10/04/2017 4:29:15 PM By: Elliot Gurney, BSN, RN, CWS, Kim RN, BSN Entered By: Elliot Gurney, BSN, RN, CWS, Kim on 10/04/2017 13:48:24 [name], [name] ([id number]) -------------------------------------------------------------------------------- Compression Therapy Details Patient Name: [name], [name] Date of Service: 10/04/2017 12:30 PM Medical Record Number: [id number] Patient Account Number: [id number] Date of Birth/Sex: [birth year] (61 y.o. Female) Treating RN: Huel Coventry Primary Care Delrico Minehart: Karie Fetch Other Clinician: Referring Nakenya Theall: Karie Fetch Treating Breanda Greenlaw/Extender: Rudene Re in Treatment: 0 Compression Therapy Performed for Wound Wound #3 Left,Medial,Anterior Lower Leg Assessment: Performed By: Clinician Huel Coventry, RN Compression Type: Three Layer Pre Treatment ABI: 1.2 Post Procedure Diagnosis Same as Pre-procedure Electronic Signature(s) Signed: 10/04/2017 4:29:15 PM By: Elliot Gurney, BSN, RN, CWS, Kim RN, BSN Entered By: Elliot Gurney, BSN, RN, CWS, Kim on 10/04/2017 13:48:01 Everett, [name] ([id number]) -------------------------------------------------------------------------------- Encounter Discharge Information Details Patient Name: [name], [name] Date of Service: 10/04/2017 12:30 PM Medical Record Number: [id number] Patient Account Number: [id number] Date of Birth/Sex: [birth year] (61 y.o. Female) Treating  RN: Huel Coventry Primary Care Kalyiah Saintil: Karie Fetch Other Clinician: Referring Zayda Angell: Karie Fetch Treating Raygen Dahm/Extender: Rudene Re in Treatment: 0 Encounter Discharge Information Items Discharge Pain Level: 0 Discharge Condition: Stable Ambulatory Status: Ambulatory Discharge Destination: Home Transportation: Private Auto Accompanied By: self Schedule Follow-up Appointment: Yes Medication Reconciliation completed and provided to Patient/Care Yes Elleen Coulibaly: Provided on Clinical Summary of Care: 10/04/2017 Form Type Recipient Paper Patient GR Electronic Signature(s) Signed: 10/04/2017 4:29:15 PM By: Elliot Gurney, BSN, RN, CWS, Kim RN, BSN Entered By: Elliot Gurney, BSN, RN, CWS, Kim on 10/04/2017 13:49:28 Ickes, [name] ([id number]) -------------------------------------------------------------------------------- Lower Extremity Assessment Details Patient Name: Villella, [name] Date of Service: 10/04/2017 12:30 PM Medical Record Number: [id number] Patient Account Number: [id number] Date of Birth/Sex: 20-Jun-1956 (61 y.o. Female) Treating RN: Huel Coventry Primary Care Leather Estis: Karie Fetch Other Clinician: Referring Maeby Vankleeck: Karie Fetch Treating Dionisio Aragones/Extender: Rudene Re in Treatment: 0 Edema Assessment Assessed: [Left: No] [Right: No] E[Left: dema] [Right: :] Calf Left: Right: Point of Measurement: 30 cm From Medial Instep 49.5 cm 50 cm Ankle Left: Right: Point of Measurement: 10 cm From Medial Instep 25 cm 23.6 cm Vascular Assessment Pulses: Dorsalis Pedis Palpable: [Left:Yes] [Right:Yes] Doppler Audible: [Left:Yes] [Right:Yes] Posterior Tibial Extremity colors, hair growth, and conditions: Extremity Color: [Left:Normal] [Right:Red] Hair Growth on Extremity: [Left:No] [Right:No] Temperature of Extremity: [Left:Warm] [Right:Warm] Capillary Refill: [Left:< 3 seconds] [Right:< 3 seconds] Blood Pressure: Brachial: [Left:150]  [Right:150] Dorsalis Pedis: 178 [Left:Dorsalis Pedis: 170] Ankle: Posterior Tibial: 170 [Left:Posterior Tibial: 170 1.19] [Right:1.13] Toe Nail Assessment Left: Right: Thick: No No Discolored: No No Deformed: No No Improper Length and Hygiene: No No Electronic Signature(s) Emond, [name] ([id number]) Signed: 10/04/2017 4:29:15 PM By: Elliot Gurney, BSN, RN, CWS, Kim RN, BSN Entered By: Elliot Gurney, BSN, RN, CWS, Kim on 10/04/2017 13:17:21 Grode, [name] ([id number]) -------------------------------------------------------------------------------- Multi Wound Chart Details Patient Name: Mccambridge, [name] Date of Service: 10/04/2017 12:30 PM Medical Record Number: [id number] Patient Account Number: [id number] Date of Birth/Sex: Sep 13, 1956 (60 y.o. Female) Treating RN: Huel Coventry Primary Care Torion Hulgan: Karie Fetch Other Clinician: Referring Wally Shevchenko: Karie Fetch Treating Ralphael Southgate/Extender: Rudene Re in Treatment: 0 Vital Signs Height(in): 60 Pulse(bpm): 64 Weight(lbs): 230 Blood Pressure 161/90 (mmHg): Body Mass Index(BMI): 45 Temperature(F): 97.7 Respiratory Rate 16 (breaths/min): Photos: [N/A:N/A] Wound  Location: Left Lower Leg - Medial, N/A N/A Anterior Wounding Event: Gradually Appeared N/A N/A Primary Etiology: Venous Leg Ulcer N/A N/A Comorbid History: Middle ear problems, N/A N/A Hypertension, Peripheral Venous Disease, Osteoarthritis Date Acquired: 07/29/2017 N/A N/A Weeks of Treatment: 0 N/A N/A Wound Status: Open N/A N/A Measurements L x W x D 2.5x2x0.2 N/A N/A (cm) Area (cm) : 3.927 N/A N/A Volume (cm) : 0.785 N/A N/A Classification: Full Thickness Without N/A N/A Exposed Support Structures Exudate Amount: Medium N/A N/A Exudate Type: Serosanguineous N/A N/A Exudate Color: red, brown N/A N/A Wound Margin: Flat and Intact N/A N/A Granulation Amount: Small (1-33%) N/A N/A Granulation Quality: Red, Hyper-granulation N/A  N/A Crounse, [name] ([id number]) Necrotic Amount: Medium (34-66%) N/A N/A Exposed Structures: Fat Layer (Subcutaneous N/A N/A Tissue) Exposed: Yes Fascia: No Tendon: No Muscle: No Joint: No Bone: No Epithelialization: None N/A N/A Periwound Skin Texture: Induration: Yes N/A N/A Excoriation: No Callus: No Crepitus: No Rash: No Scarring: No Periwound Skin Maceration: No N/A N/A Moisture: Dry/Scaly: No Periwound Skin Color: Hemosiderin Staining: Yes N/A N/A Atrophie Blanche: No Cyanosis: No Ecchymosis: No Erythema: No Mottled: No Pallor: No Rubor: No Temperature: No Abnormality N/A N/A Tenderness on No N/A N/A Palpation: Wound Preparation: Ulcer Cleansing: N/A N/A Rinsed/Irrigated with Saline Topical Anesthetic Applied: Other: lidocaine 4% Treatment Notes Electronic Signature(s) Signed: 10/04/2017 4:29:15 PM By: Elliot Gurney, BSN, RN, CWS, Kim RN, BSN Previous Signature: 10/04/2017 1:37:26 PM Version By: Evlyn Kanner MD, FACS Entered By: Elliot Gurney, BSN, RN, CWS, Kim on 10/04/2017 13:47:37 Cogan, [name] ([id number]) -------------------------------------------------------------------------------- Multi-Disciplinary Care Plan Details Patient Name: [name], [name] Date of Service: 10/04/2017 12:30 PM Medical Record Number: [id number] Patient Account Number: [id number] Date of Birth/Sex: May 22, 1956 (61 y.o. Female) Treating RN: Huel Coventry Primary Care Corby Villasenor: Karie Fetch Other Clinician: Referring Carrin Vannostrand: Karie Fetch Treating Ayisha Pol/Extender: Rudene Re in Treatment: 0 Active Inactive ` Orientation to the Wound Care Program Nursing Diagnoses: Knowledge deficit related to the wound healing center program Goals: Patient/caregiver will verbalize understanding of the Wound Healing Center Program Date Initiated: 10/04/2017 Target Resolution Date: 10/21/2017 Goal Status: Active Interventions: Provide education on orientation to the wound  center Notes: ` Venous Leg Ulcer Nursing Diagnoses: Actual venous Insuffiency (use after diagnosis is confirmed) Goals: Patient will maintain optimal edema control Date Initiated: 10/04/2017 Target Resolution Date: 10/21/2017 Goal Status: Active Interventions: Assess peripheral edema status every visit. Treatment Activities: Non-invasive vascular studies : 10/04/2017 Notes: ` Wound/Skin Impairment Aquilar, [name] ([id number]) Nursing Diagnoses: Impaired tissue integrity Goals: Ulcer/skin breakdown will have a volume reduction of 30% by week 4 Date Initiated: 10/04/2017 Target Resolution Date: 11/04/2017 Goal Status: Active Interventions: Assess ulceration(s) every visit Treatment Activities: Skin care regimen initiated : 10/04/2017 Topical wound management initiated : 10/04/2017 Notes: Electronic Signature(s) Signed: 10/04/2017 4:29:15 PM By: Elliot Gurney, BSN, RN, CWS, Kim RN, BSN Entered By: Elliot Gurney, BSN, RN, CWS, Kim on 10/04/2017 13:47:22 Kirley, [name] ([id number]) -------------------------------------------------------------------------------- Pain Assessment Details Patient Name: [name], [name] Date of Service: 10/04/2017 12:30 PM Medical Record Number: [id number] Patient Account Number: [id number] Date of Birth/Sex: 20-Jan-1956 (61 y.o. Female) Treating RN: Huel Coventry Primary Care Marielena Harvell: Karie Fetch Other Clinician: Referring Tracee Mccreery: Karie Fetch Treating Kjell Brannen/Extender: Rudene Re in Treatment: 0 Active Problems Location of Pain Severity and Description of Pain Patient Has Paino Yes Site Locations Pain Location: Pain in Ulcers Duration of the Pain. Constant / Intermittento Intermittent Rate the pain. Current Pain Level: 5 Character of Pain Describe the Pain: Throbbing Pain Management  and Medication Current Pain Management: Goals for Pain Management Topical or injectable lidocaine is offered to patient for acute pain when  surgical debridement is performed. If needed, Patient is instructed to use over the counter pain medication for the following 24-48 hours after debridement. Wound care MDs do not prescribed pain medications. Patient has chronic pain or uncontrolled pain. Patient has been instructed to make an appointment with their Primary Care Physician for pain management. Electronic Signature(s) Signed: 10/04/2017 4:29:15 PM By: Elliot Gurney, BSN, RN, CWS, Kim RN, BSN Entered By: Elliot Gurney, BSN, RN, CWS, Kim on 10/04/2017 12:58:14 Bartl, [name] ([id number]) -------------------------------------------------------------------------------- Patient/Caregiver Education Details Patient Name: Ruvalcaba, [name] Date of Service: 10/04/2017 12:30 PM Medical Record Number: [id number] Patient Account Number: [id number] Date of Birth/Gender: 1956/05/17 (61 y.o. Female) Treating RN: Huel Coventry Primary Care Physician: Karie Fetch Other Clinician: Referring Physician: Karie Fetch Treating Physician/Extender: Rudene Re in Treatment: 0 Education Assessment Education Provided To: Patient Education Topics Provided Venous: Handouts: Controlling Swelling with Multilayered Compression Wraps, Other: do not get wet Methods: Demonstration, Explain/Verbal Responses: State content correctly Electronic Signature(s) Signed: 10/04/2017 4:29:15 PM By: Elliot Gurney, BSN, RN, CWS, Kim RN, BSN Entered By: Elliot Gurney, BSN, RN, CWS, Kim on 10/04/2017 13:49:48 Waid, [name] ([id number]) -------------------------------------------------------------------------------- Wound Assessment Details Patient Name: Swiatek, [name] Date of Service: 10/04/2017 12:30 PM Medical Record Number: [id number] Patient Account Number: [id number] Date of Birth/Sex: 1956-02-15 (61 y.o. Female) Treating RN: Huel Coventry Primary Care Maijor Hornig: Karie Fetch Other Clinician: Referring Lache Dagher: Karie Fetch Treating Goerge Mohr/Extender: Rudene Re in Treatment: 0 Wound Status Wound Number: 3 Primary Venous Leg Ulcer Etiology: Wound Location: Left Lower Leg - Medial, Anterior Wound Open Status: Wounding Event: Gradually Appeared Comorbid Middle ear problems, Hypertension, Date Acquired: 07/29/2017 History: Peripheral Venous Disease, Weeks Of Treatment: 0 Osteoarthritis Clustered Wound: No Photos Wound Measurements Length: (cm) 2.5 Width: (cm) 2 Depth: (cm) 0.2 Area: (cm) 3.927 Volume: (cm) 0.785 % Reduction in Area: % Reduction in Volume: Epithelialization: None Tunneling: No Undermining: No Wound Description Full Thickness Without Exposed Classification: Support Structures Wound Margin: Flat and Intact Exudate Medium Amount: Exudate Type: Serosanguineous Exudate Color: red, brown Foul Odor After Cleansing: No Slough/Fibrino Yes Wound Bed Granulation Amount: Small (1-33%) Exposed Structure Granulation Quality: Red, Hyper-granulation Fascia Exposed: No Necrotic Amount: Medium (34-66%) Fat Layer (Subcutaneous Tissue) Exposed: Yes Necrotic Quality: Adherent Slough Tendon Exposed: No Muscle Exposed: No [place]brooks, [name] ([id number]) Joint Exposed: No Bone Exposed: No Periwound Skin Texture Texture Color No Abnormalities Noted: No No Abnormalities Noted: No Callus: No Atrophie Blanche: No Crepitus: No Cyanosis: No Excoriation: No Ecchymosis: No Induration: Yes Erythema: No Rash: No Hemosiderin Staining: Yes Scarring: No Mottled: No Pallor: No Moisture Rubor: No No Abnormalities Noted: No Dry / Scaly: No Temperature / Pain Maceration: No Temperature: No Abnormality Wound Preparation Ulcer Cleansing: Rinsed/Irrigated with Saline Topical Anesthetic Applied: Other: lidocaine 4%, Treatment Notes Wound #3 (Left, Medial, Anterior Lower Leg) 1. Cleansed with: Clean wound with Normal Saline 2. Anesthetic Topical Lidocaine 4% cream to wound bed prior to debridement 3.  Peri-wound Care: Antifungal cream 4. Dressing Applied: Other dressing (specify in notes) 5. Secondary Dressing Applied ABD Pad 7. Secured with 3 Layer Compression System - Left Lower Extremity Electronic Signature(s) Signed: 10/04/2017 4:29:15 PM By: Elliot Gurney, BSN, RN, CWS, Kim RN, BSN Entered By: Elliot Gurney, BSN, RN, CWS, Kim on 10/04/2017 13:08:37 Papillion, [name] ([id number]) -------------------------------------------------------------------------------- Vitals Details Patient Name: Josephson, [name] Date of Service: 10/04/2017 12:30 PM Medical Record Number: [id number]  Patient Account Number: [id number] Date of Birth/Sex: November 17, 1956 (61 y.o. Female) Treating RN: Huel Coventry Primary Care Carlon Davidson: Karie Fetch Other Clinician: Referring Alexarae Oliva: Karie Fetch Treating Penny Arrambide/Extender: Rudene Re in Treatment: 0 Vital Signs Time Taken: 12:58 Temperature (F): 97.7 Height (in): 60 Pulse (bpm): 64 Weight (lbs): 230 Respiratory Rate (breaths/min): 16 Source: Measured Blood Pressure (mmHg): 161/90 Body Mass Index (BMI): 44.9 Reference Range: 80 - 120 mg / dl Notes Patient to follow up with PCP for BP. Electronic Signature(s) Signed: 10/04/2017 4:29:15 PM By: Elliot Gurney, BSN, RN, CWS, Kim RN, BSN Entered By: Elliot Gurney, BSN, RN, CWS, Kim on 10/04/2017 12:59:10

## 2017-10-06 NOTE — Progress Notes (Signed)
[name], [name] A. ([id number]) Visit Report for 10/04/2017 Chief Complaint Document Details Patient Name: [name] [name] A. 10/04/2017 12:30 Date of Service: PM Medical Record [id number] Number: Patient Account Number: [id number] 23-Jan-1956 (61 y.o. Treating RN: Huel Coventry Date of Birth/Sex: Female) Other Clinician: Primary Care Provider: Karie Fetch Treating Evlyn Kanner Referring Provider: Karie Fetch Provider/Extender: Weeks in Treatment: 0 Information Obtained from: Patient Chief Complaint Patient presents for treatment of an open ulcer due to venous insufficiency which she's had for several months. she is a return patient who we had seen several years ago and has not been able to get appropriate treatment due to the lack of insurance. Electronic Signature(s) Signed: 10/04/2017 1:37:58 PM By: Evlyn Kanner MD, FACS Entered By: Evlyn Kanner on 10/04/2017 13:37:58 [name], [name] A. ([id number]) -------------------------------------------------------------------------------- HPI Details Patient Name: [name], [name] A. 10/04/2017 12:30 Date of Service: PM Medical Record [id number] Number: Patient Account Number: [id number] [birth year] (61 y.o. Treating RN: Huel Coventry Date of Birth/Sex: Female) Other Clinician: Primary Care Provider: Karie Fetch Treating Evlyn Kanner Referring Provider: Karie Fetch Provider/Extender: Weeks in Treatment: 0 History of Present Illness Location: swelling of the left lower extremity with ulceration Quality: Patient reports experiencing a dull pain to affected area(s). Severity: Patient states wound are getting worse. Duration: Patient has had the wound for > 3 months prior to seeking treatment at the wound center Timing: Pain in wound is Intermittent (comes and goes Context: The wound would happen gradually Modifying Factors: Other treatment(s) tried include:Neosporin ointment locally Associated Signs and Symptoms: Patient  reports having increase discharge. HPI Description: 61 year old patient who is known to our practice from a previous visit here in 2015 and 2017 for a similar problem. That [place] she was lost to follow-up because she did not have insurance and did not complete a venous ablation. I understand she has now obtained insurance and has an appointment to see Dr. Hart [facility] at [facility] in [place], on November 5th. Other than that the patient has had no other issues ======= Old notes This patient was been previously seen at the wound center is being seen by me for the first time today. 63F with h/o lower extremity swelling presents with a left lower extremity ulcer (first presented to [place] on 07/12/14) . the patient is deaf and lip reads and is fairly conversant. She has had venous ablation in 2015 but due to lack of insurance and money she has been unable to follow-up with the vascular surgeons not she been wearing compression garments. Does not have a past medical history of diabetes or other issues. He has not been wearing compression stockings and we did try to get her Velcro wraps but she is unable to afford the copayment. Addendum: I have found some reports from August 2015 when an arterial study was done and the left lower extremity had triphasic waveform showing no arterial insufficiency. She was also to found to have left greater saphenous vein reflux throughout with tortuous veins and on 08/27/2014 she had a laser ablation done. At a follow-up on September 8 it was found that she needed a second procedure as a first procedure was not completely successful. Due to the patient's insurance issues she has not gone back for the second procedure. 03/02/2016 -- he is here with the interpreter today and we have had a thorough discussion about all the above and all questions answered. ========== [name], [name] A. ([id number]) Electronic Signature(s) Signed: 10/04/2017 1:39:46 PM By:  Evlyn Kanner MD, FACS Previous Signature: 10/04/2017 1:19:11  PM Version By: Evlyn Kanner MD, FACS Entered By: Evlyn Kanner on 10/04/2017 13:39:46 [name], [name] A. ([id number]) -------------------------------------------------------------------------------- Physical Exam Details Patient Name: [name], [name] A. 10/04/2017 12:30 Date of Service: PM Medical Record [id number] Number: Patient Account Number: [id number] Jan 03, 1956 (61 y.o. Treating RN: Huel Coventry Date of Birth/Sex: Female) Other Clinician: Primary Care Provider: Karie Fetch Treating Niang Mitcheltree Referring Provider: Karie Fetch Provider/Extender: Weeks in Treatment: 0 Constitutional . Pulse regular. Respirations normal and unlabored. Afebrile. . Eyes Nonicteric. Reactive to light. Ears, Nose, Mouth, and Throat Lips, teeth, and gums WNL.Marland Kitchen Moist mucosa without lesions. Neck supple and nontender. No palpable supraclavicular or cervical adenopathy. Normal sized without goiter. Respiratory WNL. No retractions.. Cardiovascular Pedal Pulses WNL. ABI on the left is 1.19 and on the right is 1.13. No clubbing, cyanosis or edema. Chest Breasts symmetical and no nipple discharge.. Breast tissue WNL, no masses, lumps, or tenderness.. Gastrointestinal (GI) Abdomen without masses or tenderness.. No liver or spleen enlargement or tenderness.. Lymphatic No adneopathy. No adenopathy. No adenopathy. Musculoskeletal Adexa without tenderness or enlargement.. Digits and nails w/o clubbing, cyanosis, infection, petechiae, ischemia, or inflammatory conditions.. Integumentary (Hair, Skin) No suspicious lesions. No crepitus or fluctuance. No peri-wound warmth or erythema. No masses.Marland Kitchen Psychiatric Judgement and insight Intact.. No evidence of depression, anxiety, or agitation.. Notes the left lower extremity shows typical stigmata of varicose veins with hemosiderin deposits and ulceration with inflammation. There is also a  significant amount of fungal infection in this area. She also seems to have developed a reaction to the Neosporin ointment and has contact dermatitis Electronic Signature(s) Signed: 10/04/2017 1:40:35 PM By: Evlyn Kanner MD, FACS [name], [name] A. ([id number]) Entered By: Evlyn Kanner on 10/04/2017 13:40:35 [name], [name] A. ([id number]) -------------------------------------------------------------------------------- Physician Orders Details Patient Name: [name], [name] A. 10/04/2017 12:30 Date of Service: PM Medical Record [id number] Number: Patient Account Number: [id number] [birth year] (61 y.o. Treating RN: Huel Coventry Date of Birth/Sex: Female) Other Clinician: Primary Care Provider: Karie Fetch Treating Evlyn Kanner Referring Provider: Karie Fetch Provider/Extender: Weeks in Treatment: 0 Verbal / Phone Orders: No Diagnosis Coding Wound Cleansing Wound #3 Left,Medial,Anterior Lower Leg o May Shower, gently pat wound dry prior to applying new dressing. Anesthetic Wound #3 Left,Medial,Anterior Lower Leg o Topical Lidocaine 4% cream applied to wound bed prior to debridement Skin Barriers/Peri-Wound Care Wound #3 Left,Medial,Anterior Lower Leg o Antifungal cream Primary Wound Dressing Wound #3 Left,Medial,Anterior Lower Leg o Silvercel Non-Adherent Secondary Dressing Wound #3 Left,Medial,Anterior Lower Leg o ABD pad Dressing Change Frequency Wound #3 Left,Medial,Anterior Lower Leg o Change dressing every week Follow-up Appointments Wound #3 Left,Medial,Anterior Lower Leg o Return Appointment in 1 week. o Nurse Visit as needed Edema Control Wound #3 Left,Medial,Anterior Lower Leg o 3 Layer Compression System - Left Lower Extremity [name], [name] A. ([id number]) Electronic Signature(s) Signed: 10/04/2017 4:18:43 PM By: Evlyn Kanner MD, FACS Signed: 10/04/2017 4:29:15 PM By: Elliot Gurney, BSN, RN, CWS, Kim RN, BSN Entered By: Elliot Gurney, BSN,  RN, CWS, Kim on 10/04/2017 13:31:49 [name], [name] A. ([id number]) -------------------------------------------------------------------------------- Problem List Details Patient Name: [name], [name] A. 10/04/2017 12:30 Date of Service: PM Medical Record [id number] Number: Patient Account Number: [id number] 1956-11-06 (61 y.o. 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 ([id number]) -------------------------------------------------------------------------------- Progress Note Details Patient Name: [name], [name] A. 10/04/2017 12:30 Date of Service: PM Medical Record [id number] Number: Patient Account Number: [id number] 06/20/1956 (61 y.o. Treating RN: Huel Coventry Date of Birth/Sex: Female) Other Clinician: Primary Care Provider: Karie Fetch Treating Evlyn Kanner Referring Provider: Karie Fetch Provider/Extender: Tania Ade in Treatment: 0 Subjective Chief Complaint Information obtained from Patient Patient presents for treatment of an open ulcer due to venous insufficiency which she's had for several months. she is a return patient who we had seen several years ago and has not been able to get appropriate treatment due to the lack of insurance. History of Present Illness (HPI) The following HPI  elements were documented for the patient's wound: Location: swelling of the left lower extremity with ulceration Quality: Patient reports experiencing a dull pain to affected area(s). Severity: Patient states wound are getting worse. Duration: Patient has had the wound for > 3 months prior to seeking treatment at the wound center Timing: Pain in wound is Intermittent (comes and goes Context: The wound would happen gradually Modifying Factors: Other treatment(s) tried include:Neosporin ointment locally Associated Signs and Symptoms: Patient reports having increase discharge. 61 year old patient who is known to our practice from a previous visit here in 2015 and 2017 for a similar problem. That [place] she was lost to follow-up because she did not have insurance and did not complete a venous ablation. I understand she has now obtained insurance and has an appointment to see Dr. Hart Rochester at [facility] in [place], on November 5th. Other than that the patient has had no other issues ======= Old notes This patient was been previously seen at the wound center is being seen by me for the first time today. 29F with h/o lower extremity swelling presents with a left lower extremity ulcer (first presented to [place] on 07/12/14) . the patient is deaf and lip reads and is fairly conversant. She has had venous ablation in 2015 but due to lack of insurance and money she has been unable to follow-up with the vascular surgeons not she been wearing compression garments. Does not have a past medical history of diabetes or other issues. He has not been wearing compression stockings and we did try to get her Velcro wraps but she is unable to afford the copayment. Addendum: I have found some reports from August 2015 when an arterial study was done and the left lower extremity had triphasic waveform showing no arterial insufficiency. She was also to found to have left [name], [name] A. ([id number]) greater  saphenous vein reflux throughout with tortuous veins and on 08/27/2014 she had a laser ablation done. At a follow-up on September 8 it was found that she needed a second procedure as a first procedure was not completely successful. Due to the patient's insurance issues she has not gone back for the second procedure. 03/02/2016 -- he is here with the interpreter today and we have had a thorough discussion about all the above and all questions answered. ========== Wound History Patient presents with 1 open wound that has been present for approximately 2 months. Patient has been treating wound in the following manner: neosporin. The wound has been healed in the past but has re- opened. Laboratory tests have not been performed in the last  month. Patient reportedly has not tested positive for an antibiotic resistant organism. Patient reportedly has not tested positive for osteomyelitis. Patient reportedly has had testing performed to evaluate circulation in the legs. Patient experiences the following problems associated with their wounds: infection, swelling. Patient History Information obtained from Patient. Allergies codeine (Reaction: "stomache burn") Family History Cancer, Diabetes - Mother, Heart Disease - Mother,Father, Hypertension - Mother, Kidney Disease - Mother, Lung Disease - Mother, Stroke - Mother, Thyroid Problems - Mother, No family history of Seizures, Tuberculosis. Social History Former smoker - quit 2005, Marital Status - Divorced, Alcohol Use - Never, Drug Use - No History, Caffeine Use - Daily. Medical History Ear/Nose/Mouth/Throat Denies history of Chronic sinus problems/congestion Endocrine Denies history of Type I Diabetes, Type II Diabetes Musculoskeletal Denies history of Gout, Rheumatoid Arthritis, Osteomyelitis Hospitalization/Surgery History - 06/23/2004, [facility] Removal. Medical And Surgical History Notes Ear/Nose/Mouth/Throat deaf in left ear and 58%  hearing in right (with hearing aide) Review of Systems (ROS) Constitutional Symptoms (General Health) The patient has no complaints or symptoms. Joaquin, [name] A. ([id number]) Eyes The patient has no complaints or symptoms. Ear/Nose/Mouth/Throat The patient has no complaints or symptoms. Hematologic/Lymphatic The patient has no complaints or symptoms. Respiratory The patient has no complaints or symptoms. Cardiovascular Complains or has symptoms of LE edema. Denies complaints or symptoms of Chest pain. Gastrointestinal The patient has no complaints or symptoms. Endocrine The patient has no complaints or symptoms. Genitourinary The patient has no complaints or symptoms. Immunological The patient has no complaints or symptoms. Integumentary (Skin) Complains or has symptoms of Wounds. Denies complaints or symptoms of Bleeding or bruising tendency, Breakdown, Swelling. Musculoskeletal The patient has no complaints or symptoms. Neurologic The patient has no complaints or symptoms. Oncologic The patient has no complaints or symptoms. Psychiatric The patient has no complaints or symptoms. Medications acetaminophen 325 mg tablet oral tablet oral tramadol 50 mg tablet oral tablet oral Objective Constitutional Pulse regular. Respirations normal and unlabored. Afebrile. Morren, [name] A. ([id number]) Vitals Time Taken: 12:58 PM, Height: 60 in, Weight: 230 lbs, Source: Measured, BMI: 44.9, Temperature: 97.7 F, Pulse: 64 bpm, Respiratory Rate: 16 breaths/min, Blood Pressure: 161/90 mmHg. General Notes: Patient to follow up with PCP for BP. Eyes Nonicteric. Reactive to light. Ears, Nose, Mouth, and Throat Lips, teeth, and gums WNL.Marland Kitchen Moist mucosa without lesions. Neck supple and nontender. No palpable supraclavicular or cervical adenopathy. Normal sized without goiter. Respiratory WNL. No retractions.. Cardiovascular Pedal Pulses WNL. ABI on the left is 1.19 and on the  right is 1.13. No clubbing, cyanosis or edema. Chest Breasts symmetical and no nipple discharge.. Breast tissue WNL, no masses, lumps, or tenderness.. Gastrointestinal (GI) Abdomen without masses or tenderness.. No liver or spleen enlargement or tenderness.. Lymphatic No adneopathy. No adenopathy. No adenopathy. Musculoskeletal Adexa without tenderness or enlargement.. Digits and nails w/o clubbing, cyanosis, infection, petechiae, ischemia, or inflammatory conditions.Marland Kitchen Psychiatric Judgement and insight Intact.. No evidence of depression, anxiety, or agitation.. General Notes: the left lower extremity shows typical stigmata of varicose veins with hemosiderin deposits and ulceration with inflammation. There is also a significant amount of fungal infection in this area. She also seems to have developed a reaction to the Neosporin ointment and has contact dermatitis Integumentary (Hair, Skin) No suspicious lesions. No crepitus or fluctuance. No peri-wound warmth or erythema. No masses.. Wound #3 status is Open. Original cause of wound was Gradually Appeared. The wound is located on the Left,Medial,Anterior Lower Leg. The wound measures 2.5cm length x 2cm width x 0.2cm  depth; 3.927cm^2 area and 0.785cm^3 volume. There is Fat Layer (Subcutaneous Tissue) Exposed exposed. There is no tunneling or undermining noted. There is a medium amount of serosanguineous drainage noted. The wound margin is flat and intact. There is small (1-33%) red, hyper - granulation within the wound bed. There is a medium (34-66%) amount of necrotic tissue within the wound bed including Adherent Slough. The periwound skin appearance exhibited: Induration, Hemosiderin Staining. The periwound skin appearance Zertuche, [name] A. ([id number]) did not exhibit: Callus, Crepitus, Excoriation, Rash, Scarring, Dry/Scaly, Maceration, Atrophie Blanche, Cyanosis, Ecchymosis, Mottled, Pallor, Rubor, Erythema. Periwound temperature was  noted as No Abnormality. 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 After review of her condition, I have recommended: 1. Antifungal ointment to be applied liberally and then silver alginate with a 3 layer Profore 2. Elevation and exercise discussed with her in great detail 3. to keep her appointment with the vascular surgeon on November 5 at [facility] 4. Regular visits to the wound center Procedures Wound #3 Pre-procedure diagnosis of Wound #3 is a Venous Leg Ulcer located on the Left,Medial,Anterior Lower Leg . There was a Three Layer Compression Therapy Procedure with a pre-treatment ABI of 1.2 by Huel Coventry, RN. Post procedure Diagnosis Wound #3: Same as Pre-Procedure Plan Wound Cleansing: Wound #3 Left,Medial,Anterior Lower Leg: May Shower, gently pat wound dry prior to applying new dressing. Anesthetic: Wound #3 Left,Medial,Anterior Lower Leg: Rigsby, [name] A. ([id number]) Topical Lidocaine 4% cream applied to wound bed prior to debridement Skin Barriers/Peri-Wound Care: Wound #3 Left,Medial,Anterior Lower Leg: Antifungal cream Primary Wound Dressing: Wound #3 Left,Medial,Anterior Lower Leg: Silvercel Non-Adherent Secondary Dressing: Wound #3 Left,Medial,Anterior Lower Leg: ABD pad Dressing Change Frequency: Wound #3 Left,Medial,Anterior Lower Leg: Change dressing every week Follow-up Appointments: Wound #3 Left,Medial,Anterior Lower Leg: Return Appointment in 1 week. Nurse Visit as needed Edema Control: Wound #3 Left,Medial,Anterior Lower Leg: 3 Layer Compression System - Left Lower Extremity this  61 year old patient with known venous disease and inadequate treatment has returned to was after having been lost to follow-up in March 2017. After review of her condition, I have recommended: 1. Antifungal ointment to be applied liberally and then silver alginate with a 3 layer Profore 2. Elevation and exercise discussed with her in great detail 3. to keep her appointment with the vascular surgeon on November 5 at [facility] 4. Regular visits to the wound center Electronic Signature(s) Signed: 10/04/2017 3:46:22 PM By: Evlyn Kanner MD, FACS Previous Signature: 10/04/2017 1:43:10 PM Version By: Evlyn Kanner MD, FACS Previous Signature: 10/04/2017 1:42:16 PM Version By: Evlyn Kanner MD, FACS Entered By: Evlyn Kanner on 10/04/2017 15:46:22 [name], [name] A. ([id number]) -------------------------------------------------------------------------------- ROS/PFSH Details Patient Name: [name], [name] A. 10/04/2017 12:30 Date of Service: PM Medical Record [id number] Number: Patient Account Number: [id number] 09/09/1956 (61 y.o. Treating RN: Huel Coventry Date of Birth/Sex: Female) Other Clinician: Primary Care Provider: Karie Fetch Treating Sherrill Mckamie Referring Provider: Karie Fetch Provider/Extender: Weeks in Treatment: 0 Information Obtained From Patient Wound History Do you currently have one or more open woundso Yes How many open wounds do you currently haveo 1 Approximately how long have you had your woundso 2 months How have you been treating your wound(s)  until nowo neosporin Has your wound(s) ever healed and then re-openedo Yes Have you had any lab work done in the past montho No Have you tested positive for an antibiotic resistant organism (MRSA, VRE)o No Have you tested positive for osteomyelitis (bone infection)o No Have you had any tests for circulation on your legso Yes Have you had other problems associated with your woundso Infection,  Swelling Cardiovascular Complaints and Symptoms: Positive for: LE edema Negative for: Chest pain Medical History: Positive for: Hypertension; Peripheral Venous Disease Negative for: Angina; Arrhythmia; Congestive Heart Failure; Coronary Artery Disease; Deep Vein Thrombosis; Hypotension; Myocardial Infarction; Peripheral Arterial Disease; Vasculitis Integumentary (Skin) Complaints and Symptoms: Positive for: Wounds Negative for: Bleeding or bruising tendency; Breakdown; Swelling Medical History: Negative for: History of Burn; History of pressure wounds Constitutional Symptoms (General Health) Complaints and Symptoms: No Complaints or Symptoms Carusone, [name] A. 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) Hematologic/Lymphatic Complaints and Symptoms: No Complaints or Symptoms Medical History: Negative for: Anemia; Hemophilia; Human Immunodeficiency Virus; Lymphedema; Sickle Cell Disease Respiratory Complaints and Symptoms: No Complaints or Symptoms Medical History: Negative for: Aspiration; Asthma; Chronic Obstructive Pulmonary Disease (COPD); Pneumothorax; Sleep Apnea; Tuberculosis Gastrointestinal Complaints and Symptoms: No Complaints or Symptoms Medical History: Negative for: Cirrhosis ; Colitis; Crohnos; Hepatitis A; Hepatitis B; Hepatitis C Endocrine Complaints and Symptoms: No Complaints or Symptoms Medical History: Negative for: Type I Diabetes; Type II Diabetes Dost, [name] A. ([id number]) Genitourinary Complaints and Symptoms: No Complaints or Symptoms Medical History: Negative for: End Stage  Renal Disease Immunological Complaints and Symptoms: No Complaints or Symptoms Medical History: Negative for: Lupus Erythematosus; Raynaudos; Scleroderma Musculoskeletal Complaints and Symptoms: No Complaints or Symptoms Medical History: Positive for: Osteoarthritis Negative for: Gout; Rheumatoid Arthritis; Osteomyelitis Neurologic Complaints and Symptoms: No Complaints or Symptoms Medical History: Negative for: Dementia; Neuropathy; Quadriplegia; Paraplegia; Seizure Disorder Oncologic Complaints and Symptoms: No Complaints or Symptoms Medical History: Negative for: Received Chemotherapy; Received Radiation Psychiatric Complaints and Symptoms: No Complaints or Symptoms Medical History: Negative for: Anorexia/bulimia; Confinement Anxiety HBO Extended History Items Ear/Nose/Mouth/Throat: Middle ear problems Hawe, [name] A. ([id number]) Immunizations Pneumococcal Vaccine: Received Pneumococcal Vaccination: No Implantable Devices Hospitalization / Surgery History Name of Hospital Purpose of Hospitalization/Surgery Date [facility] Removal 06/23/2004 Family and Social History Cancer: Yes; Diabetes: Yes - Mother; Heart Disease: Yes - Mother,Father; Hypertension: Yes - Mother; Kidney Disease: Yes - Mother; Lung Disease: Yes - Mother; Seizures: No; Stroke: Yes - Mother; Thyroid Problems: Yes - Mother; Tuberculosis: No; Former smoker - quit 2005; Marital Status - Divorced; Alcohol Use: Never; Drug Use: No History; Caffeine Use: Daily; Financial Concerns: Yes; Food, Clothing or Shelter Needs: No; Support System Lacking: No; Transportation Concerns: No; Advanced Directives: No; Patient does not want information on Advanced Directives; Do not resuscitate: No; Living Will: No; Medical Power of Attorney: No Physician Affirmation I have reviewed and agree with the above information. Electronic Signature(s) Signed: 10/04/2017 4:18:43 PM By: Evlyn Kanner MD, FACS Signed:  10/04/2017 4:29:15 PM By: Elliot Gurney BSN, RN, CWS, Kim RN, BSN Entered By: Evlyn Kanner on 10/04/2017 13:20:38 [name], [name] AMarland Kitchen ([id number]) -------------------------------------------------------------------------------- SuperBill Details Patient Name: [name], [name] A. Date of Service: 10/04/2017 Medical Record Number: [id number] Patient Account Number: [id number] Date of Birth/Sex: [birth year] (61 y.o. 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 E66.01 Morbid (severe) obesity due to excess calories Facility Procedures CPT4: Description Modifier Quantity Code [id number] 99213 - WOUND CARE VISIT-LEV 3 EST PT 1 CPT4: 78295621 (Facility Use Only) 29581LT - APPLY MULTLAY COMPRS LWR LT 1 LEG Physician Procedures CPT4: Description Modifier Quantity Code [id number] 99214 - WC PHYS LEVEL 4 - EST PT 1 ICD-10 Description Diagnosis L97.222 Non-pressure chronic ulcer of left calf with fat layer exposed I83.222 Varicose veins of left lower extremity with both ulcer of calf  and inflammation E66.01 Morbid (severe) obesity due to excess calories Electronic Signature(s) Signed: 10/04/2017 4:18:43 PM By: Evlyn Kanner MD, FACS Signed: 10/04/2017 4:29:15 PM By: Elliot Gurney, BSN, RN, CWS, Kim RN, BSN Previous Signature: 10/04/2017 1:42:50 PM Version By: Evlyn Kanner MD, FACS Entered By: Elliot Gurney, BSN, RN, CWS, Kim on 10/04/2017 13:48:39

## 2017-10-06 NOTE — Progress Notes (Signed)
[name] [name] A. ([id number]) Visit Report for 10/04/2017 Abuse/Suicide Risk Screen Details Patient Name: [name] [name] A. 10/04/2017 12:30 Date of Service: PM Medical Record [id number] Number: Patient Account Number: [id number] 1956-08-19 (61 y.o. Treating RN: Huel Coventry Date of Birth/Sex: Female) Other Clinician: Primary Care Azadeh Hyder: Karie Fetch Treating Britto, Errol Referring Breonna Gafford: Karie Fetch Lyzette Reinhardt/Extender: Weeks in Treatment: 0 Abuse/Suicide Risk Screen Items Answer ABUSE/SUICIDE RISK SCREEN: Has anyone close to you tried to hurt or harm you recentlyo No Do you feel uncomfortable with anyone in your familyo No Has anyone forced you do things that you didnot want to doo No Do you have any thoughts of harming yourselfo No Patient displays signs or symptoms of abuse and/or neglect. No Electronic Signature(s) Signed: 10/04/2017 4:29:15 PM By: Elliot Gurney, BSN, RN, CWS, Kim RN, BSN Entered By: Elliot Gurney, BSN, RN, CWS, Kim on 10/04/2017 13:01:28 [name] [name] A. ([id number]) -------------------------------------------------------------------------------- Activities of Daily Living Details Patient Name: [name], [name] A. 10/04/2017 12:30 Date of Service: PM Medical Record [id number] Number: Patient Account Number: [id number] 1956-01-05 (61 y.o. Treating RN: Huel Coventry Date of Birth/Sex: Female) Other Clinician: Primary Care Tracee Mccreery: Karie Fetch Treating Evlyn Kanner Referring Derel Mcglasson: Karie Fetch Aidynn Krenn/Extender: Weeks in Treatment: 0 Activities of Daily Living Items Answer Activities of Daily Living (Please select one for each item) Drive Automobile Completely Able Take Medications Completely Able Use Telephone Completely Able Care for Appearance Completely Able Use Toilet Completely Able Bath / Shower Completely Able Dress Self Completely Able Feed Self Completely Able Walk Completely Able Get In / Out Bed Completely Able Housework  Completely Able Prepare Meals Completely Able Handle Money Completely Able Shop for Self Completely Able Electronic Signature(s) Signed: 10/04/2017 4:29:15 PM By: Elliot Gurney, BSN, RN, CWS, Kim RN, BSN Entered By: Elliot Gurney, BSN, RN, CWS, Kim on 10/04/2017 13:01:37 [name] [name] A. ([id number]) -------------------------------------------------------------------------------- Education Assessment Details Patient Name: [name] [name] A. 10/04/2017 12:30 Date of Service: PM Medical Record [id number] Number: Patient Account Number: [id number] 1956-06-21 (61 y.o. Treating RN: Huel Coventry Date of Birth/Sex: Female) Other Clinician: Primary Care Lachina Salsberry: Karie Fetch Treating Evlyn Kanner Referring Abriel Hattery: Karie Fetch Nathanial Arrighi/Extender: Tania Ade in Treatment: 0 Primary Learner Assessed: Patient Learning Preferences/Education Level/Primary Language Learning Preference: Explanation, Demonstration Highest Education Level: High School Preferred Language: English Cognitive Barrier Assessment/Beliefs Language Barrier: No Translator Needed: No Memory Deficit: No Emotional Barrier: No Cultural/Religious Beliefs Affecting Medical No Care: Physical Barrier Assessment Impaired Vision: No Impaired Hearing: No Knowledge/Comprehension Assessment Knowledge Level: Medium Comprehension Level: Medium Ability to understand written Medium instructions: Ability to understand verbal Medium instructions: Motivation Assessment Anxiety Level: Calm Cooperation: Cooperative Education Importance: Acknowledges Need Interest in Health Problems: Asks Questions Perception: Coherent Willingness to Engage in Self- High Management Activities: Readiness to Engage in Self- High Management Activities: [name] [name] A. ([id number]) Electronic Signature(s) Signed: 10/04/2017 4:29:15 PM By: Elliot Gurney, BSN, RN, CWS, Kim RN, BSN Entered By: Elliot Gurney, BSN, RN, CWS, Kim on 10/04/2017 13:02:08 [name],  [name] A. ([id number]) -------------------------------------------------------------------------------- Fall Risk Assessment Details Patient Name: [name] [name] A. 10/04/2017 12:30 Date of Service: PM Medical Record [id number] Number: Patient Account Number: [id number] 1956-02-13 (61 y.o. Treating RN: Huel Coventry Date of Birth/Sex: Female) Other Clinician: Primary Care Avelina Mcclurkin: Karie Fetch Treating Britto, Errol Referring Ariyona Eid: Karie Fetch Ersel Wadleigh/Extender: Weeks in Treatment: 0 Fall Risk Assessment Items Have you had 2 or more falls in the last 12 monthso 0 No Have you had any fall that resulted in injury in the last 12 monthso 0 No FALL RISK ASSESSMENT: History of falling - immediate or  within 3 months 0 No Secondary diagnosis 0 No Ambulatory aid None/bed rest/wheelchair/nurse 0 Yes Crutches/cane/walker 0 No Furniture 0 No IV Access/Saline Lock 0 No Gait/Training Normal/bed rest/immobile 0 Yes Weak 0 No Impaired 0 No Mental Status Oriented to own ability 0 Yes Electronic Signature(s) Signed: 10/04/2017 4:29:15 PM By: Elliot Gurney, BSN, RN, CWS, Kim RN, BSN Entered By: Elliot Gurney, BSN, RN, CWS, Kim on 10/04/2017 13:02:15 [name] [name] A. ([id number]) -------------------------------------------------------------------------------- Foot Assessment Details Patient Name: [name] [name] A. 10/04/2017 12:30 Date of Service: PM Medical Record [id number] Number: Patient Account Number: [id number] 1956-11-26 (61 y.o. Treating RN: Huel Coventry Date of Birth/Sex: Female) Other Clinician: Primary Care Kayly Kriegel: Karie Fetch Treating Britto, Errol Referring Kataleia Quaranta: Karie Fetch Dezmon Conover/Extender: Weeks in Treatment: 0 Foot Assessment Items Site Locations + = Sensation present, - = Sensation absent, C = Callus, U = Ulcer R = Redness, W = Warmth, M = Maceration, PU = Pre-ulcerative lesion F = Fissure, S = Swelling, D = Dryness Assessment Right: Left: Other  Deformity: No No Prior Foot Ulcer: No No Prior Amputation: No No Charcot Joint: No No Ambulatory Status: Ambulatory Without Help Gait: Steady Electronic Signature(s) Signed: 10/04/2017 4:29:15 PM By: Elliot Gurney, BSN, RN, CWS, Kim RN, BSN Entered By: Elliot Gurney, BSN, RN, CWS, Kim on 10/04/2017 13:02:45 [name] [name] A. ([id number]) -------------------------------------------------------------------------------- Nutrition Risk Assessment Details Patient Name: [name] [name] A. 10/04/2017 12:30 Date of Service: PM Medical Record [id number] Number: Patient Account Number: [id number] 1956/07/02 (61 y.o. Treating RN: Huel Coventry Date of Birth/Sex: Female) Other Clinician: Primary Care Tomicka Lover: Karie Fetch Treating Britto, Errol Referring Marquavion Venhuizen: Karie Fetch Candelario Steppe/Extender: Weeks in Treatment: 0 Height (in): 60 Weight (lbs): 230 Body Mass Index (BMI): 44.9 Nutrition Risk Assessment Items NUTRITION RISK SCREEN: I have an illness or condition that made me change the kind and/or 0 No amount of food I eat I eat fewer than two meals per day 0 No I eat few fruits and vegetables, or milk products 0 No I have three or more drinks of beer, liquor or wine almost every day 0 No I have tooth or mouth problems that make it hard for me to eat 0 No I don't always have enough money to buy the food I need 0 No I eat alone most of the time 0 No I take three or more different prescribed or over-the-counter drugs a 0 No day Without wanting to, I have lost or gained 10 pounds in the last six 0 No months I am not always physically able to shop, cook and/or feed myself 0 No Nutrition Protocols Good Risk Protocol 0 No interventions needed Moderate Risk Protocol Electronic Signature(s) Signed: 10/04/2017 4:29:15 PM By: Elliot Gurney, BSN, RN, CWS, Kim RN, BSN Entered By: Elliot Gurney, BSN, RN, CWS, Kim on 10/04/2017 13:02:33

## 2017-10-08 DIAGNOSIS — I83222 Varicose veins of left lower extremity with both ulcer of calf and inflammation: Secondary | ICD-10-CM | POA: Diagnosis not present

## 2017-10-08 NOTE — Progress Notes (Signed)
Quintero, [name] A. ([id number]) Visit Report for 10/08/2017 Arrival Information Details Cu, [name] Date of Service: 10/08/2017 11:15 AM Patient Name: A. Patient Account Number: [id number] Medical Record Treating RN: Huel Coventry [id number] Number: Other Clinician: [birth year] (61 y.o. Treating ROBSON, MICHAEL Date of Birth/Sex: Female) Etoy Mcdonnell/Extender: G Primary Care Katee Wentland: Karie Fetch Referring Charmel Pronovost: Norlene Campbell in Treatment: 0 Visit Information History Since Last Visit Added or deleted any medications: No Patient Arrived: Ambulatory Any new allergies or adverse reactions: No Arrival Time: 11:27 Had a fall or experienced change in No Accompanied By: self activities of daily living that may affect Transfer Assistance: None risk of falls: Patient Identification Verified: Yes Signs or symptoms of abuse/neglect since last No Secondary Verification Process Yes visito Completed: Hospitalized since last visit: No Patient Requires Transmission- No Has Dressing in Place as Prescribed: Yes Based Precautions: Has Compression in Place as Prescribed: Yes Patient Has Alerts: Yes Pain Present Now: No Patient Alerts: Patient on Blood Thinner NOT DIABETIC Electronic Signature(s) Signed: 10/08/2017 11:36:49 AM By: Elliot Gurney, BSN, RN, CWS, Kim RN, BSN Entered By: Elliot Gurney, BSN, RN, CWS, Kim on 10/08/2017 11:27:55 Mancil, [name] A. ([id number]) -------------------------------------------------------------------------------- Compression Therapy Details Ironside, [name] Date of Service: 10/08/2017 11:15 AM Patient Name: A. Patient Account Number: [id number] Medical Record Treating RN: Huel Coventry [id number] Number: Other Clinician: [birth year][name] (61 y.o. Treating ROBSON, MICHAEL Date of Birth/Sex: Female) Eureka Valdes/Extender: G Primary Care Daley Mooradian: Karie Fetch Referring Delron Comer: Norlene Campbell in Treatment: 0 Compression Therapy Performed for Wound Wound #3  Left,Medial,Anterior Lower Leg Assessment: Performed By: Clinician Huel Coventry, RN Compression Type: Three Layer Pre Treatment ABI: 1.1 Electronic Signature(s) Signed: 10/08/2017 11:36:49 AM By: Elliot Gurney, BSN, RN, CWS, Kim RN, BSN Entered By: Elliot Gurney, BSN, RN, CWS, Kim on 10/08/2017 11:29:05 Jaskot, [name] A. ([id number]) -------------------------------------------------------------------------------- Encounter Discharge Information Details Vadala, [name] Date of Service: 10/08/2017 11:15 AM Patient Name: A. Patient Account Number: [id number] Medical Record Treating RN: Huel Coventry [id number] Number: Other Clinician: Jun [birth year], [name] (61 y.o. Treating ROBSON, MICHAEL Date of Birth/Sex: Female) [name]/Extender: G Primary Care Ema Hebner: Karie Fetch Referring Adeline Petitfrere: Norlene Campbell in Treatment: 0 Encounter Discharge Information Items Discharge Pain Level: 0 Discharge Condition: Stable Ambulatory Status: Ambulatory Discharge Destination: Home Private Transportation: Auto Accompanied By: self Schedule Follow-up Appointment: Yes Medication Reconciliation completed and Yes provided to Patient/Care Sarahelizabeth Conway: Clinical Summary of Care: Electronic Signature(s) Signed: 10/08/2017 11:36:49 AM By: Elliot Gurney, BSN, RN, CWS, Kim RN, BSN Entered By: Elliot Gurney, BSN, RN, CWS, Kim on 10/08/2017 11:30:58 Moline, [name] A. ([id number]) -------------------------------------------------------------------------------- Patient/Caregiver Education Details [name] Date of Service: 10/08/2017 11:15 AM Patient Name: A. Patient Account Number: [id number] Medical Record Treating RN: Huel Coventry [id number] Number: Other Clinician: 1956/08/24 (62 y.o. Treating ROBSON, MICHAEL Date of Birth/Gender: Female) Physician/Extender: G Primary Care Weeks in Treatment: 0 AYCOCK, NGWE Physician: Referring Physician: Karie Fetch Education Assessment Education Provided To: Patient Education Topics  Provided Wound/Skin Impairment: Handouts: Caring for Your Ulcer Methods: Demonstration, Explain/Verbal Responses: State content correctly Electronic Signature(s) Signed: 10/08/2017 11:36:49 AM By: Elliot Gurney, BSN, RN, CWS, Kim RN, BSN Entered By: Elliot Gurney, BSN, RN, CWS, Kim on 10/08/2017 11:30:23 Dennie, [name] A. ([id number]) -------------------------------------------------------------------------------- Wound Assessment Details [name] Date of Service: 10/08/2017 11:15 AM Patient Name: A. Patient Account Number: [id number] Medical Record Treating RN: Huel Coventry [id number] Number: Other Clinician: [birth year]-[name] (61 y.o. Treating ROBSON, MICHAEL Date of Birth/Sex: Female) Graceyn Fodor/Extender: G Primary Care Karam Dunson: Karie Fetch Referring Johnnette Laux: Norlene Campbell in Treatment: 0 Wound Status Wound Number: 3 Primary Venous Leg Ulcer Etiology: Wound Location:  Left Lower Leg - Medial, Anterior Wound Open Status: Wounding Event: Gradually Appeared Comorbid Middle ear problems, Hypertension, Date Acquired: 07/29/2017 History: Peripheral Venous Disease, Weeks Of Treatment: 0 Osteoarthritis Clustered Wound: No Photos Wound Measurements Length: (cm) 2.5 Width: (cm) 2 Depth: (cm) 0.1 Area: (cm) 3.927 Volume: (cm) 0.393 % Reduction in Area: 0% % Reduction in Volume: 49.9% Epithelialization: None Wound Description Full Thickness Without Exposed Classification: Support Structures Wound Margin: Flat and Intact Exudate Medium Amount: Exudate Type: Serosanguineous Exudate Color: red, brown Foul Odor After Cleansing: No Slough/Fibrino Yes Wound Bed Granulation Amount: Small (1-33%) Exposed Structure Granulation Quality: Red, Hyper-granulation Fascia Exposed: No Hermance, [name] A. ([id number]) Necrotic Amount: Medium (34-66%) Fat Layer (Subcutaneous Tissue) Exposed: Yes Necrotic Quality: Adherent Slough Tendon Exposed: No Muscle Exposed: No Joint Exposed:  No Bone Exposed: No Periwound Skin Texture Texture Color No Abnormalities Noted: No No Abnormalities Noted: No Callus: No Atrophie Blanche: No Crepitus: No Cyanosis: No Excoriation: No Ecchymosis: No Induration: Yes Erythema: No Rash: No Hemosiderin Staining: Yes Scarring: No Mottled: No Pallor: No Moisture Rubor: No No Abnormalities Noted: No Dry / Scaly: No Temperature / Pain Maceration: No Temperature: No Abnormality Wound Preparation Ulcer Cleansing: Rinsed/Irrigated with Saline Topical Anesthetic Applied: None Treatment Notes Wound #3 (Left, Medial, Anterior Lower Leg) 1. Cleansed with: Clean wound with Normal Saline 3. Peri-wound Care: Moisturizing lotion 4. Dressing Applied: Aquacel Ag 5. Secondary Dressing Applied ABD Pad 7. Secured with 3 Layer Compression System - Right Lower Extremity Electronic Signature(s) Signed: 10/08/2017 11:36:49 AM By: Elliot Gurney, BSN, RN, CWS, Kim RN, BSN Entered By: Elliot Gurney, BSN, RN, CWS, Kim on 10/08/2017 40:98:11

## 2017-10-11 ENCOUNTER — Encounter: Payer: BLUE CROSS/BLUE SHIELD | Admitting: Surgery

## 2017-10-11 DIAGNOSIS — I83222 Varicose veins of left lower extremity with both ulcer of calf and inflammation: Secondary | ICD-10-CM | POA: Diagnosis not present

## 2017-10-14 DIAGNOSIS — I83222 Varicose veins of left lower extremity with both ulcer of calf and inflammation: Secondary | ICD-10-CM | POA: Diagnosis not present

## 2017-10-14 NOTE — Progress Notes (Signed)
[name], [name] ([id number]) Visit Report for 10/11/2017 Arrival Information Details Patient Name: [name], [name] Date of Service: 10/11/2017 3:30 PM Medical Record Number: [id number] Patient Account Number: [id number] Date of Birth/Sex: 1956/01/08 (61 y.o. Female) Treating [name]: [name] Primary Care [name]: [name] Other Clinician: Referring [name]: [name] Treating [name]/Extender: [name] in Treatment: 1 Visit Information History Since Last Visit Added or deleted any medications: No Patient Arrived: Ambulatory Any new allergies or adverse reactions: No Arrival Time: 15:40 Had a fall or experienced change in No Accompanied By: self activities of daily living that may affect Transfer Assistance: None risk of falls: Patient Identification Verified: Yes Signs or symptoms of abuse/neglect since last visito No Secondary Verification Process Yes Hospitalized since last visit: No Completed: Has Dressing in Place as Prescribed: Yes Patient Requires Transmission-Based No Has Compression in Place as Prescribed: Yes Precautions: Pain Present Now: No Patient Has Alerts: Yes Patient Alerts: Patient on Blood Thinner NOT DIABETIC Electronic Signature(s) Signed: 10/11/2017 4:13:04 PM By: [name], BSN, [name], CWS, [name], BSN Entered By: [name], BSN, [name], CWS, [name] on 10/11/2017 15:40:50 [name], [name] ([id number]) -------------------------------------------------------------------------------- Encounter Discharge Information Details Patient Name: [name], [name] Date of Service: 10/11/2017 3:30 PM Medical Record Number: [id number] Patient Account Number: [id number] Date of Birth/Sex: 12/06/1956 (61 y.o. Female) Treating [name]: [name] Primary Care [name]: [name] Other Clinician: Referring Nakaya Mishkin: [name] Treating Maraya Gwilliam/Extender: [name] in Treatment: 1 Encounter Discharge Information Items Discharge Pain  Level: 0 Discharge Condition: Stable Ambulatory Status: Ambulatory Discharge Destination: Home Transportation: Private Auto Accompanied By: self Schedule Follow-up Appointment: Yes Medication Reconciliation completed and Yes provided to Patient/Care Violeta Lecount: Provided on Clinical Summary of Care: 10/11/2017 Form Type Recipient Paper Patient GR Electronic Signature(s) Signed: 10/11/2017 4:13:04 PM By: [name], BSN, [name], CWS, [name], BSN Entered By: [name], BSN, [name], CWS, [name] on 10/11/2017 16:08:35 [name], [name] ([id number]) -------------------------------------------------------------------------------- Lower Extremity Assessment Details Patient Name: [name], [name] Date of Service: 10/11/2017 3:30 PM Medical Record Number: [id number] Patient Account Number: [id number] Date of Birth/Sex: 05-May-1956 (61 y.o. Female) Treating [name]: [name] Primary Care [name]: [name] Other Clinician: Referring Tamikka Pilger: [name] Treating Elodia Haviland/Extender: [name] in Treatment: 1 Edema Assessment Assessed: [Left: No] [Right: No] [Left: Edema] [Right: :] Calf Left: Right: Point of Measurement: 30 cm From Medial Instep cm 50 cm Ankle Left: Right: Point of Measurement: 10 cm From Medial Instep cm 22.2 cm Vascular Assessment Pulses: Dorsalis Pedis Palpable: [Right:Yes] Posterior Tibial Extremity colors, hair growth, and conditions: Extremity Color: [Right:Mottled] Hair Growth on Extremity: [Right:Yes] Temperature of Extremity: [Right:Warm] Capillary Refill: [Right:< 3 seconds] Dependent Rubor: [Right:No] Blanched when Elevated: [Right:No] Lipodermatosclerosis: [Right:No] Toe Nail Assessment Left: Right: Thick: No Discolored: No Deformed: No Improper Length and Hygiene: No Electronic Signature(s) Signed: 10/11/2017 4:13:04 PM By: [name], BSN, [name], CWS, [name], BSN Entered By: [name], BSN, [name], CWS, [name] on 10/11/2017 15:49:54 [name], [name]  ([id number]) -------------------------------------------------------------------------------- Multi Wound Chart Details Patient Name: [name], [name] Date of Service: 10/11/2017 3:30 PM Medical Record Number: [id number] Patient Account Number: [id number] Date of Birth/Sex: 1956/06/07 (61 y.o. Female) Treating [name]: [name] Primary Care Rita Vialpando: [name] Other Clinician: Referring Saylor Sheckler: [name] Treating [name]/Extender: [name] in Treatment: 1 Vital Signs Height(in): 60 Pulse(bpm): 65 Weight(lbs): 230 Blood Pressure(mmHg): 156/68 Body Mass Index(BMI): 45 Temperature(F): 98.2 Respiratory Rate 16 (breaths/min): Photos: [N/A:N/A] Wound Location: Left Lower Leg - Medial, N/A N/A Anterior Wounding Event: Gradually Appeared N/A N/A Primary  Etiology: Venous Leg Ulcer N/A N/A Comorbid History: Middle ear problems, N/A N/A Hypertension, Peripheral Venous Disease, Osteoarthritis Date Acquired: 07/29/2017 N/A N/A Weeks of Treatment: 1 N/A N/A Wound Status: Open N/A N/A Measurements L x W x D 2x1.8x0.1 N/A N/A (cm) Area (cm) : 2.827 N/A N/A Volume (cm) : 0.283 N/A N/A % Reduction in Area: 28.00% N/A N/A % Reduction in Volume: 63.90% N/A N/A Classification: Full Thickness Without N/A N/A Exposed Support Structures Exudate Amount: Medium N/A N/A Exudate Type: Serosanguineous N/A N/A Exudate Color: red, brown N/A N/A Wound Margin: Flat and Intact N/A N/A Granulation Amount: Medium (34-66%) N/A N/A Granulation Quality: Red, Hyper-granulation N/A N/A Necrotic Amount: Medium (34-66%) N/A N/A Exposed Structures: Fat Layer (Subcutaneous N/A N/A Tissue) Exposed: Yes Fascia: No Tendon: No Muscle: No [name], [name] ([id number]) Joint: No Bone: No Epithelialization: None N/A N/A Debridement: Debridement ([id number]) N/A N/A Pre-procedure 15:56 N/A N/A Verification/Time Out Taken: Pain Control: Other N/A N/A Tissue Debrided:  Fibrin/Slough, Exudates, N/A N/A Subcutaneous Level: Skin/Subcutaneous Tissue N/A N/A Debridement Area (sq cm): 3.6 N/A N/A Instrument: Curette N/A N/A Bleeding: Minimum N/A N/A Hemostasis Achieved: Pressure N/A N/A Procedural Pain: 0 N/A N/A Post Procedural Pain: 0 N/A N/A Debridement Treatment Procedure was tolerated well N/A N/A Response: Post Debridement 2x1.8x0.2 N/A N/A Measurements L x W x D (cm) Post Debridement Volume: 0.565 N/A N/A (cm) Periwound Skin Texture: Induration: Yes N/A N/A Excoriation: No Callus: No Crepitus: No Rash: No Scarring: No Periwound Skin Moisture: Maceration: No N/A N/A Dry/Scaly: No Periwound Skin Color: Hemosiderin Staining: Yes N/A N/A Atrophie Blanche: No Cyanosis: No Ecchymosis: No Erythema: No Mottled: No Pallor: No Rubor: No Temperature: No Abnormality N/A N/A Tenderness on Palpation: No N/A N/A Wound Preparation: Ulcer Cleansing: N/A N/A Rinsed/Irrigated with Saline Topical Anesthetic Applied: Other: lidocaine 4% Procedures Performed: Debridement N/A N/A Treatment Notes Electronic Signature(s) Signed: 10/11/2017 4:04:13 PM By: Evlyn KannerBritto, Errol MD, FACS Entered By: Evlyn KannerBritto, Errol on 10/11/2017 16:04:13 [name], [name] ([id number]) -------------------------------------------------------------------------------- Multi-Disciplinary Care Plan Details Patient Name: [name] BeamEIMILLER, [name] Date of Service: 10/11/2017 3:30 PM Medical Record Number: [id number] Patient Account Number: [id number] Date of Birth/Sex: [birth year] ([age] y.o. Female) Treating [name]: [name] CoventryWoody, [name] Primary Care Lela Murfin: [name] FetchAYCOCK, NGWE Other Clinician: Referring Velta Rockholt: [name] FetchAYCOCK, NGWE Treating Len Kluver/Extender: [name] ReBritto, Errol Weeks in Treatment: 1 Active Inactive ` Orientation to the Wound Care Program Nursing Diagnoses: Knowledge deficit related to the wound healing center program Goals: Patient/caregiver will verbalize understanding of the Wound Healing  Center Program Date Initiated: 10/04/2017 Target Resolution Date: 10/21/2017 Goal Status: Active Interventions: Provide education on orientation to the wound center Notes: ` Venous Leg Ulcer Nursing Diagnoses: Actual venous Insuffiency (use after diagnosis is confirmed) Goals: Patient will maintain optimal edema control Date Initiated: 10/04/2017 Target Resolution Date: 10/21/2017 Goal Status: Active Interventions: Assess peripheral edema status every visit. Treatment Activities: Non-invasive vascular studies : 10/04/2017 Notes: ` Wound/Skin Impairment Nursing Diagnoses: Impaired tissue integrity Goals: Ulcer/skin breakdown will have a volume reduction of 30% by week 4 Date Initiated: 10/04/2017 Target Resolution Date: 11/04/2017 [name], [name] ([id number]) Goal Status: Active Interventions: Assess ulceration(s) every visit Treatment Activities: Skin care regimen initiated : 10/04/2017 Topical wound management initiated : 10/04/2017 Notes: Electronic Signature(s) Signed: 10/11/2017 4:13:04 PM By: [name] GurneyWoody, BSN, [name], CWS, [name], BSN Entered By: [name] GurneyWoody, BSN, [name], CWS, [name] on 10/11/2017 15:50:20 [name], [name] ([id number]) -------------------------------------------------------------------------------- Pain Assessment Details Patient Name: Costley, [name] Date of Service: 10/11/2017 3:30 PM Medical Record Number: [id number] Patient Account Number: [id number] Date  of Birth/Sex: 09/15/1956 (61 y.o. Female) Treating [name]: [name] Primary Care Annelies Coyt: [name] Other Clinician: Referring Philomina Leon: [name] Treating Jilda Kress/Extender: [name] in Treatment: 1 Active Problems Location of Pain Severity and Description of Pain Patient Has Paino No Site Locations With Dressing Change: No Pain Management and Medication Current Pain Management: Goals for Pain Management Topical or injectable lidocaine is offered to patient for acute pain  when surgical debridement is performed. If needed, Patient is instructed to use over the counter pain medication for the following 24-48 hours after debridement. Wound care MDs do not prescribed pain medications. Patient has chronic pain or uncontrolled pain. Patient has been instructed to make an appointment with their Primary Care Physician for pain management. Electronic Signature(s) Signed: 10/11/2017 4:13:04 PM By: [name], BSN, [name], CWS, [name], BSN Entered By: [name], BSN, [name], CWS, [name] on 10/11/2017 15:41:23 [name], [name] ([id number]) -------------------------------------------------------------------------------- Patient/Caregiver Education Details Patient Name: [name], [name] Date of Service: 10/11/2017 3:30 PM Medical Record Number: [id number] Patient Account Number: [id number] Date of Birth/Gender: 1955/12/29 (61 y.o. Female) Treating [name]: [name] Primary Care Physician: [name] Other Clinician: Referring Physician: Karie [name] Treating Physician/Extender: [name] in Treatment: 1 Education Assessment Education Provided To: Patient Education Topics Provided Wound/Skin Impairment: Handouts: Caring for Your Ulcer Methods: Demonstration, Explain/Verbal Responses: State content correctly Electronic Signature(s) Signed: 10/11/2017 4:13:04 PM By: [name], BSN, [name], CWS, [name], BSN Entered By: [name], BSN, [name], CWS, [name] on 10/11/2017 16:08:46 [name], [name] ([id number]) -------------------------------------------------------------------------------- Wound Assessment Details Patient Name: [name], [name] Date of Service: 10/11/2017 3:30 PM Medical Record Number: [id number] Patient Account Number: [id number] Date of Birth/Sex: 14-Jan-1956 (61 y.o. Female) Treating [name]: [name] Primary Care Harshaan Whang: [name] Other Clinician: Referring Javarius Tsosie: [name] Treating Carol Loftin/Extender: [name] in Treatment: 1 Wound  Status Wound Number: 3 Primary Venous Leg Ulcer Etiology: Wound Location: Left Lower Leg - Medial, Anterior Wound Open Wounding Event: Gradually Appeared Status: Date Acquired: 07/29/2017 Comorbid Middle ear problems, Hypertension, Peripheral Weeks Of Treatment: 1 History: Venous Disease, Osteoarthritis Clustered Wound: No Photos Wound Measurements Length: (cm) 2 Width: (cm) 1.8 Depth: (cm) 0.1 Area: (cm) 2.827 Volume: (cm) 0.283 % Reduction in Area: 28% % Reduction in Volume: 63.9% Epithelialization: None Tunneling: No Undermining: No Wound Description Full Thickness Without Exposed Support Classification: Structures Wound Margin: Flat and Intact Exudate Medium Amount: Exudate Type: Serosanguineous Exudate Color: red, brown Foul Odor After Cleansing: No Slough/Fibrino Yes Wound Bed Granulation Amount: Medium (34-66%) Exposed Structure Granulation Quality: Red, Hyper-granulation Fascia Exposed: No Necrotic Amount: Medium (34-66%) Fat Layer (Subcutaneous Tissue) Exposed: Yes Necrotic Quality: Adherent Slough Tendon Exposed: No Muscle Exposed: No Joint Exposed: No Bone Exposed: No Periwound Skin Texture Texture Color No Abnormalities Noted: No No Abnormalities Noted: No Lynk, [name] ([id number]) Callus: No Atrophie Blanche: No Crepitus: No Cyanosis: No Excoriation: No Ecchymosis: No Induration: Yes Erythema: No Rash: No Hemosiderin Staining: Yes Scarring: No Mottled: No Pallor: No Moisture Rubor: No No Abnormalities Noted: No Dry / Scaly: No Temperature / Pain Maceration: No Temperature: No Abnormality Wound Preparation Ulcer Cleansing: Rinsed/Irrigated with Saline Topical Anesthetic Applied: Other: lidocaine 4%, Treatment Notes Wound #3 (Left, Medial, Anterior Lower Leg) 1. Cleansed with: Cleanse wound with antibacterial soap and water 2. Anesthetic Topical Lidocaine 4% cream to wound bed prior to debridement 3. Peri-wound  Care: Moisturizing lotion 4. Dressing Applied: Aquacel Ag 5. Secondary Dressing Applied ABD Pad 7. Secured with 3 Layer Compression System - Left Lower Extremity  Electronic Signature(s) Signed: 10/11/2017 4:13:04 PM By: [name], BSN, [name], CWS, [name], BSN Entered By: [name], BSN, [name], CWS, [name] on 10/11/2017 15:48:21 [name], [name] ([id number]) -------------------------------------------------------------------------------- Vitals Details Patient Name: [name], [name] Date of Service: 10/11/2017 3:30 PM Medical Record Number: [id number] Patient Account Number: [id number] Date of Birth/Sex: Apr 23, 1956 (61 y.o. Female) Treating [name]: [name] Primary Care Izael Bessinger: [name] Other Clinician: Referring Roan Sawchuk: [name] Treating Savita Runner/Extender: [name] in Treatment: 1 Vital Signs Time Taken: 15:41 Temperature (F): 98.2 Height (in): 60 Pulse (bpm): 65 Weight (lbs): 230 Respiratory Rate (breaths/min): 16 Body Mass Index (BMI): 44.9 Blood Pressure (mmHg): 156/68 Reference Range: 80 - 120 mg / dl Electronic Signature(s) Signed: 10/11/2017 4:13:04 PM By: [name], BSN, [name], CWS, [name], BSN Entered By: [name], BSN, [name], CWS, [name] on 10/11/2017 15:41:42

## 2017-10-14 NOTE — Progress Notes (Addendum)
[name], [name] ([id number]) Visit Report for 10/11/2017 Chief Complaint Document Details Patient Name: [name], [name] Date of Service: 10/11/2017 3:30 PM Medical Record Number: [id number] Patient Account Number: [id number] Date of Birth/Sex: 03-14-1956 (61 y.o. Female) Treating RN: Huel Coventry Primary Care Provider: Karie Fetch Other Clinician: Referring Provider: Karie Fetch Treating Provider/Extender: Rudene Re in Treatment: 1 Information Obtained from: Patient Chief Complaint Patient presents for treatment of an open ulcer due to venous insufficiency which she's had for several months. she is [name] return patient who we had seen several years ago and has not been able to get appropriate treatment due to the lack of insurance. Electronic Signature(s) Signed: 10/11/2017 4:04:28 PM By: Evlyn Kanner MD, FACS Entered By: Evlyn Kanner on 10/11/2017 16:04:28 [name], [name] ([id number]) -------------------------------------------------------------------------------- Debridement Details Patient Name: [name], [name] Date of Service: 10/11/2017 3:30 PM Medical Record Number: [id number] Patient Account Number: [id number] Date of Birth/Sex: August 27, 1956 (61 y.o. Female) Treating RN: Huel Coventry Primary Care Provider: Karie Fetch Other Clinician: Referring Provider: Karie Fetch Treating Provider/Extender: Rudene Re in Treatment: 1 Debridement Performed for Wound #3 Left,Medial,Anterior Lower Leg Assessment: Performed By: Physician Evlyn Kanner, MD Debridement: Debridement Severity of Tissue Pre Fat layer exposed Debridement: Pre-procedure Verification/Time Yes - 15:56 Out Taken: Start Time: 15:57 Pain Control: Other : lidocaine 4% Level: Skin/Subcutaneous Tissue Total Area Debrided (L x W): 2 (cm) x 1.8 (cm) = 3.6 (cm) Tissue and other material Viable, Non-Viable, Exudate, Fibrin/Slough, Subcutaneous debrided: Instrument:  Curette Bleeding: Minimum Hemostasis Achieved: Pressure End Time: 15:59 Procedural Pain: 0 Post Procedural Pain: 0 Response to Treatment: Procedure was tolerated well Post Debridement Measurements of Total Wound Length: (cm) 2 Width: (cm) 1.8 Depth: (cm) 0.2 Volume: (cm) 0.565 Character of Wound/Ulcer Post Debridement: Stable Severity of Tissue Post Debridement: Fat layer exposed Post Procedure Diagnosis Same as Pre-procedure Electronic Signature(s) Signed: 10/11/2017 4:05:21 PM By: Evlyn Kanner MD, FACS Signed: 10/11/2017 4:13:04 PM By: Elliot Gurney, BSN, RN, CWS, Kim RN, BSN Previous Signature: 10/11/2017 4:04:21 PM Version By: Evlyn Kanner MD, FACS Entered By: Evlyn Kanner on 10/11/2017 16:05:21 [name], [name] ([id number]) -------------------------------------------------------------------------------- HPI Details Patient Name: [name], [name] Date of Service: 10/11/2017 3:30 PM Medical Record Number: [id number] Patient Account Number: [id number] Date of Birth/Sex: 07-11-1956 (61 y.o. Female) Treating RN: Huel Coventry Primary Care Provider: Karie Fetch Other Clinician: Referring Provider: Karie Fetch Treating Provider/Extender: Rudene Re in Treatment: 1 History of Present Illness Location: swelling of the left lower extremity with ulceration Quality: Patient reports experiencing [name] dull pain to affected area(s). Severity: Patient states wound are getting worse. Duration: Patient has had the wound for > 3 months prior to seeking treatment at the wound center Timing: Pain in wound is Intermittent (comes and goes Context: The wound would happen gradually Modifying Factors: Other treatment(s) tried include:Neosporin ointment locally Associated Signs and Symptoms: Patient reports having increase discharge. HPI Description: 61 year old patient who is known to our practice from [name] previous visit here in 2015 and 2017 for [name] similar problem. At that stage, she was  lost to follow-up, because she did not have insurance and did not complete [name] venous ablation. I understand she has now obtained insurance and has an appointment to see Dr. Hart [place] at [facility] in [place], on November 5th. Other than that the patient has had no other issues ======= Old notes This patient was been previously seen at the wound center is being seen by me for the first time today. 67F with h/o lower  extremity swelling presents with [name] left lower extremity ulcer (first presented to [place] on 07/12/14) . the patient is deaf and lip reads and is fairly conversant. She has had venous ablation in 2015 but due to lack of insurance and money she has been unable to follow-up with the vascular surgeons not she been wearing compression garments. Does not have [name] past medical history of diabetes or other issues. He has not been wearing compression stockings and we did try to get her Velcro wraps but she is unable to afford the copayment. Addendum: I have found some reports from August 2015 when an arterial study was done and the left lower extremity had triphasic waveform showing no arterial insufficiency. She was also to found to have left greater saphenous vein reflux throughout with tortuous veins and on 08/27/2014 she had [name] laser ablation done. At [name] follow-up on September 8 it was found that she needed [name] second procedure as [name] first procedure was not completely successful. Due to the patient's insurance issues she has not gone back for the second procedure. 03/02/2016 -- he is here with the interpreter today and we have had [name] thorough discussion about all the above and all questions answered. ========== Electronic Signature(s) Signed: 10/11/2017 4:04:35 PM By: Evlyn Kanner MD, FACS Previous Signature: 10/11/2017 3:28:50 PM Version By: Evlyn Kanner MD, FACS Entered By: Evlyn Kanner on 10/11/2017 16:04:35 [name], [name]  ([id number]) -------------------------------------------------------------------------------- Physical Exam Details Patient Name: [name], [name] Date of Service: 10/11/2017 3:30 PM Medical Record Number: [id number] Patient Account Number: [id number] Date of Birth/Sex: 20-Oct-1956 (61 y.o. Female) Treating RN: Huel Coventry Primary Care Provider: Karie Fetch Other Clinician: Referring Provider: Karie Fetch Treating Provider/Extender: Rudene Re in Treatment: 1 Constitutional . Pulse regular. Respirations normal and unlabored. Afebrile. . Eyes Nonicteric. Reactive to light. Ears, Nose, Mouth, and Throat Lips, teeth, and gums WNL.Marland Kitchen Moist mucosa without lesions. Neck supple and nontender. No palpable supraclavicular or cervical adenopathy. Normal sized without goiter. Respiratory WNL. No retractions.. Cardiovascular Pedal Pulses WNL. No clubbing, cyanosis or edema. Lymphatic No adneopathy. No adenopathy. No adenopathy. Musculoskeletal Adexa without tenderness or enlargement.. Digits and nails w/o clubbing, cyanosis, infection, petechiae, ischemia, or inflammatory conditions.. Integumentary (Hair, Skin) No suspicious lesions. No crepitus or fluctuance. No peri-wound warmth or erythema. No masses.Marland Kitchen Psychiatric Judgement and insight Intact.. No evidence of depression, anxiety, or agitation.. Notes the wound had some amount of subcutaneous debris which I sharply removed with [name] #3 curet and minimal bleeding controlled with pressure Electronic Signature(s) Signed: 10/11/2017 4:05:05 PM By: Evlyn Kanner MD, FACS Entered By: Evlyn Kanner on 10/11/2017 16:05:05 Baldonado, [name] ([id number]) -------------------------------------------------------------------------------- Physician Orders Details Patient Name: [name], [name] Date of Service: 10/11/2017 3:30 PM Medical Record Number: [id number] Patient Account Number: [id number] Date of Birth/Sex: 23-Apr-1956 (61  y.o. 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 Anesthetic Wound #3 Left,Medial,Anterior Lower Leg o Topical Lidocaine 4% cream applied to wound bed prior to debridement Skin Barriers/Peri-Wound Care Wound #3 Left,Medial,Anterior Lower Leg o Antifungal cream Primary Wound Dressing  Wound #3 Left,Medial,Anterior Lower Leg o Silvercel Non-Adherent Secondary Dressing Wound #3 Left,Medial,Anterior Lower Leg o ABD pad Dressing Change Frequency Wound #3 Left,Medial,Anterior Lower Leg o Change dressing every week Follow-up Appointments Wound #3 Left,Medial,Anterior Lower Leg o Return Appointment in 1 week. o Nurse Visit as needed Edema Control Wound #3 Left,Medial,Anterior Lower Leg o 3 Layer Compression System - Left Lower Extremity [name], [name] ([id number]) Electronic Signature(s) Signed: 10/11/2017 4:07:58 PM By: Evlyn Kanner MD, FACS Signed: 10/11/2017 4:13:04 PM By: Elliot Gurney, BSN, RN, CWS, Kim RN, BSN Entered By: Elliot Gurney, BSN, RN, CWS, Kim on 10/11/2017 16:07:26 [name], [name] ([id number]) -------------------------------------------------------------------------------- Problem List Details Patient Name: [name], [name] Date of Service: 10/11/2017 3:30 PM Medical Record Number: [id number] Patient Account Number: [id number] Date of Birth/Sex: 06/02/1956 (61 y.o. 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 ([id number]) -------------------------------------------------------------------------------- Progress Note Details Patient Name: [name], [name] Date of Service: 10/11/2017 3:30 PM Medical Record Number: [id number] Patient Account Number: [id number] Date of Birth/Sex: 05-03-1956 (61 y.o. Female) Treating RN: Huel Coventry Primary Care Provider: Karie Fetch Other Clinician: Referring Provider: Karie Fetch Treating Provider/Extender: Rudene Re in Treatment: 1 Subjective Chief Complaint Information obtained from Patient Patient presents for treatment of an open ulcer due to venous insufficiency which she's had for several months. she is [name] return patient who we had seen several years ago and has not been able to get appropriate treatment due to the lack of insurance. History of Present Illness (HPI) The following HPI elements were documented for the patient's wound: Location: swelling of the left lower extremity with ulceration Quality: Patient reports experiencing [name] dull pain to affected area(s). Severity: Patient states wound are getting worse. Duration: Patient has had the wound for > 3 months prior to seeking treatment at the wound center Timing: Pain in wound is Intermittent (comes and goes Context: The wound  would happen gradually Modifying Factors: Other treatment(s) tried include:Neosporin ointment locally Associated Signs and Symptoms: Patient reports having increase discharge. 61 year old patient who is known to our practice from [name] previous visit here in 2015 and 2017 for [name] similar problem. At that stage, she was lost to follow-up, because she did not have insurance and did not complete [name] venous ablation. I understand she has now obtained insurance and has an appointment to see Dr. Hart [place] at [facility] in [place], on November 5th. Other than that the patient has had no other issues ======= Old notes This patient was been previously seen at the wound center is being seen by me for the first time today. 70F with h/o lower extremity swelling presents with [name] left lower extremity ulcer (first presented to [place] on 07/12/14) . the patient is deaf and lip reads and is fairly conversant. She has had venous ablation in 2015 but due to lack of insurance and money she has been unable to follow-up with the vascular surgeons not she been wearing compression garments. Does not have [name] past medical history of  diabetes or other issues. He has not been wearing compression stockings and we did try to get her Velcro wraps but she is unable to afford the copayment. Addendum: I have found some reports from August 2015 when an arterial study was done and the left lower extremity had triphasic waveform showing no arterial insufficiency. She was also to found to have left greater saphenous vein reflux throughout with tortuous veins and on 08/27/2014 she had [name] laser ablation done. At [name] follow-up on September 8 it was found that she needed [name] second procedure as [name] first procedure was not completely successful. Due to the patient's insurance issues she has not gone back for the second procedure. 03/02/2016 -- he is here with the interpreter today and we have had [name] thorough discussion about all the above and  all questions answered. ========== Moler, [name] ([id number]) Objective Constitutional Pulse regular. Respirations normal and unlabored. Afebrile. Vitals Time Taken: 3:41 PM, Height: 60 in, Weight: 230 lbs, BMI: 44.9, Temperature: 98.2 F, Pulse: 65 bpm, Respiratory Rate: 16 breaths/min, Blood Pressure: 156/68 mmHg. Eyes Nonicteric. Reactive to light. Ears, Nose, Mouth, and Throat Lips, teeth, and gums WNL.Marland Kitchen. Moist mucosa without lesions. Neck supple and nontender. No palpable supraclavicular or cervical adenopathy. Normal sized without goiter. Respiratory WNL. No retractions.. Cardiovascular Pedal Pulses WNL. No clubbing, cyanosis or edema. Lymphatic No adneopathy. No adenopathy. No adenopathy. Musculoskeletal Adexa without tenderness or enlargement.. Digits and nails w/o clubbing, cyanosis, infection, petechiae, ischemia, or inflammatory conditions.Marland Kitchen. Psychiatric Judgement and insight Intact.. No evidence of depression, anxiety, or agitation.. General Notes: the wound had some amount of subcutaneous debris which I sharply removed with [name] #3 curet and minimal bleeding controlled with pressure Integumentary (Hair, Skin) No suspicious lesions. No crepitus or fluctuance. No peri-wound warmth or erythema. No masses.. Wound #3 status is Open. Original cause of wound was Gradually Appeared. The wound is located on the Left,Medial,Anterior Lower Leg. The wound measures 2cm length x 1.8cm width x 0.1cm depth; 2.827cm^2 area and 0.283cm^3 volume. There is Fat Layer (Subcutaneous Tissue) Exposed exposed. There is no tunneling or undermining noted. There is [name] medium amount of serosanguineous drainage noted. The wound margin is flat and intact. There is medium (34-66%) red, hyper - granulation within the wound bed. There is [name] medium (34-66%) amount of necrotic tissue within the wound bed including Adherent Slough. The periwound skin appearance exhibited: Induration, Hemosiderin Staining.  The periwound skin appearance did not exhibit: Callus, Crepitus, Excoriation, Rash, Scarring, Dry/Scaly, Maceration, Atrophie Blanche, Cyanosis, Ecchymosis, Mottled, Pallor, Rubor, Erythema. Periwound temperature was noted as No Abnormality. Assessment Mcmichen, [name] 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 There was [name] Skin/Subcutaneous Tissue Debridement ([id number]) debridement with total area of 3.6 sq cm performed by Evlyn KannerBritto, Sharae Zappulla, MD. with the following instrument(s): Curette to remove Viable and Non-Viable tissue/material including Exudate, Fibrin/Slough, and Subcutaneous after achieving pain control using Other (lidocaine 4%). [name] time out was conducted at 15:56, prior to the start of the procedure. [name] Minimum amount of bleeding was controlled with Pressure. The procedure was tolerated well with [name] pain level of 0 throughout and [name] pain level of 0 following  the procedure. Post Debridement Measurements: 2cm length x 1.8cm width x 0.2cm depth; 0.565cm^3 volume. Character of Wound/Ulcer Post Debridement is stable. Severity of Tissue Post Debridement is: Fat layer exposed. Post procedure Diagnosis Wound #3: Same as Pre-Procedure Plan Wound Cleansing: Wound #3 Left,Medial,Anterior Lower Leg: May Shower, gently pat wound dry prior to applying new dressing. Anesthetic: Wound #3 Left,Medial,Anterior Lower Leg: Topical Lidocaine 4% cream applied to wound bed prior to debridement Skin Barriers/Peri-Wound Care: Wound #3 Left,Medial,Anterior Lower Leg: Antifungal cream Primary Wound Dressing: Wound  #3 Left,Medial,Anterior Lower Leg: Silvercel Non-Adherent Secondary Dressing: Wound #3 Left,Medial,Anterior Lower Leg: ABD pad Dressing Change Frequency: Wound #3 Left,Medial,Anterior Lower Leg: Change dressing every week Follow-up Appointments: Wound #3 Left,Medial,Anterior Lower Leg: Return Appointment in 1 week. Nurse Visit as needed Edema Control: Wound #3 Left,Medial,Anterior Lower Leg: 3 Layer Compression System - Left Lower Extremity Herringshaw, [name] ([id number]) After sharp debridement and review of her condition, I have recommended: 1. silver alginate with [name] 3 layer Profore 2. Elevation and exercise discussed with her in great detail 3. to keep her appointment with the vascular surgeon on November 5 at [facility] 4. Regular visits to the wound center Electronic Signature(s) Signed: 10/14/2017 7:55:58 AM By: Evlyn Kanner MD, FACS Previous Signature: 10/11/2017 4:06:15 PM Version By: Evlyn Kanner MD, FACS Entered By: Evlyn Kanner on 10/14/2017 07:55:58 Stahly, [name] ([id number]) -------------------------------------------------------------------------------- SuperBill Details Patient Name: [name], [name] Date of Service: 10/11/2017 Medical Record Number: [id number] Patient Account Number: [id number] Date of Birth/Sex: [birth year] (62 y.o. 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222 Varicose veins of left lower extremity with both ulcer of calf an E66.01 Morbid  (severe) obesity due to excess calories Modifier: d inflammation Quantity: 1 Electronic Signature(s) Signed: 10/11/2017 4:06:27 PM By: Evlyn Kanner MD, FACS Entered By: Evlyn Kanner on 10/11/2017 16:06:27

## 2017-10-15 NOTE — Progress Notes (Addendum)
[name] [name] A. ([id number]) Visit Report for 10/14/2017 Arrival Information Details Patient Name: [name], [name] A. Date of Service: 10/14/2017 3:45 PM Medical Record Number: [id number] Patient Account Number: [id number] Date of Birth/Sex: October 07, 1956 (61 y.o. Female) Treating RN: Ashok Cordia, Debi Primary Care Raul Winterhalter: Karie Fetch Other Clinician: Referring Dannisha Eckmann: Karie Fetch Treating Delorise Hunkele/Extender: Rudene Re in Treatment: 1 Visit Information History Since Last Visit All ordered tests and consults were completed: No Patient Arrived: Ambulatory Added or deleted any medications: No Arrival Time: 15:43 Any new allergies or adverse reactions: No Accompanied By: self Had a fall or experienced change in No Transfer Assistance: None activities of daily living that may affect Patient Identification Verified: Yes risk of falls: Secondary Verification Process Yes Signs or symptoms of abuse/neglect since last visito No Completed: Hospitalized since last visit: No Patient Requires Transmission-Based No Has Dressing in Place as Prescribed: Yes Precautions: Has Compression in Place as Prescribed: Yes Patient Has Alerts: Yes Pain Present Now: No Patient Alerts: Patient on Blood Thinner NOT DIABETIC Electronic Signature(s) Signed: 10/14/2017 4:03:17 PM By: Alejandro Mulling Entered By: Alejandro Mulling on 10/14/2017 16:03:17 [name], [name] A. ([id number]) -------------------------------------------------------------------------------- Encounter Discharge Information Details Patient Name: [name] [name] A. Date of Service: 10/14/2017 3:45 PM Medical Record Number: [id number] Patient Account Number: [id number] Date of Birth/Sex: 05-09-1956 (61 y.o. Female) Treating RN: Ashok Cordia, Debi Primary Care Geri Hepler: Karie Fetch Other Clinician: Referring Letetia Romanello: Karie Fetch Treating Tymika Grilli/Extender: Rudene Re in Treatment: 1 Encounter Discharge  Information Items Discharge Pain Level: 0 Discharge Condition: Stable Ambulatory Status: Ambulatory Discharge Destination: Home Private Transportation: Auto Accompanied By: self Schedule Follow-up Appointment: Yes Medication Reconciliation completed and No provided to Patient/Care Kiron Osmun: Clinical Summary of Care: Electronic Signature(s) Signed: 10/14/2017 4:05:11 PM By: Alejandro Mulling Entered By: Alejandro Mulling on 10/14/2017 16:05:11 [name] [name] ([id number]) -------------------------------------------------------------------------------- Patient/Caregiver Education Details Patient Name: Salts, [name] A. Date of Service: 10/14/2017 3:45 PM Medical Record Number: [id number] Patient Account Number: [id number] Date of Birth/Gender: [birth year] (61 y.o. Female) Treating RN: Ashok Cordia, Debi Primary Care Physician: Karie Fetch Other Clinician: Referring Physician: Karie Fetch Treating Physician/Extender: Rudene Re in Treatment: 1 Education Assessment Education Provided To: Patient Education Topics Provided Wound/Skin Impairment: Handouts: Other: do not get dressing wet Methods: Demonstration, Explain/Verbal Responses: State content correctly Electronic Signature(s) Signed: 10/14/2017 4:51:17 PM By: Alejandro Mulling Entered By: Alejandro Mulling on 10/14/2017 16:04:54 [name] [name] A. ([id number]) -------------------------------------------------------------------------------- Wound Assessment Details Patient Name: [name] [name] A. Date of Service: 10/14/2017 3:45 PM Medical Record Number: [id number] Patient Account Number: [id number] Date of Birth/Sex: May 25, 1956 (61 y.o. Female) Treating RN: Ashok Cordia, Debi Primary Care Kosei Rhodes: Karie Fetch Other Clinician: Referring Betzalel Umbarger: Karie Fetch Treating Milika Ventress/Extender: Rudene Re in Treatment: 1 Wound Status Wound Number: 3 Primary Venous Leg Ulcer Etiology: Wound  Location: Left Lower Leg - Medial, Anterior Wound Open Wounding Event: Gradually Appeared Status: Date Acquired: 07/29/2017 Comorbid Middle ear problems, Hypertension, Weeks Of Treatment: 1 History: Peripheral Venous Disease, Osteoarthritis Clustered Wound: No Wound Measurements Length: (cm) 2 Width: (cm) 1.8 Depth: (cm) 0.1 Area: (cm) 2.827 Volume: (cm) 0.283 % Reduction in Area: 28% % Reduction in Volume: 63.9% Epithelialization: None Tunneling: No Undermining: No Wound Description Full Thickness Without Exposed Support Classification: Structures Wound Margin: Flat and Intact Exudate Large Amount: Exudate Type: Serosanguineous Exudate Color: red, brown Foul Odor After Cleansing: No Slough/Fibrino Yes Wound Bed Granulation Amount: Medium (34-66%) Exposed Structure Granulation Quality: Red, Hyper-granulation Fascia Exposed: No Necrotic Amount: Medium (34-66%) Fat Layer (Subcutaneous Tissue) Exposed: Yes Necrotic Quality: Adherent Slough  Tendon Exposed: No Muscle Exposed: No Joint Exposed: No Bone Exposed: No Periwound Skin Texture Texture Color No Abnormalities Noted: No No Abnormalities Noted: No Callus: No Atrophie Blanche: No Crepitus: No Cyanosis: No Excoriation: No Ecchymosis: No Induration: Yes Erythema: No Rash: No Hemosiderin Staining: Yes Scarring: No Mottled: No Pallor: No Moisture Rubor: No No Abnormalities Noted: No Dry / Scaly: No Temperature / Pain [name] [name] A. ([id number]) Maceration: No Temperature: No [name] Tenderness on Palpation: Yes Wound Preparation Ulcer Cleansing: Rinsed/Irrigated with Saline Topical Anesthetic Applied: None Treatment Notes Wound #3 (Left, Medial, Anterior Lower Leg) 1. Cleansed with: Clean wound with Normal Saline Cleanse wound with antibacterial soap and water 3. Peri-wound Care: Antifungal cream 4. Dressing Applied: Other dressing (specify in notes) 5. Secondary Dressing  Applied ABD Pad 7. Secured with Tape 3 Layer Compression System - Left Lower Extremity Notes [employer] Electronic Signature(s) Signed: 10/14/2017 4:03:43 PM By: Alejandro MullingPinkerton, Debra Entered By: Alejandro MullingPinkerton, Debra on 10/14/2017 16:03:42

## 2017-10-18 ENCOUNTER — Encounter: Payer: BLUE CROSS/BLUE SHIELD | Admitting: Physician Assistant

## 2017-10-18 DIAGNOSIS — I83222 Varicose veins of left lower extremity with both ulcer of calf and inflammation: Secondary | ICD-10-CM | POA: Diagnosis not present

## 2017-10-21 NOTE — Progress Notes (Signed)
[name], [name] ([id number]) Visit Report for 10/18/2017 Chief Complaint Document Details Patient Name: [name], [name] Date of Service: 10/18/2017 3:15 PM Medical Record Number: [id number] Patient Account Number: [id number] Date of Birth/Sex: May 20, 1956 (61 y.o. Female) Treating RN: Curtis Sites Primary Care Provider: Karie Fetch Other Clinician: Referring Provider: Karie Fetch Treating Provider/Extender: Linwood Dibbles, Aida Lemaire Weeks in Treatment: 2 Information Obtained from: Patient Chief Complaint Patient presents for treatment of an open ulcer due to venous insufficiency which she's had for several months. she is [name] return patient who we had seen several years ago and has not been able to get appropriate treatment due to the lack of insurance. Electronic Signature(s) Signed: 10/18/2017 5:05:22 PM By: Lenda Kelp PA-C Entered By: Lenda Kelp on 10/18/2017 15:23:59 [name], [name] ([id number]) -------------------------------------------------------------------------------- Debridement Details Patient Name: [name], [name] Date of Service: 10/18/2017 3:15 PM Medical Record Number: [id number] Patient Account Number: [id number] Date of Birth/Sex: [birth year] (61 y.o. Female) Treating RN: Curtis Sites Primary Care Provider: Karie Fetch Other Clinician: Referring Provider: Karie Fetch Treating Provider/Extender: STONE III, Nicole Hafley Weeks in Treatment: 2 Debridement Performed for Wound #3 Left,Medial,Anterior Lower Leg Assessment: Performed By: Physician STONE III, Eivan Gallina E., PA-C Debridement: Debridement Severity of Tissue Pre Fat layer exposed Debridement: Pre-procedure Verification/Time Yes - 15:53 Out Taken: Start Time: 15:53 Pain Control: Lidocaine 4% Topical Solution Level: Skin/Subcutaneous Tissue Total Area Debrided (L x W): 2 (cm) x 1.7 (cm) = 3.4 (cm) Tissue and other material Viable, Non-Viable, Fibrin/Slough,  Subcutaneous debrided: Instrument: Curette Bleeding: Minimum Hemostasis Achieved: Pressure End Time: 15:55 Procedural Pain: 0 Post Procedural Pain: 0 Response to Treatment: Procedure was tolerated well Post Debridement Measurements of Total Wound Length: (cm) 2 Width: (cm) 1.7 Depth: (cm) 0.2 Volume: (cm) 0.534 Character of Wound/Ulcer Post Debridement: Improved Severity of Tissue Post Debridement: Fat layer exposed Post Procedure Diagnosis Same as Pre-procedure Electronic Signature(s) Signed: 10/18/2017 4:32:17 PM By: Curtis Sites Signed: 10/18/2017 5:05:22 PM By: Lenda Kelp PA-C Entered By: Curtis Sites on 10/18/2017 15:55:01 [name], [name] ([id number]) -------------------------------------------------------------------------------- HPI Details Patient Name: [name], [name] Date of Service: 10/18/2017 3:15 PM Medical Record Number: [id number] Patient Account Number: [id number] Date of Birth/Sex: Jan 03, 1956 (61 y.o. Female) Treating RN: Curtis Sites Primary Care Provider: Karie Fetch Other Clinician: Referring Provider: Karie Fetch Treating Provider/Extender: STONE III, Rolene Andrades Weeks in Treatment: 2 History of Present Illness Location: swelling of the left lower extremity with ulceration Quality: Patient reports experiencing [name] dull pain to affected area(s). Severity: Patient states wound are getting worse. Duration: Patient has had the wound for > 3 months prior to seeking treatment at the wound center Timing: Pain in wound is Intermittent (comes and goes Context: The wound would happen gradually Modifying Factors: Other treatment(s) tried include:Neosporin ointment locally Associated Signs and Symptoms: Patient reports having increase discharge. HPI Description: 61 year old patient who is known to our practice from [name] previous visit here in 2015 and 2017 for [name] similar problem. At that stage, she was lost to follow-up, because she did not have  insurance and did not complete [name] venous ablation. I understand she has now obtained insurance and has an appointment to see Dr. Hart Rochester at vein and vascular in [place], on November 5th. Other than that the patient has had no other issues 10/18/17 on evaluation today patient appears to be doing okay in regard to her left anterior lower extremity wound. She tells me she does have some discomfort but the wraps that we are performing which is [name] three  layer wrap seems to be of benefit for her. She also has an appointment upcoming with Dr. Hart Rochester from November 5 to discuss options for preventing these type of venous ulcers from occurring in the future. No fevers chills noted. ======= Old notes This patient was been previously seen at the wound center is being seen by me for the first time today. 61F with h/o lower extremity swelling presents with [name] left lower extremity ulcer (first presented to [place] on 07/12/14) . the patient is deaf and lip reads and is fairly conversant. She has had venous ablation in 2015 but due to lack of insurance and money she has been unable to follow-up with the vascular surgeons not she been wearing compression garments. Does not have [name] past medical history of diabetes or other issues. He has not been wearing compression stockings and we did try to get her Velcro wraps but she is unable to afford the copayment. Addendum: I have found some reports from August 2015 when an arterial study was done and the left lower extremity had triphasic waveform showing no arterial insufficiency. She was also to found to have left greater saphenous vein reflux throughout with tortuous veins and on 08/27/2014 she had [name] laser ablation done. At [name] follow-up on September 8 it was found that she needed [name] second procedure as [name] first procedure was not completely successful. Due to the patient's insurance issues she has not gone back for the second procedure. 03/02/2016 -- he is here with the  interpreter today and we have had [name] thorough discussion about all the above and all questions answered. ========== Electronic Signature(s) Signed: 10/18/2017 5:05:22 PM By: Lenda Kelp PA-C Entered By: Lenda Kelp on 10/18/2017 16:13:30 Polak, [name] ([id number]) -------------------------------------------------------------------------------- Physical Exam Details Patient Name: [name], [name] Date of Service: 10/18/2017 3:15 PM Medical Record Number: [id number] Patient Account Number: [id number] Date of Birth/Sex: 1956/11/02 (61 y.o. Female) Treating RN: Curtis Sites Primary Care Provider: Karie Fetch Other Clinician: Referring Provider: Karie Fetch Treating Provider/Extender: STONE III, Neshia Mckenzie Weeks in Treatment: 2 Constitutional sitting or standing blood pressure is within target range for patient.. pulse regular and within target range for patient.Marland Kitchen respirations regular, non-labored and within target range for patient.Marland Kitchen temperature within target range for patient.. Well- nourished and well-hydrated in no acute distress. Respiratory normal breathing without difficulty. Cardiovascular 1+ pitting edema of the bilateral lower extremities. Psychiatric this patient is able to make decisions and demonstrates good insight into disease process. Alert and Oriented x 3. pleasant and cooperative. Notes Patient did have slough covering the wound on evaluation today and this did required debridement. She tolerated this without complication and only minimal discomfort. Electronic Signature(s) Signed: 10/18/2017 5:05:22 PM By: Lenda Kelp PA-C Entered By: Lenda Kelp on 10/18/2017 16:14:27 [name], [name] ([id number]) -------------------------------------------------------------------------------- Physician Orders Details Patient Name: [name], [name] Date of Service: 10/18/2017 3:15 PM Medical Record Number: [id number] Patient Account Number:  [id number] Date of Birth/Sex: June 06, 1956 (61 y.o. 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  Anesthetic Wound #3 Left,Medial,Anterior Lower Leg o Topical Lidocaine 4% cream applied to wound bed prior to debridement Skin Barriers/Peri-Wound Care Wound #3 Left,Medial,Anterior Lower Leg o Antifungal cream Primary Wound Dressing Wound #3 Left,Medial,Anterior Lower Leg o Silvercel Non-Adherent Secondary Dressing Wound #3 Left,Medial,Anterior Lower Leg o ABD pad o XtraSorb Dressing Change Frequency Wound #3 Left,Medial,Anterior Lower Leg o Change dressing every week Follow-up Appointments Wound #3 Left,Medial,Anterior Lower Leg o Return Appointment in 1 week. o Nurse Visit as needed Edema Control Wound #3 Left,Medial,Anterior Lower Leg o 3 Layer Compression System - Left Lower Extremity [name], [name] ([id number]) Electronic Signature(s) Signed: 10/18/2017 4:32:17 PM By: Curtis Sitesorthy, Joanna Signed: 10/18/2017 5:05:22 PM By: Lenda KelpStone III, Otillia Cordone PA-C Entered By: Curtis Sitesorthy, Joanna on 10/18/2017 15:56:39 [name], [name] ([id number]) -------------------------------------------------------------------------------- Problem List Details Patient Name: Schnackenberg, [name] Date of Service: 10/18/2017 3:15 PM Medical Record Number: [id number] Patient Account Number: [id number] Date of Birth/Sex: [birth year] (61 y.o.  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 ([id number]) -------------------------------------------------------------------------------- Progress Note Details Patient Name: Larsh, [name] Date of Service: 10/18/2017 3:15 PM Medical Record Number: [id number] Patient Account Number: [id number] Date of Birth/Sex: [birth year] (61 y.o. Female) Treating RN: Curtis Sitesorthy, Joanna Primary Care Provider: Karie FetchAYCOCK, NGWE Other Clinician: Referring Provider: Karie FetchAYCOCK, NGWE Treating Provider/Extender: Linwood DibblesSTONE III, Darel Ricketts Weeks in Treatment: 2 Subjective Chief Complaint Information obtained from Patient Patient presents for treatment of an open ulcer due to venous insufficiency which she's had for several months. she is [name] return patient who we had seen several years ago and has not been able to get appropriate treatment due to the lack of insurance. History of Present Illness (HPI) The following HPI elements were documented for the patient's wound: Location: swelling of the left lower extremity with ulceration Quality: Patient reports experiencing [name] dull pain to affected area(s). Severity: Patient states wound are getting worse. Duration: Patient has had the wound for > 3 months prior to seeking treatment at the wound center Timing: Pain in wound is  Intermittent (comes and goes Context: The wound would happen gradually Modifying Factors: Other treatment(s) tried include:Neosporin ointment locally Associated Signs and Symptoms: Patient reports having increase discharge. 61 year old patient who is known to our practice from [name] previous visit here in 2015 and 2017 for [name] similar problem. At that stage, she was lost to follow-up, because she did not have insurance and did not complete [name] venous ablation. I understand she has now obtained insurance and has an appointment to see Dr. Hart RochesterLawson at [facility] in [place], on November 5th. Other than that the patient has had no other issues 10/18/17 on evaluation today patient appears to be doing okay in regard to her left anterior lower extremity wound. She tells me she does have some discomfort but the wraps that we are performing which is [name] three layer wrap seems to be of benefit for her. She also has an appointment upcoming with Dr. Hart RochesterLawson from November 5 to discuss options for preventing  these type of venous ulcers from occurring in the future. No fevers chills noted. ======= Old notes This patient was been previously seen at the wound center is being seen by me for the first time today. 39F with h/o lower extremity swelling presents with [name] left lower extremity ulcer (first presented to [place] on 07/12/14) . the patient is deaf and lip reads and is fairly conversant. She has had venous ablation in 2015 but due to lack of insurance and money she has been unable to follow-up with the vascular surgeons not she been wearing compression garments. Does not have [name] past medical history of diabetes or other issues. He has not been wearing compression stockings and we did try to get her Velcro wraps but she is unable to afford the copayment. Addendum: I have found some reports from August 2015 when an arterial study was done and the left lower extremity had triphasic waveform showing no arterial  insufficiency. She was also to found to have left greater saphenous vein reflux throughout with tortuous veins and on 08/27/2014 she had [name] laser ablation done. At [name] follow-up on September 8 it was found that she needed [name] second procedure as [name] first procedure was not completely successful. Due to the patient's insurance issues she has not gone back for the second procedure. 03/02/2016 -- he is here with the interpreter today and we have had [name] thorough discussion about all the above and all questions answered. [name], [name] ([id number]) ========== Patient History Information obtained from Patient. Family History Cancer, Diabetes - Mother, Heart Disease - Mother,Father, Hypertension - Mother, Kidney Disease - Mother, Lung Disease - Mother, Stroke - Mother, Thyroid Problems - Mother, No family history of Seizures, Tuberculosis. Social History Former smoker - quit 2005, Marital Status - Divorced, Alcohol Use - Never, Drug Use - No History, Caffeine Use - Daily. Medical History Hospitalization/Surgery History - 06/23/2004, [facility] Removal. Medical And Surgical History Notes Ear/Nose/Mouth/Throat 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) Review of Systems (ROS) Constitutional Symptoms (General Health) Denies complaints or symptoms of Fever, Chills. Respiratory The patient has no complaints or symptoms. Cardiovascular Complains or has symptoms of LE edema. Psychiatric Denies complaints or symptoms of Anxiety. Objective Constitutional sitting or standing blood pressure is within target range for patient.. pulse regular and within target range for patient.Marland Kitchen respirations regular, non-labored and within target range for patient.Marland Kitchen temperature within target range for patient.. Well- nourished and well-hydrated in no acute distress. Vitals Time Taken: 3:22 PM, Height: 60 in, Weight: 230 lbs, BMI: 44.9, Temperature: 98.3 F, Pulse: 57 bpm, Respiratory Rate: 18  breaths/min, Blood Pressure: 134/87 mmHg. Respiratory normal breathing without difficulty. Cardiovascular 1+ pitting edema of the bilateral lower extremities. Psychiatric this patient is able to make decisions and demonstrates good insight into disease process. Alert and Oriented x 3. pleasant [name], [name] ([id number]) and cooperative. General Notes: Patient did have slough covering the wound on evaluation today and this did required debridement. She tolerated this without complication and only minimal discomfort. Integumentary (Hair, Skin) Wound #3 status is Open. Original cause of wound was Gradually Appeared. The wound is located on the Left,Medial,Anterior Lower Leg. The wound measures 2cm length x 1.7cm width x 0.1cm depth; 2.67cm^2 area and 0.267cm^3 volume. There is Fat Layer (Subcutaneous Tissue) Exposed exposed. There is no tunneling or undermining noted. There is [name] large amount of serosanguineous drainage noted. The wound margin is flat and intact. There is large (67- 100%) red, hyper -  granulation within the wound bed. There is [name] small (1-33%) amount of necrotic tissue within the wound bed including Adherent Slough. The periwound skin appearance exhibited: Induration, Hemosiderin Staining. The periwound skin appearance did not exhibit: Callus, Crepitus, Excoriation, Rash, Scarring, Dry/Scaly, Maceration, Atrophie Blanche, Cyanosis, Ecchymosis, Mottled, Pallor, Rubor, Erythema. Periwound temperature was noted as No Abnormality. The periwound has tenderness on palpation. 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 There was [name] Skin/Subcutaneous Tissue Debridement ([id number]) debridement with total area of 3.4 sq cm performed by STONE III, Cybil Sen[place] E., PA-C. with the following instrument(s): Curette to remove Viable and Non-Viable tissue/material including Fibrin/Slough and Subcutaneous after achieving pain control using Lidocaine 4% Topical Solution. [name] time out was conducted at 15:53, prior to the start of the procedure. [name] Minimum amount of bleeding was controlled with Pressure. The procedure was tolerated well with [name] pain level of 0 throughout and [name] pain level of 0 following the procedure. Post Debridement Measurements: 2cm length x 1.7cm width x 0.2cm depth; 0.534cm^3 volume. Character of Wound/Ulcer Post Debridement is improved. Severity of Tissue Post Debridement is: Fat layer exposed. Post procedure Diagnosis Wound #3: Same as Pre-Procedure Plan Wound Cleansing: Wound #3 Left,Medial,Anterior Lower Leg: May Shower, gently pat wound dry prior to applying new dressing. Anesthetic: Fry, [name] ([id number]) Wound #3 Left,Medial,Anterior Lower Leg: Topical Lidocaine 4% cream applied to wound bed prior to debridement Skin Barriers/Peri-Wound Care: Wound #3 Left,Medial,Anterior Lower Leg: Antifungal cream Primary Wound Dressing: Wound #3 Left,Medial,Anterior Lower Leg: Silvercel Non-Adherent Secondary Dressing: Wound #3 Left,Medial,Anterior Lower Leg: ABD pad XtraSorb Dressing Change Frequency: Wound #3 Left,Medial,Anterior Lower Leg: Change dressing every week Follow-up Appointments: Wound #3 Left,Medial,Anterior Lower Leg: Return Appointment in 1 week. Nurse Visit as needed Edema Control: Wound #3 Left,Medial,Anterior Lower Leg: 3 Layer Compression System - Left Lower Extremity I am going to recommend that we continue with the Current wound care measures as this seems to be benefiting her. We will see her for reevaluation in one week To see were  things stand at that point. If anything worsens significantly in the interim patient will contact our office for additional recommendations. Otherwise hopefully she will get good news when she sees the vascular specialist in [place] concerning what can be done to prevent this from occurring again in the future. Electronic Signature(s) Signed: 10/18/2017 5:05:22 PM By: Lenda Kelp PA-C Entered By: Lenda Kelp on 10/18/2017 16:15:16 [name], [name] ([id number]) -------------------------------------------------------------------------------- ROS/PFSH Details Patient Name: Bednarczyk, [name] Date of Service: 10/18/2017 3:15 PM Medical Record Number: [id number] Patient Account Number: [id number] Date of Birth/Sex: Mar 17, 1956 (61 y.o. Female) Treating RN: Curtis Sites Primary Care Provider: Karie Fetch Other Clinician: Referring Provider: Karie Fetch Treating Provider/Extender: STONE III, Anabela Crayton Weeks in Treatment: 2 Information Obtained From Patient Wound History Do you currently have one or more open woundso Yes How many open wounds do you currently haveo 1 Approximately how long have you had your woundso 2 months How have you been treating your wound(s) until nowo neosporin Has your wound(s) ever healed and then re-openedo Yes Have you had any lab work done in the past montho No Have you  tested positive for an antibiotic resistant organism (MRSA, VRE)o No Have you tested positive for osteomyelitis (bone infection)o No Have you had any tests for circulation on your legso Yes Have you had other problems associated with your woundso Infection, Swelling Constitutional Symptoms (General Health) Complaints and Symptoms: Negative for: Fever; Chills Cardiovascular Complaints and Symptoms: Positive for: LE edema Medical History: Positive for: Hypertension; Peripheral Venous Disease Negative for: Angina; Arrhythmia; Congestive Heart Failure; Coronary Artery Disease; Deep  Vein Thrombosis; Hypotension; Myocardial Infarction; Peripheral Arterial Disease; Vasculitis Psychiatric Complaints and Symptoms: Negative for: Anxiety Medical History: Negative for: Anorexia/bulimia; Confinement Anxiety Eyes Medical History: Negative for: Cataracts; Glaucoma; Optic Neuritis Ear/Nose/Mouth/Throat Medical History: Positive for: Middle ear problems Negative for: Chronic sinus problems/congestion Past Medical History Notes: 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 ([id number]) Hematologic/Lymphatic Medical History: Negative for: Anemia; Hemophilia; Human Immunodeficiency Virus; Lymphedema; Sickle Cell Disease Respiratory Complaints and Symptoms: No Complaints or Symptoms Medical History: Negative for: Aspiration; Asthma; Chronic Obstructive Pulmonary Disease (COPD); Pneumothorax; Sleep Apnea; Tuberculosis Gastrointestinal Medical History: Negative for: Cirrhosis ; Colitis; Crohnos; Hepatitis [name]; Hepatitis B; Hepatitis C Endocrine Medical History: Negative for: Type I Diabetes; Type II Diabetes Genitourinary Medical History: Negative for: End Stage Renal Disease Immunological Medical History: Negative for: Lupus Erythematosus; Raynaudos; Scleroderma Integumentary (Skin) Medical History: Negative for: History of Burn; History of pressure wounds Musculoskeletal Medical History: Positive for: Osteoarthritis Negative for: Gout; Rheumatoid Arthritis; Osteomyelitis Neurologic Medical History: Negative for: Dementia; Neuropathy; Quadriplegia; Paraplegia; Seizure Disorder Oncologic Medical History: Negative for: Received Chemotherapy; Received Radiation HBO Extended History Items Ear/Nose/Mouth/Throat: Middle ear problems Blatz, [name] ([id number]) Immunizations Pneumococcal Vaccine: Received Pneumococcal Vaccination: No Implantable Devices Hospitalization / Surgery History Name of Hospital Purpose of  Hospitalization/Surgery Date [facility] Rem[facility] 06/23/2004 Family and Social History Cancer: Yes; Diabetes: Yes - Mother; Heart Disease: Yes - Mother,Father; Hypertension: Yes - Mother; Kidney Disease: Yes - Mother; Lung Disease: Yes - Mother; Seizures: No; Stroke: Yes - Mother; Thyroid Problems: Yes - Mother; Tuberculosis: No; Former smoker - quit 2005; Marital Status - Divorced; Alcohol Use: Never; Drug Use: No History; Caffeine Use: Daily; Financial Concerns: Yes; Food, Clothing or Shelter Needs: No; Support System Lacking: No; Transportation Concerns: No; Advanced Directives: No; Patient does not want information on Advanced Directives; Do not resuscitate: No; Living Will: No; Medical Power of Attorney: No Physician Affirmation I have reviewed and agree with the above information. Electronic Signature(s) Signed: 10/18/2017 4:32:17 PM By: Curtis Sites Signed: 10/18/2017 5:05:22 PM By: Lenda Kelp PA-C Entered By: Lenda Kelp on 10/18/2017 16:13:56 Ginyard, [name] ([id number]) -------------------------------------------------------------------------------- SuperBill Details Patient Name: [name], [name] Date of Service: 10/18/2017 Medical Record Number: [id number] Patient Account Number: [id number] Date of Birth/Sex: 01/21/1956 (61 y.o. 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

## 2017-10-21 NOTE — Progress Notes (Signed)
Sonnier, [name] ([id number]) Visit Report for 10/18/2017 Arrival Information Details Patient Name: [name], [name] Date of Service: 10/18/2017 3:15 PM Medical Record Number: [id number] Patient Account Number: [id number] Date of Birth/Sex: [birth year] (61 y.o. Female) Treating RN: Curtis Sitesorthy, Joanna Primary Care Kiahna Banghart: Karie FetchAYCOCK, NGWE Other Clinician: Referring Chimamanda Siegfried: Karie FetchAYCOCK, NGWE Treating Cattie Tineo/Extender: Linwood DibblesSTONE III, HOYT Weeks in Treatment: 2 Visit Information History Since Last Visit Added or deleted any medications: No Patient Arrived: Ambulatory Any new allergies or adverse reactions: No Arrival Time: 15:19 Had a fall or experienced change in No Accompanied By: self activities of daily living that may affect Transfer Assistance: None risk of falls: Patient Identification Verified: Yes Signs or symptoms of abuse/neglect since last visito No Secondary Verification Process Yes Hospitalized since last visit: No Completed: Has Dressing in Place as Prescribed: Yes Patient Requires Transmission-Based No Has Compression in Place as Prescribed: Yes Precautions: Pain Present Now: No Patient Has Alerts: Yes Patient Alerts: Patient on Blood Thinner NOT DIABETIC Electronic Signature(s) Signed: 10/18/2017 4:32:17 PM By: Curtis Sitesorthy, Joanna Entered By: Curtis Sitesorthy, Joanna on 10/18/2017 15:19:53 [name], [name] ([id number]) -------------------------------------------------------------------------------- Encounter Discharge Information Details Patient Name: [name], [name] Date of Service: 10/18/2017 3:15 PM Medical Record Number: [id number] Patient Account Number: [id number] Date of Birth/Sex: [birth year] (61 y.o. Female) Treating RN: Curtis Sitesorthy, Joanna Primary Care Derreck Wiltsey: Karie FetchAYCOCK, NGWE Other Clinician: Referring Ronell Boldin: Karie FetchAYCOCK, NGWE Treating Ayeshia Coppin/Extender: Linwood DibblesSTONE III, HOYT Weeks in Treatment: 2 Encounter Discharge Information Items Discharge Pain Level: 0 Discharge  Condition: Stable Ambulatory Status: Ambulatory Discharge Destination: Home Transportation: Private Auto Accompanied By: self Schedule Follow-up Appointment: Yes Medication Reconciliation completed and No provided to Patient/Care Kaspar Albornoz: Provided on Clinical Summary of Care: 10/18/2017 Form Type Recipient Paper Patient GR Electronic Signature(s) Signed: 10/21/2017 11:56:58 AM By: Gwenlyn PerkingMoore, Shelia Entered By: Gwenlyn PerkingMoore, Shelia on 10/18/2017 16:13:07 [name], [name] ([id number]) -------------------------------------------------------------------------------- Lower Extremity Assessment Details Patient Name: [name], [name] Date of Service: 10/18/2017 3:15 PM Medical Record Number: [id number] Patient Account Number: [id number] Date of Birth/Sex: [birth year] (61 y.o. Female) Treating RN: Curtis Sitesorthy, Joanna Primary Care Xavyer Steenson: Karie FetchAYCOCK, NGWE Other Clinician: Referring Cameren Odwyer: Karie FetchAYCOCK, NGWE Treating Emrys Mceachron/Extender: STONE III, HOYT Weeks in Treatment: 2 Edema Assessment Assessed: [Left: No] [Right: No] [Left: Edema] [Right: :] Calf Left: Right: Point of Measurement: 30 cm From Medial Instep 52.2 cm cm Ankle Left: Right: Point of Measurement: 10 cm From Medial Instep 22.6 cm cm Vascular Assessment Pulses: Dorsalis Pedis Palpable: [Left:Yes] Posterior Tibial Extremity colors, hair growth, and conditions: Extremity Color: [Left:Hyperpigmented] Hair Growth on Extremity: [Left:Yes] Temperature of Extremity: [Left:Warm] Capillary Refill: [Left:< 3 seconds] Toe Nail Assessment Left: Right: Thick: Yes Discolored: No Deformed: No Improper Length and Hygiene: No Electronic Signature(s) Signed: 10/18/2017 4:32:17 PM By: Curtis Sitesorthy, Joanna Entered By: Curtis Sitesorthy, Joanna on 10/18/2017 15:28:01 [name], [name] ([id number]) -------------------------------------------------------------------------------- Multi Wound Chart Details Patient Name: [name], [name] Date of  Service: 10/18/2017 3:15 PM Medical Record Number: [id number] Patient Account Number: [id number] Date of Birth/Sex: [birth year] (61 y.o. Female) Treating RN: Curtis Sitesorthy, Joanna Primary Care Marcea Rojek: Karie FetchAYCOCK, NGWE Other Clinician: Referring [place] Inthavong: Karie FetchAYCOCK, NGWE Treating Taivon Haroon/Extender: STONE III, HOYT Weeks in Treatment: 2 Vital Signs Height(in): 60 Pulse(bpm): 57 Weight(lbs): 230 Blood Pressure(mmHg): 134/87 Body Mass Index(BMI): 45 Temperature(F): 98.3 Respiratory Rate 18 (breaths/min): Photos: [N/A:N/A] Wound Location: Left Lower Leg - Medial, N/A N/A Anterior Wounding Event: Gradually Appeared N/A N/A Primary Etiology: Venous Leg Ulcer N/A N/A Comorbid History: Middle ear problems, N/A N/A Hypertension, Peripheral Venous Disease, Osteoarthritis Date Acquired: 07/29/2017 N/A N/A Weeks of Treatment: 2 N/A N/A  Wound Status: Open N/A N/A Measurements L x W x D 2x1.7x0.1 N/A N/A (cm) Area (cm) : 2.67 N/A N/A Volume (cm) : 0.267 N/A N/A % Reduction in Area: 32.00% N/A N/A % Reduction in Volume: 66.00% N/A N/A Classification: Full Thickness Without N/A N/A Exposed Support Structures Exudate Amount: Large N/A N/A Exudate Type: Serosanguineous N/A N/A Exudate Color: red, brown N/A N/A Wound Margin: Flat and Intact N/A N/A Granulation Amount: Large (67-100%) N/A N/A Granulation Quality: Red, Hyper-granulation N/A N/A Necrotic Amount: Small (1-33%) N/A N/A Exposed Structures: Fat Layer (Subcutaneous N/A N/A Tissue) Exposed: Yes Fascia: No [name], [name] ([id number]) Tendon: No Muscle: No Joint: No Bone: No Epithelialization: None N/A N/A Periwound Skin Texture: Induration: Yes N/A N/A Excoriation: No Callus: No Crepitus: No Rash: No Scarring: No Periwound Skin Moisture: Maceration: No N/A N/A Dry/Scaly: No Periwound Skin Color: Hemosiderin Staining: Yes N/A N/A Atrophie Blanche: No Cyanosis: No Ecchymosis: No Erythema: No Mottled: No Pallor:  No Rubor: No Temperature: No Abnormality N/A N/A Tenderness on Palpation: Yes N/A N/A Wound Preparation: Ulcer Cleansing: Other: soap N/A N/A and water Topical Anesthetic Applied: Other: lidocaine 4% Treatment Notes Electronic Signature(s) Signed: 10/18/2017 4:32:17 PM By: Curtis Sites Entered By: Curtis Sites on 10/18/2017 15:48:39 Witt, [name] ([id number]) -------------------------------------------------------------------------------- Multi-Disciplinary Care Plan Details Patient Name: [name], [name] Date of Service: 10/18/2017 3:15 PM Medical Record Number: [id number] Patient Account Number: [id number] Date of Birth/Sex: 10-10-1956 (61 y.o. Female) Treating RN: Curtis Sites Primary Care Alaila Pillard: Karie Fetch Other Clinician: Referring Tyreona Panjwani: Karie Fetch Treating Scottlynn Lindell/Extender: Linwood Dibbles, HOYT Weeks in Treatment: 2 Active Inactive ` Orientation to the Wound Care Program Nursing Diagnoses: Knowledge deficit related to the wound healing center program Goals: Patient/caregiver will verbalize understanding of the Wound Healing Center Program Date Initiated: 10/04/2017 Target Resolution Date: 10/21/2017 Goal Status: Active Interventions: Provide education on orientation to the wound center Notes: ` Venous Leg Ulcer Nursing Diagnoses: Actual venous Insuffiency (use after diagnosis is confirmed) Goals: Patient will maintain optimal edema control Date Initiated: 10/04/2017 Target Resolution Date: 10/21/2017 Goal Status: Active Interventions: Assess peripheral edema status every visit. Treatment Activities: Non-invasive vascular studies : 10/04/2017 Notes: ` Wound/Skin Impairment Nursing Diagnoses: Impaired tissue integrity Goals: Ulcer/skin breakdown will have a volume reduction of 30% by week 4 Date Initiated: 10/04/2017 Target Resolution Date: 11/04/2017 Carreon, [name] ([id number]) Goal Status: Active Interventions: Assess  ulceration(s) every visit Treatment Activities: Skin care regimen initiated : 10/04/2017 Topical wound management initiated : 10/04/2017 Notes: Electronic Signature(s) Signed: 10/18/2017 4:32:17 PM By: Curtis Sites Entered By: Curtis Sites on 10/18/2017 15:48:28 Cedrone, [name] ([id number]) -------------------------------------------------------------------------------- Pain Assessment Details Patient Name: Gibeault, [name] Date of Service: 10/18/2017 3:15 PM Medical Record Number: [id number] Patient Account Number: [id number] Date of Birth/Sex: 05/19/1956 (61 y.o. Female) Treating RN: Curtis Sites Primary Care Rad Gramling: Karie Fetch Other Clinician: Referring Adar Rase: Karie Fetch Treating Manami Tutor/Extender: Linwood Dibbles, HOYT Weeks in Treatment: 2 Active Problems Location of Pain Severity and Description of Pain Patient Has Paino No Site Locations Pain Management and Medication Current Pain Management: Notes Topical or injectable lidocaine is offered to patient for acute pain when surgical debridement is performed. If needed, Patient is instructed to use over the counter pain medication for the following 24-48 hours after debridement. Wound care MDs do not prescribed pain medications. Patient has chronic pain or uncontrolled pain. Patient has been instructed to make an appointment with their Primary Care Physician for pain management. Electronic Signature(s) Signed: 10/18/2017 4:32:17 PM By: Curtis Sites Entered  By: Curtis Sites on 10/18/2017 15:22:13 Ahmad, [name] ([id number]) -------------------------------------------------------------------------------- Patient/Caregiver Education Details Patient Name: Barret, [name] Date of Service: 10/18/2017 3:15 PM Medical Record Number: [id number] Patient Account Number: [id number] Date of Birth/Gender: Oct 17, 1956 (61 y.o. Female) Treating RN: Curtis Sites Primary Care Physician: Karie Fetch Other  Clinician: Referring Physician: Karie Fetch Treating Physician/Extender: Skeet Simmer in Treatment: 2 Education Assessment Education Provided To: Patient Education Topics Provided Venous: Handouts: Other: leg elevation Methods: Explain/Verbal Responses: State content correctly Electronic Signature(s) Signed: 10/18/2017 4:32:17 PM By: Curtis Sites Entered By: Curtis Sites on 10/18/2017 15:52:28 Molinaro, [name] ([id number]) -------------------------------------------------------------------------------- Wound Assessment Details Patient Name: Waidelich, [name] Date of Service: 10/18/2017 3:15 PM Medical Record Number: [id number] Patient Account Number: [id number] Date of Birth/Sex: [birth year] (61 y.o. Female) Treating RN: Curtis Sites Primary Care Estaban [place]: Karie Fetch Other Clinician: Referring Mahasin Riviere: Karie Fetch Treating Tu Bayle/Extender: STONE III, HOYT Weeks in Treatment: 2 Wound Status Wound Number: 3 Primary Venous Leg Ulcer Etiology: Wound Location: Left Lower Leg - Medial, Anterior Wound Open Wounding Event: Gradually Appeared Status: Date Acquired: 07/29/2017 Comorbid Middle ear problems, Hypertension, Peripheral Weeks Of Treatment: 2 History: Venous Disease, Osteoarthritis Clustered Wound: No Photos Photo Uploaded By: Curtis Sites on 10/18/2017 15:44:38 Wound Measurements Length: (cm) 2 Width: (cm) 1.7 Depth: (cm) 0.1 Area: (cm) 2.67 Volume: (cm) 0.267 % Reduction in Area: 32% % Reduction in Volume: 66% Epithelialization: None Tunneling: No Undermining: No Wound Description Full Thickness Without Exposed Support Classification: Structures Wound Margin: Flat and Intact Exudate Large Amount: Exudate Type: Serosanguineous Exudate Color: red, brown Foul Odor After Cleansing: No Slough/Fibrino Yes Wound Bed Granulation Amount: Large (67-100%) Exposed Structure Granulation Quality: Red, Hyper-granulation Fascia  Exposed: No Necrotic Amount: Small (1-33%) Fat Layer (Subcutaneous Tissue) Exposed: Yes Necrotic Quality: Adherent Slough Tendon Exposed: No Muscle Exposed: No Joint Exposed: No Bone Exposed: No Jaskulski, [name] ([id number]) Periwound Skin Texture Texture Color No Abnormalities Noted: No No Abnormalities Noted: No Callus: No Atrophie Blanche: No Crepitus: No Cyanosis: No Excoriation: No Ecchymosis: No Induration: Yes Erythema: No Rash: No Hemosiderin Staining: Yes Scarring: No Mottled: No Pallor: No Moisture Rubor: No No Abnormalities Noted: No Dry / Scaly: No Temperature / Pain Maceration: No Temperature: No Abnormality Tenderness on Palpation: Yes Wound Preparation Ulcer Cleansing: Other: soap and water, Topical Anesthetic Applied: Other: lidocaine 4%, Treatment Notes Wound #3 (Left, Medial, Anterior Lower Leg) 1. Cleansed with: Cleanse wound with antibacterial soap and water 2. Anesthetic Topical Lidocaine 4% cream to wound bed prior to debridement 3. Peri-wound Care: Antifungal cream 4. Dressing Applied: Other dressing (specify in notes) 5. Secondary Dressing Applied ABD Pad 7. Secured with 3 Layer Compression System - Left Lower Extremity Notes silvercel, xtrasorb Electronic Signature(s) Signed: 10/18/2017 4:32:17 PM By: Curtis Sites Entered By: Curtis Sites on 10/18/2017 15:32:50 Korf, [name] ([id number]) -------------------------------------------------------------------------------- Vitals Details Patient Name: [name], [name] Date of Service: 10/18/2017 3:15 PM Medical Record Number: [id number] Patient Account Number: [id number] Date of Birth/Sex: 1956/06/03 (61 y.o. Female) Treating RN: Curtis Sites Primary Care Michelena Culmer: Karie Fetch Other Clinician: Referring Myrtie Leuthold: Karie Fetch Treating Genavie Boettger/Extender: STONE III, HOYT Weeks in Treatment: 2 Vital Signs Time Taken: 15:22 Temperature (F): 98.3 Height (in):  60 Pulse (bpm): 57 Weight (lbs): 230 Respiratory Rate (breaths/min): 18 Body Mass Index (BMI): 44.9 Blood Pressure (mmHg): 134/87 Reference Range: 80 - 120 mg / dl Electronic Signature(s) Signed: 10/18/2017 4:32:17 PM By: Curtis Sites Entered By: Curtis Sites on 10/18/2017 15:22:34

## 2017-10-25 ENCOUNTER — Encounter: Payer: BLUE CROSS/BLUE SHIELD | Attending: Surgery | Admitting: Surgery

## 2017-10-25 DIAGNOSIS — I83222 Varicose veins of left lower extremity with both ulcer of calf and inflammation: Secondary | ICD-10-CM | POA: Diagnosis not present

## 2017-10-25 DIAGNOSIS — Z87891 Personal history of nicotine dependence: Secondary | ICD-10-CM | POA: Diagnosis not present

## 2017-10-25 DIAGNOSIS — M1990 Unspecified osteoarthritis, unspecified site: Secondary | ICD-10-CM | POA: Diagnosis not present

## 2017-10-25 DIAGNOSIS — L97222 Non-pressure chronic ulcer of left calf with fat layer exposed: Secondary | ICD-10-CM | POA: Diagnosis not present

## 2017-10-25 DIAGNOSIS — Z6841 Body Mass Index (BMI) 40.0 and over, adult: Secondary | ICD-10-CM | POA: Diagnosis not present

## 2017-10-25 DIAGNOSIS — H9192 Unspecified hearing loss, left ear: Secondary | ICD-10-CM | POA: Diagnosis not present

## 2017-10-25 DIAGNOSIS — I10 Essential (primary) hypertension: Secondary | ICD-10-CM | POA: Diagnosis not present

## 2017-10-25 DIAGNOSIS — I890 Lymphedema, not elsewhere classified: Secondary | ICD-10-CM | POA: Diagnosis not present

## 2017-10-27 NOTE — Progress Notes (Signed)
Wilmore, [name] ([id number]) Visit Report for 10/25/2017 Arrival Information Details Patient Name: [name], [name] Date of Service: 10/25/2017 2:30 PM Medical Record Number: [id number] Patient Account Number: [id number] Date of Birth/Sex: 01/13/1956 (61 y.o. Female) Treating RN: Ashok Cordia, Debi Primary Care Kris No: Karie Fetch Other Clinician: Referring Tivon Lemoine: Karie Fetch Treating Jacqueli Pangallo/Extender: Rudene Re in Treatment: 3 Visit Information History Since Last Visit All ordered tests and consults were completed: No Patient Arrived: Ambulatory Added or deleted any medications: No Arrival Time: 14:28 Any new allergies or adverse reactions: No Accompanied By: self Had a fall or experienced change in No Transfer Assistance: None activities of daily living that may affect Patient Identification Verified: Yes risk of falls: Secondary Verification Process Yes Signs or symptoms of abuse/neglect since last visito No Completed: Hospitalized since last visit: No Patient Requires Transmission-Based No Has Dressing in Place as Prescribed: Yes Precautions: Has Compression in Place as Prescribed: Yes Patient Has Alerts: Yes Pain Present Now: No Patient Alerts: Patient on Blood Thinner NOT DIABETIC Electronic Signature(s) Signed: 10/25/2017 4:07:41 PM By: Alejandro Mulling Entered By: Alejandro Mulling on 10/25/2017 14:29:09 [name], [name] ([id number]) -------------------------------------------------------------------------------- Compression Therapy Details Patient Name: [name], [name] Date of Service: 10/25/2017 2:30 PM Medical Record Number: [id number] Patient Account Number: [id number] Date of Birth/Sex: 25-Apr-1956 (61 y.o. Female) Treating RN: Ashok Cordia, Debi Primary Care Girtie Wiersma: Karie Fetch Other Clinician: Referring Pina Sirianni: Karie Fetch Treating Malayasia Mirkin/Extender: Rudene Re in Treatment: 3 Compression Therapy Performed for  Wound Assessment: Wound #3 Left,Medial,Anterior Lower Leg Performed By: Jannifer Hick, Debi, RN Compression Type: Three Layer Pre Treatment ABI: 1.2 Post Procedure Diagnosis Same as Pre-procedure Electronic Signature(s) Signed: 10/25/2017 4:07:41 PM By: Alejandro Mulling Entered By: Alejandro Mulling on 10/25/2017 14:50:40 [name], [name] ([id number]) -------------------------------------------------------------------------------- Encounter Discharge Information Details Patient Name: [name] Beam, [name] Date of Service: 10/25/2017 2:30 PM Medical Record Number: [id number] Patient Account Number: [id number] Date of Birth/Sex: 1956-10-23 (61 y.o. Female) Treating RN: Primary Care Edvin Albus: Karie Fetch Other Clinician: Referring Darrell Hauk: Karie Fetch Treating Rafeef Lau/Extender: Rudene Re in Treatment: 3 Encounter Discharge Information Items Discharge Pain Level: 0 Discharge Condition: Stable Ambulatory Status: Ambulatory Discharge Destination: Home Transportation: Private Auto Accompanied By: self Schedule Follow-up Appointment: Yes Medication Reconciliation completed and Yes provided to Patient/Care Farzad Tibbetts: Provided on Clinical Summary of Care: 10/25/2017 Form Type Recipient Paper Patient GR Electronic Signature(s) Signed: 10/25/2017 4:28:19 PM By: Elliot Gurney, BSN, RN, CWS, Kim RN, BSN Entered By: Elliot Gurney, BSN, RN, CWS, Kim on 10/25/2017 15:07:09 [name], [name] ([id number]) -------------------------------------------------------------------------------- Lower Extremity Assessment Details Patient Name: [name], [name] Date of Service: 10/25/2017 2:30 PM Medical Record Number: [id number] Patient Account Number: [id number] Date of Birth/Sex: 06-17-1956 (61 y.o. Female) Treating RN: Ashok Cordia, Debi Primary Care Cecilio Ohlrich: Karie Fetch Other Clinician: Referring Samyia Motter: Karie Fetch Treating Sheron Robin/Extender: Rudene Re in Treatment: 3 Edema  Assessment Assessed: [Left: Yes] [Right: No] Edema: [Left: Ye] [Right: s] Calf Left: Right: Point of Measurement: 30 cm From Medial Instep 52.4 cm cm Ankle Left: Right: Point of Measurement: 10 cm From Medial Instep 22.7 cm cm Vascular Assessment Pulses: Dorsalis Pedis Palpable: [Left:Yes] Posterior Tibial Extremity colors, hair growth, and conditions: Extremity Color: [Left:Hyperpigmented] Hair Growth on Extremity: [Left:Yes] Temperature of Extremity: [Left:Warm] Capillary Refill: [Left:< 3 seconds] Toe Nail Assessment Left: Right: Thick: No Discolored: No Deformed: No Improper Length and Hygiene: No Electronic Signature(s) Signed: 10/25/2017 4:07:41 PM By: Alejandro Mulling Entered By: Alejandro Mulling on 10/25/2017 14:36:05 [name], [name] ([id number]) -------------------------------------------------------------------------------- Multi Wound Chart Details Patient Name: [name] Beam,  [name] Date of Service: 10/25/2017 2:30 PM Medical Record Number: [id number] Patient Account Number: [id number] Date of Birth/Sex: 1956-04-24 (61 y.o. 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] [N/A:N/A] Comorbid History: [3:Middle ear problems, Hypertension, Peripheral Venous Disease, Osteoarthritis] [N/A:N/A] Date Acquired: [3:07/29/2017] [N/A:N/A] Weeks of Treatment: [3:3] [N/A:N/A] Wound Status: [3:Open] [N/A:N/A] Measurements L x W x D [3:2.2x2.4x0.1] [N/A:N/A] (cm) Area (cm) :  [3:4.147] [N/A:N/A] Volume (cm) : [3:0.415] [N/A:N/A] % Reduction in Area: [3:-5.60%] [N/A:N/A] % Reduction in Volume: [3:47.10%] [N/A:N/A] Classification: [3:Full Thickness Without Exposed Support Structures] [N/A:N/A] Exudate Amount: [3:Large] [N/A:N/A] Exudate Type: [3:Serosanguineous] [N/A:N/A] Exudate Color: [3:red, brown] [N/A:N/A] Wound Margin: [3:Flat and Intact] [N/A:N/A] Granulation Amount: [3:Large (67-100%)] [N/A:N/A] Granulation Quality: [3:Red, Hyper-granulation] [N/A:N/A] Necrotic Amount: [3:Small (1-33%)] [N/A:N/A] Exposed Structures: [3:Fat Layer (Subcutaneous Tissue) Exposed: Yes Fascia: No Tendon: No Muscle: No Joint: No Bone: No] [N/A:N/A] Epithelialization: [3:None] [N/A:N/A] Periwound Skin Texture: [3:Induration: Yes Excoriation: No] [N/A:N/A] Callus: No Crepitus: No Rash: No Scarring: No Periwound Skin Moisture: Maceration: No N/A N/A Dry/Scaly: No Periwound Skin Color: Hemosiderin Staining: Yes N/A N/A Atrophie Blanche: No Cyanosis: No Ecchymosis: No Erythema: No Mottled: No Pallor: No Rubor: No Temperature: No Abnormality N/A N/A Tenderness on Palpation: Yes N/A N/A Wound Preparation: Ulcer Cleansing: Other: soap N/A N/A and water Topical Anesthetic Applied: Other: lidocaine 4% Procedures Performed: Compression Therapy N/A N/A Treatment Notes Wound #3 (Left, Medial, Anterior Lower Leg) 1. Cleansed with: Clean wound with Normal Saline Cleanse wound with antibacterial soap and water 2. Anesthetic Topical Lidocaine 4% cream to wound bed prior to debridement 4. Dressing Applied: Prisma Ag 5. Secondary Dressing Applied ABD Pad 7. Secured with 3 Layer Compression System - Left Lower Extremity Notes xtrasorb Electronic Signature(s) Signed: 10/25/2017 3:08:14 PM By: Evlyn Kanner MD, FACS Entered By: Evlyn Kanner on 10/25/2017 15:08:13 [name], [name]  ([id number]) -------------------------------------------------------------------------------- Multi-Disciplinary Care Plan Details Patient Name: [name] Beam, [name] Date of Service: 10/25/2017 2:30 PM Medical Record Number: [id number] Patient Account Number: [id number] Date of Birth/Sex: 08/15/1956 (61 y.o. Female) Treating RN: Ashok Cordia, Debi Primary Care Evyn Kooyman: Karie Fetch Other Clinician: Referring Kayda Allers: Karie Fetch Treating Makaila Windle/Extender: Rudene Re in Treatment: 3 Active Inactive ` Orientation to the Wound Care Program Nursing Diagnoses: Knowledge deficit related to the wound healing center program Goals: Patient/caregiver will verbalize understanding of the Wound Healing Center Program Date Initiated: 10/04/2017 Target Resolution Date: 10/21/2017 Goal Status: Active Interventions: Provide education on orientation to the wound center Notes: ` Venous Leg Ulcer Nursing Diagnoses: Actual venous Insuffiency (use after diagnosis is confirmed) Goals: Patient will maintain optimal edema control Date Initiated: 10/04/2017 Target Resolution Date: 10/21/2017 Goal Status: Active Interventions: Assess peripheral edema status every visit. Treatment Activities: Non-invasive vascular studies : 10/04/2017 Notes: ` Wound/Skin Impairment Nursing Diagnoses: Impaired tissue integrity Goals: Ulcer/skin breakdown will have a volume reduction of 30% by week 4 Date Initiated: 10/04/2017 Target Resolution Date: 11/04/2017 [name], [name] ([id number]) Goal Status: Active Interventions: Assess ulceration(s) every visit Treatment Activities: Skin care regimen initiated : 10/04/2017 Topical wound management initiated : 10/04/2017 Notes: Electronic Signature(s) Signed: 10/25/2017 4:07:41 PM By: Alejandro Mulling Entered By: Alejandro Mulling on 10/25/2017 14:36:11 [name], [name]  (  [id number]) -------------------------------------------------------------------------------- Pain Assessment Details Patient Name: [name], [name] Date of Service: 10/25/2017 2:30 PM Medical Record Number: [id number] Patient Account Number: [id number] Date of Birth/Sex: 1956/01/22 (61 y.o. Female) Treating RN: Ashok CordiaPinkerton, Debi Primary Care Edyth Glomb: Karie FetchAYCOCK, NGWE Other Clinician: Referring Lyrick Worland: Karie FetchAYCOCK, NGWE Treating Shomari Matusik/Extender: Rudene ReBritto, Errol Weeks in Treatment: 3 Active Problems Location of Pain Severity and Description of Pain Patient Has Paino No Site Locations Pain Management and Medication Current Pain Management: Electronic Signature(s) Signed: 10/25/2017 4:07:41 PM By: Alejandro MullingPinkerton, Debra Entered By: Alejandro MullingPinkerton, Debra on 10/25/2017 14:29:19 [name], [name] ([id number]) -------------------------------------------------------------------------------- Patient/Caregiver Education Details Patient Name: Vasbinder, [name] Date of Service: 10/25/2017 2:30 PM Medical Record Number: [id number] Patient Account Number: [id number] Date of Birth/Gender: 1956/01/22 (61 y.o. Female) Treating RN: Huel CoventryWoody, Kim Primary Care Physician: Karie FetchAYCOCK, NGWE Other Clinician: Referring Physician: Karie FetchAYCOCK, NGWE Treating Physician/Extender: Rudene ReBritto, Errol Weeks in Treatment: 3 Education Assessment Education Provided To: Patient Education Topics Provided Venous: Handouts: Controlling Swelling with Compression Stockings Methods: Demonstration, Explain/Verbal Responses: State content correctly Electronic Signature(s) Signed: 10/25/2017 4:28:19 PM By: Elliot GurneyWoody, BSN, RN, CWS, Kim RN, BSN Entered By: Elliot GurneyWoody, BSN, RN, CWS, Kim on 10/25/2017 15:07:23 [name], [name] ([id number]) -------------------------------------------------------------------------------- Wound Assessment Details Patient Name: [name], [name] Date of Service: 10/25/2017 2:30 PM Medical Record Number:  [id number] Patient Account Number: [id number] Date of Birth/Sex: 1956/01/22 (61 y.o. Female) Treating RN: Ashok CordiaPinkerton, Debi Primary Care Keslyn Teater: Karie FetchAYCOCK, NGWE Other Clinician: Referring Ryelee Albee: Karie FetchAYCOCK, NGWE Treating Laurier Jasperson/Extender: Rudene ReBritto, Errol Weeks in Treatment: 3 Wound Status Wound Number: 3 Primary Venous Leg Ulcer Etiology: Wound Location: Left Lower Leg - Medial, Anterior Wound Open Wounding Event: Gradually Appeared Status: Date Acquired: 07/29/2017 Comorbid Middle ear problems, Hypertension, Peripheral Weeks Of Treatment: 3 History: Venous Disease, Osteoarthritis Clustered Wound: No Photos Photo Uploaded By: Alejandro MullingPinkerton, Debra on 10/25/2017 16:04:19 Wound Measurements Length: (cm) 2.2 Width: (cm) 2.4 Depth: (cm) 0.1 Area: (cm) 4.147 Volume: (cm) 0.415 % Reduction in Area: -5.6% % Reduction in Volume: 47.1% Epithelialization: None Tunneling: No Undermining: No Wound Description Full Thickness Without Exposed Support Classification: Structures Wound Margin: Flat and Intact Exudate Large Amount: Exudate Type: Serosanguineous Exudate Color: red, brown Foul Odor After Cleansing: No Slough/Fibrino Yes Wound Bed Granulation Amount: Large (67-100%) Exposed Structure Granulation Quality: Red, Hyper-granulation Fascia Exposed: No Necrotic Amount: Small (1-33%) Fat Layer (Subcutaneous Tissue) Exposed: Yes Necrotic Quality: Adherent Slough Tendon Exposed: No Muscle Exposed: No Joint Exposed: No Bone Exposed: No [name], [name] ([id number]) Periwound Skin Texture Texture Color No Abnormalities Noted: No No Abnormalities Noted: No Callus: No Atrophie Blanche: No Crepitus: No Cyanosis: No Excoriation: No Ecchymosis: No Induration: Yes Erythema: No Rash: No Hemosiderin Staining: Yes Scarring: No Mottled: No Pallor: No Moisture Rubor: No No Abnormalities Noted: No Dry / Scaly: No Temperature / Pain Maceration: No Temperature: No  Abnormality Tenderness on Palpation: Yes Wound Preparation Ulcer Cleansing: Other: soap and water, Topical Anesthetic Applied: Other: lidocaine 4%, Treatment Notes Wound #3 (Left, Medial, Anterior Lower Leg) 1. Cleansed with: Clean wound with Normal Saline Cleanse wound with antibacterial soap and water 2. Anesthetic Topical Lidocaine 4% cream to wound bed prior to debridement 4. Dressing Applied: Prisma Ag 5. Secondary Dressing Applied ABD Pad 7. Secured with 3 Layer Compression System - Left Lower Extremity Notes xtrasorb Electronic Signature(s) Signed: 10/25/2017 4:07:41 PM By: Alejandro MullingPinkerton, Debra Entered By: Alejandro MullingPinkerton, Debra on 10/25/2017 14:34:01 [name], [name] ([id number]) -------------------------------------------------------------------------------- Vitals Details Patient Name: [name] BeamEIMILLER, [name] Date of Service: 10/25/2017 2:30 PM Medical Record Number: [id number] Patient Account Number: [id number] Date  of Birth/Sex: January 24, 1956 (61 y.o. Female) Treating RN: Ashok Cordia, Debi Primary Care Caera Enwright: Karie Fetch Other Clinician: Referring Jerilynn Feldmeier: Karie Fetch Treating Benaiah Behan/Extender: Rudene Re in Treatment: 3 Vital Signs Time Taken: 14:29 Temperature (F): 98.2 Height (in): 60 Pulse (bpm): 60 Weight (lbs): 230 Respiratory Rate (breaths/min): 18 Body Mass Index (BMI): 44.9 Blood Pressure (mmHg): 132/78 Reference Range: 80 - 120 mg / dl Electronic Signature(s) Signed: 10/25/2017 4:07:41 PM By: Alejandro Mulling Entered By: Alejandro Mulling on 10/25/2017 14:32:19

## 2017-10-27 NOTE — Progress Notes (Signed)
Sison, [name] ([id number]) Visit Report for 10/25/2017 Chief Complaint Document Details Patient Name: [name], [name] Date of Service: 10/25/2017 2:30 PM Medical Record Number: [id number] Patient Account Number: [id number] Date of Birth/Sex: Sep 14, 1956 (61 y.o. Female) Treating RN: Primary Care Provider: Karie Fetch Other Clinician: Referring Provider: Karie Fetch Treating Provider/Extender: Rudene Re in Treatment: 3 Information Obtained from: Patient Chief Complaint Patient presents for treatment of an open ulcer due to venous insufficiency which she's had for several months. she is a return patient who we had seen several years ago and has not been able to get appropriate treatment due to the lack of insurance. Electronic Signature(s) Signed: 10/25/2017 3:08:22 PM By: Evlyn Kanner MD, FACS Entered By: Evlyn Kanner on 10/25/2017 15:08:21 Malesky, [name] AMarland Kitchen ([id number]) -------------------------------------------------------------------------------- HPI Details Patient Name: [name], [name] Date of Service: 10/25/2017 2:30 PM Medical Record Number: [id number] Patient Account Number: [id number] Date of Birth/Sex: 1956/06/26 (61 y.o. Female) Treating RN: Primary Care Provider: Karie Fetch Other Clinician: Referring Provider: Karie Fetch Treating Provider/Extender: Rudene Re in Treatment: 3 History of Present Illness Location: swelling of the left lower extremity with ulceration Quality: Patient reports experiencing a dull pain to affected area(s). Severity: Patient states wound are getting worse. Duration: Patient has had the wound for > 3 months prior to seeking treatment at the wound center Timing: Pain in wound is Intermittent (comes and goes Context: The wound would happen gradually Modifying Factors: Other treatment(s) tried include:Neosporin ointment locally Associated Signs and Symptoms: Patient reports having increase discharge. HPI  Description: 61 year old patient who is known to our practice from a previous visit here in 2015 and 2017 for a similar problem. At that stage, she was lost to follow-up, because she did not have insurance and did not complete a venous ablation. I understand she has now obtained insurance and has an appointment to see Dr. Hart Rochester at [facility] vascular in [place], on November 5th. Other than that the patient has had no other issues 10/18/17 on evaluation today patient appears to be doing okay in regard to her left anterior lower extremity wound. She tells me she does have some discomfort but the wraps that we are performing which is a three layer wrap seems to be of benefit for her. She also has an appointment upcoming with Dr. Hart Rochester from November 5 to discuss options for preventing these type of venous ulcers from occurring in the future. No fevers chills noted. ======= Old notes This patient was been previously seen at the wound center is being seen by me for the first time today. 48F with h/o lower extremity swelling presents with a left lower extremity ulcer (first presented to [place] on 07/12/14) . the patient is deaf and lip reads and is fairly conversant. She has had venous ablation in 2015 but due to lack of insurance and money she has been unable to follow-up with the vascular surgeons not she been wearing compression garments. Does not have a past medical history of diabetes or other issues. He has not been wearing compression stockings and we did try to get her Velcro wraps but she is unable to afford the copayment. Addendum: I have found some reports from August 2015 when an arterial study was done and the left lower extremity had triphasic waveform showing no arterial insufficiency. She was also to found to have left greater saphenous vein reflux throughout with tortuous veins and on 08/27/2014 she had a laser ablation done. At a follow-up on September 8 it was  found that she needed a  second procedure as a first procedure was not completely successful. Due to the patient's insurance issues she has not gone back for the second procedure. 03/02/2016 -- he is here with the interpreter today and we have had a thorough discussion about all the above and all questions answered. ========== Electronic Signature(s) Signed: 10/25/2017 3:08:32 PM By: Evlyn Kanner MD, FACS Entered By: Evlyn Kanner on 10/25/2017 15:08:31 Jennette, [name] AMarland Kitchen ([id number]) -------------------------------------------------------------------------------- Physical Exam Details Patient Name: [name], [name] Date of Service: 10/25/2017 2:30 PM Medical Record Number: [id number] Patient Account Number: [id number] Date of Birth/Sex: 1956/01/18 (61 y.o. Female) Treating RN: Primary Care Provider: Karie Fetch Other Clinician: Referring Provider: Karie Fetch Treating Provider/Extender: Rudene Re in Treatment: 3 Constitutional . Pulse regular. Respirations normal and unlabored. Afebrile. . Eyes Nonicteric. Reactive to light. Ears, Nose, Mouth, and Throat Lips, teeth, and gums WNL.Marland Kitchen Moist mucosa without lesions. Neck supple and nontender. No palpable supraclavicular or cervical adenopathy. Normal sized without goiter. Respiratory WNL. No retractions.. Cardiovascular Pedal Pulses WNL. No clubbing, cyanosis or edema. Lymphatic No adneopathy. No adenopathy. No adenopathy. Musculoskeletal Adexa without tenderness or enlargement.. Digits and nails w/o clubbing, cyanosis, infection, petechiae, ischemia, or inflammatory conditions.. Integumentary (Hair, Skin) No suspicious lesions. No crepitus or fluctuance. No peri-wound warmth or erythema. No masses.Marland Kitchen Psychiatric Judgement and insight Intact.. No evidence of depression, anxiety, or agitation.. Notes wound looks clean and no sharp debridement was required. 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 ([id number]) -------------------------------------------------------------------------------- Physician Orders Details Patient Name: [name], [name] Date of Service: 10/25/2017 2:30 PM Medical Record Number: [id number] Patient Account Number: [id number] Date of Birth/Sex: 1956/03/30 (61 y.o. Female) Treating RN: Ashok Cordia, Debi Primary Care Provider: Karie Fetch Other Clinician: Referring Provider: Karie Fetch Treating Provider/Extender: Rudene Re in Treatment: 3 Verbal / Phone Orders: No Diagnosis Coding Wound Cleansing Wound #3 Left,Medial,Anterior Lower Leg o May Shower, gently pat wound dry prior to applying new dressing. Anesthetic Wound #3 Left,Medial,Anterior Lower Leg o Topical Lidocaine 4% cream applied to wound bed prior to debridement Primary Wound Dressing Wound #3 Left,Medial,Anterior Lower Leg o Prisma Ag Secondary Dressing Wound #3 Left,Medial,Anterior Lower Leg o ABD pad o XtraSorb Dressing Change Frequency Wound #3 Left,Medial,Anterior Lower Leg o Change dressing every week Follow-up Appointments Wound #3 Left,Medial,Anterior Lower Leg o Return Appointment in 1 week. o Nurse Visit as needed Edema Control Wound #3 Left,Medial,Anterior Lower Leg o 3 Layer Compression System - Left Lower Extremity Electronic Signature(s) Signed: 10/25/2017 4:21:18 PM By: Evlyn Kanner MD, FACS Entered By: Evlyn Kanner on 10/25/2017 15:09:40 Yarbro, [name] ([id number]) -------------------------------------------------------------------------------- Problem List Details Patient Name: Ainsley, [name] Date of Service: 10/25/2017 2:30 PM Medical Record Number: [id number] Patient Account Number: [id number] Date of Birth/Sex: [birth year] (61 y.o. 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 ([id number]) --------------------------------------------------------------------------------  Progress Note Details Patient Name: Ternes, [name] Date of Service: 10/25/2017 2:30 PM Medical Record Number: [id number] Patient Account Number: [id number] Date of Birth/Sex: May 30, 1956 (61 y.o. Female) Treating RN: Primary Care Provider: Karie FetchAYCOCK, NGWE Other Clinician: Referring Provider: Karie FetchAYCOCK, NGWE Treating Provider/Extender: Rudene ReBritto, Hazelle Woollard Weeks in Treatment: 3 Subjective Chief Complaint Information obtained from Patient Patient presents for treatment of an open ulcer due to venous insufficiency which she's had for several months. she is a return patient who we had seen several years ago and has not been able to get appropriate treatment due to the lack of insurance. History of Present Illness (HPI) The following HPI elements were documented for the patient's wound: Location: swelling of the left lower extremity with ulceration Quality: Patient reports experiencing a dull pain to affected area(s). Severity: Patient states wound are getting worse. Duration: Patient has had the wound for > 3 months prior to seeking treatment at the wound center Timing: Pain in wound is Intermittent (comes and goes Context: The wound would happen  gradually Modifying Factors: Other treatment(s) tried include:Neosporin ointment locally Associated Signs and Symptoms: Patient reports having increase discharge. 61 year old patient who is known to our practice from a previous visit here in 2015 and 2017 for a similar problem. At that stage, she was lost to follow-up, because she did not have insurance and did not complete a venous ablation. I understand she has now obtained insurance and has an appointment to see Dr. Hart RochesterLawson at vein [facility] vascular in [place], on November 5th. Other than that the patient has had no other issues 10/18/17 on evaluation today patient appears to be doing okay in regard to her left anterior lower extremity wound. She tells me she does have some discomfort but the wraps that we are performing which is a three layer wrap seems to be of benefit for her. She also has an appointment upcoming with Dr. Hart RochesterLawson from November 5 to discuss options for preventing these type of venous ulcers from occurring in the future. No fevers chills noted. ======= Old notes This patient was been previously seen at the wound center is being seen by me for the first time today. 53F with h/o lower extremity swelling presents with a left lower extremity ulcer (first presented to us on 07/12/14) . the patient is deaf and lip reads and is fairly conversant. She has had venous ablation in 2015 but due to lack of insurance and money she has been unable to follow-up with the vascular surgeons not she been wearing compression garments. Does not have a past medical history of diabetes or other issues. He has not been wearing compression stockings and we did try to get her Velcro wraps but she is unable to afford the copayment. Addendum: I have found some reports from August 2015 when an arterial study was done and the left lower extremity had triphasic waveform showing no arterial insufficiency. She was also to found to have left greater saphenous  vein reflux throughout with tortuous veins and on 08/27/2014 she had a laser ablation done. At a follow-up on September 8 it was found that she needed a second procedure as a first procedure was not completely successful. Due to the patient's insurance issues she has not gone back for the second procedure. 03/02/2016 -- he is here with the interpreter today and we have had a thorough discussion about all the above and all questions answered. Ashline, [name] ([id number]) ========== Patient History Information obtained from Patient. Family History Cancer, Diabetes -  Mother, Heart Disease - Mother,Father, Hypertension - Mother, Kidney Disease - Mother, Lung Disease - Mother, Stroke - Mother, Thyroid Problems - Mother, No family history of Seizures, Tuberculosis. Social History Former smoker - quit 2005, Marital Status - Divorced, Alcohol Use - Never, Drug Use - No History, Caffeine Use - Daily. Medical History Hospitalization/Surgery History - 06/23/2004, [facility] Removal. Medical And Surgical History Notes Ear/Nose/Mouth/Throat 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) Objective Constitutional Pulse regular. Respirations normal and unlabored. Afebrile. Vitals Time Taken: 2:29 PM, Height: 60 in, Weight: 230 lbs, BMI: 44.9, Temperature: 98.2 F, Pulse: 60 bpm, Respiratory Rate: 18 breaths/min, Blood Pressure: 132/78 mmHg. Eyes Nonicteric. Reactive to light. Ears, Nose, Mouth, and Throat Lips, teeth, and gums WNL.Marland Kitchen Moist mucosa without lesions. Neck supple and nontender. No palpable supraclavicular or cervical adenopathy. Normal sized without goiter. Respiratory WNL. No retractions.. Cardiovascular Pedal Pulses WNL. No clubbing, cyanosis or edema. Lymphatic No adneopathy. No adenopathy. No adenopathy. Jolin, [name] ([id number]) Musculoskeletal Adexa without tenderness or enlargement.. Digits and nails w/o clubbing, cyanosis, infection, petechiae,  ischemia, or inflammatory conditions.Marland Kitchen Psychiatric Judgement and insight Intact.. No evidence of depression, anxiety, or agitation.. General Notes: wound looks clean and no sharp debridement was required. She has significant lymphedema which persists because of her long hours at work Integumentary (Hair, Skin) No suspicious lesions. No crepitus or fluctuance. No peri-wound warmth or erythema. No masses.. Wound #3 status is Open. Original cause of wound was Gradually Appeared. The wound is located on the Left,Medial,Anterior Lower Leg. The wound measures 2.2cm length x 2.4cm width x 0.1cm depth; 4.147cm^2 area and 0.415cm^3 volume. There is Fat Layer (Subcutaneous Tissue) Exposed exposed. There is no tunneling or undermining noted. There is a large amount of serosanguineous drainage noted. The wound margin is flat and intact. There is large (67- 100%) red, hyper - granulation within the wound bed. There is a small (1-33%) amount of necrotic tissue within the wound bed including Adherent Slough. The periwound skin appearance exhibited: Induration, Hemosiderin Staining. The periwound skin appearance did not exhibit: Callus, Crepitus, Excoriation, Rash, Scarring, Dry/Scaly, Maceration, Atrophie Blanche, Cyanosis, Ecchymosis, Mottled, Pallor, Rubor, Erythema. Periwound temperature was noted as No Abnormality. The periwound has tenderness on palpation. 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 There was a Three Layer Compression Therapy Procedure with a pre-treatment ABI of 1.2 by Phillis Haggis, RN. Post procedure Diagnosis Wound #3: Same as  Pre-Procedure Plan Wound Cleansing: Wound #3 Left,Medial,Anterior Lower Leg: May Shower, gently pat wound dry prior to applying new dressing. Bosshart, [name] Anesthetic: Wound #3 Left,Medial,Anterior Lower Leg: Topical Lidocaine 4% cream applied to wound bed prior to debridement Primary Wound Dressing: Wound #3 Left,Medial,Anterior Lower Leg: Prisma Ag Secondary Dressing: Wound #3 Left,Medial,Anterior Lower Leg: ABD pad XtraSorb Dressing Change Frequency: Wound #3 Left,Medial,Anterior Lower Leg: Change dressing every week Follow-up Appointments: Wound #3 Left,Medial,Anterior Lower Leg: Return Appointment in 1 week. Nurse Visit as needed Edema Control: Wound #3 Left,Medial,Anterior Lower Leg: 3 Layer Compression System - Left Lower Extremity I have recommended: 1. silver alginate with a 3 layer Profore 2. Elevation and exercise discussed with her in great detail 3. to keep her appointment  with the vascular surgeon on November 5 at [facility] 4. Regular visits to the wound center Electronic Signature(s) Signed: 10/25/2017 3:09:56 PM By: Evlyn Kanner MD, FACS Entered By: Evlyn Kanner on 10/25/2017 15:09:55 Lager, [name] AMarland Kitchen ([id number]) -------------------------------------------------------------------------------- ROS/PFSH Details Patient Name: Locust, [name] Date of Service: 10/25/2017 2:30 PM Medical Record Number: [id number] Patient Account Number: [id number] Date of Birth/Sex: 1956/09/24 (61 y.o. Female) Treating RN: Primary Care Provider: Karie Fetch Other Clinician: Referring Provider: Karie Fetch Treating Provider/Extender: Rudene Re in Treatment: 3 Information Obtained From Patient Wound History Do you currently have one or more open woundso Yes How many open wounds do you currently haveo 1 Approximately how long have you had your woundso 2 months How have you been treating your wound(s) until nowo neosporin Has your  wound(s) ever healed and then re-openedo Yes Have you had any lab work done in the past montho No Have you tested positive for an antibiotic resistant organism (MRSA, VRE)o No Have you tested positive for osteomyelitis (bone infection)o No Have you had any tests for circulation on your legso Yes Have you had other problems associated with your woundso Infection, Swelling Eyes Medical History: Negative for: Cataracts; Glaucoma; Optic Neuritis Ear/Nose/Mouth/Throat Medical History: Positive for: Middle ear problems Negative for: Chronic sinus problems/congestion Past Medical History Notes: 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) Hematologic/Lymphatic Medical History: Negative for: Anemia; Hemophilia; Human Immunodeficiency Virus; Lymphedema; Sickle Cell Disease Respiratory Medical History: Negative for: Aspiration; Asthma; Chronic Obstructive Pulmonary Disease (COPD); Pneumothorax; Sleep Apnea; Tuberculosis Cardiovascular Medical History: Positive for: Hypertension; Peripheral Venous Disease Negative for: Angina; Arrhythmia; Congestive Heart Failure; Coronary Artery Disease; Deep Vein Thrombosis; Hypotension; Myocardial Infarction; Peripheral Arterial Disease; Vasculitis Gastrointestinal Medical History: Negative for: Cirrhosis ; Colitis; Crohnos; Hepatitis A; Hepatitis B; Hepatitis C Hilario, [name] ([id number]) Endocrine Medical History: Negative for: Type I Diabetes; Type II Diabetes Genitourinary Medical History: Negative for: End Stage Renal Disease Immunological Medical History: Negative for: Lupus Erythematosus; Raynaudos; Scleroderma Integumentary (Skin) Medical History: Negative for: History of Burn; History of pressure wounds Musculoskeletal Medical History: Positive for: Osteoarthritis Negative for: Gout; Rheumatoid Arthritis; Osteomyelitis Neurologic Medical History: Negative for: Dementia; Neuropathy; Quadriplegia; Paraplegia;  Seizure Disorder Oncologic Medical History: Negative for: Received Chemotherapy; Received Radiation Psychiatric Medical History: Negative for: Anorexia/bulimia; Confinement Anxiety HBO Extended History Items Ear/Nose/Mouth/Throat: Middle ear problems Immunizations Pneumococcal Vaccine: Received Pneumococcal Vaccination: No Implantable Devices Hospitalization / Surgery History Name of Hospital Purpose of Hospitalization/Surgery Date [facility] Removal 06/23/2004 Family and Social History Cancer: Yes; Diabetes: Yes - Mother; Heart Disease: Yes - Mother,Father; Hypertension: Yes - Mother; Kidney Disease: Yes - Mother; Lung Disease: Yes - Mother; Seizures: No; Stroke: Yes - Mother; Thyroid Problems: Yes - Mother; Tuberculosis: No; Former smoker - quit 2005; Marital Status - Divorced; Alcohol Use: Never; Drug Use: No History; Caffeine Use: Daily; Plaia, [name] ([id number]) Financial Concerns: Yes; Food, Clothing or Shelter Needs: No; Support System Lacking: No; Transportation Concerns: No; Advanced Directives: No; Patient does not want information on Advanced Directives; Do not resuscitate: No; Living Will: No; Medical Power of Attorney: No Physician Affirmation I have reviewed and agree with the above information. Electronic Signature(s) Signed: 10/25/2017 4:21:18 PM By: Evlyn Kanner MD, FACS Entered By: Evlyn Kanner on 10/25/2017 15:08:40 Casagrande, [name] ([id number]) -------------------------------------------------------------------------------- SuperBill Details Patient Name: Barto, [name] Date of Service: 10/25/2017 Medical Record Number: [id number] Patient Account Number: [id number] Date of Birth/Sex: [birth year] (61 y.o. 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 E66.01 Morbid (severe) obesity due to excess calories Facility Procedures CPT4 Code: [id number] Description: (Facility Use Only) [phone] - APPLY MULTLAY COMPRS LWR LT LEG Modifier: Quantity: 1 Physician Procedures CPT4 Code Description: [id number] - WC PHYS LEVEL 3 - EST PT ICD-10 Diagnosis Description L97.222 Non-pressure chronic ulcer of left calf with fat layer expose E66.01 Morbid (severe) obesity due to excess calories I83.222 Varicose veins of left lower  extremity with both ulcer of cal Modifier: d f and inflammation Quantity: 1 Electronic Signature(s) Signed: 10/25/2017 3:10:22 PM By: Evlyn Kanner MD, FACS Entered By: Evlyn Kanner on 10/25/2017 15:10:21

## 2017-10-28 ENCOUNTER — Ambulatory Visit (HOSPITAL_COMMUNITY)
Admission: RE | Admit: 2017-10-28 | Discharge: 2017-10-28 | Disposition: A | Discharge status: Home / Self Care | Payer: BLUE CROSS/BLUE SHIELD | Source: Ambulatory Visit | Attending: Surgery | Admitting: Surgery

## 2017-10-28 ENCOUNTER — Encounter: Payer: Self-pay | Admitting: Vascular Surgery

## 2017-10-28 ENCOUNTER — Ambulatory Visit: Payer: BLUE CROSS/BLUE SHIELD | Admitting: Vascular Surgery

## 2017-10-28 VITALS — BP 137/86 | HR 64 | Temp 97.4°F | Resp 14 | Ht 61.0 in | Wt 230.0 lb

## 2017-10-28 DIAGNOSIS — L97929 Non-pressure chronic ulcer of unspecified part of left lower leg with unspecified severity: Secondary | ICD-10-CM | POA: Diagnosis not present

## 2017-10-28 DIAGNOSIS — I83029 Varicose veins of left lower extremity with ulcer of unspecified site: Secondary | ICD-10-CM | POA: Diagnosis not present

## 2017-10-28 DIAGNOSIS — I83892 Varicose veins of left lower extremities with other complications: Secondary | ICD-10-CM | POA: Diagnosis not present

## 2017-10-28 NOTE — Progress Notes (Signed)
Subjective:     Patient ID: [name], female   DOB: 09-14-1956, 61 y.o.   MRN: [id number]  HPI This 61 year old female was referred by Dr. Letta Pate for evaluation of venous stasis ulcer in the left leg. This patient has had at least 3 venous stasis ulcers lower third left leg over the past 4 years. Its unclear whether there was ever complete healing of the ulcer. She was evaluated in the vein center in [facility] and and had laser ablation of the left great saphenous vein about 3 years ago. This was unsuccessful. Patient developed recurrent ulcer about 3 months ago. She has been going to the wound center for the past 1 month. She has no history of DVT thrombophlebitis or bleeding. She does not elastic compression stockings because of the size of her legs and difficulty getting these on.  Past Medical History:  Diagnosis Date  . Injury, blood vessel     Social History   Tobacco Use  . Smoking status: Never Smoker  . Smokeless tobacco: Never Used  Substance Use Topics  . Alcohol use: No    No family history on file.  Allergies  Allergen Reactions  . Codeine   . Septra Ds [Sulfamethoxazole-Trimethoprim] Other (See Comments)    Nausea   And stomach burning  . Tramadol Other (See Comments)    Makes me feel loopy       Current Outpatient Medications:  .  acetaminophen (TYLENOL) 325 MG tablet, Take 650 mg by mouth every 6 (six) hours as needed., Disp: , Rfl:  .  doxycycline (VIBRAMYCIN) 100 MG capsule, Take 1 capsule (100 mg total) by mouth 2 (two) times daily. (Patient not taking: Reported on 10/28/2017), Disp: 20 capsule, Rfl: 0 .  metaxalone (SKELAXIN) 800 MG tablet, Take 1 tablet (800 mg total) by mouth 3 (three) times daily. Used for muscle spasm (Patient not taking: Reported on 10/28/2017), Disp: 21 tablet, Rfl: 0 .  mupirocin ointment (BACTROBAN) 2 %, Apply 1 application topically 3 (three) times daily. (Patient not taking: Reported on 10/28/2017), Disp: 22 g,  Rfl: 0 .  naproxen (NAPROSYN) 500 MG tablet, Take 1 tablet (500 mg total) by mouth 2 (two) times daily with a meal. (Patient not taking: Reported on 10/28/2017), Disp: 60 tablet, Rfl: 0 .  naproxen (NAPROSYN) 500 MG tablet, Take 1 tablet (500 mg total) by mouth 2 (two) times daily. (Patient not taking: Reported on 10/28/2017), Disp: 30 tablet, Rfl: 0 .  ondansetron (ZOFRAN ODT) 8 MG disintegrating tablet, Take 1 tablet (8 mg total) by mouth every 8 (eight) hours as needed. (Patient not taking: Reported on 10/28/2017), Disp: 6 tablet, Rfl: 0 .  traMADol (ULTRAM) 50 MG tablet, Take 1 tablet (50 mg total) by mouth every 6 (six) hours as needed. (Patient not taking: Reported on 10/28/2017), Disp: 20 tablet, Rfl: 0  Vitals:   10/28/17 1453 10/28/17 1455  BP: (!) 144/84 137/86  Pulse: 64 64  Resp: 14   Temp: (!) 97.4 F (36.3 C)   SpO2: 99%   Weight: 230 lb (104.3 kg)   Height: 5\' 1"  (1.549 m)     Body mass index is 43.46 kg/m.         Review of Systems Denies chest pain, dyspnea on exertion, PND, orthopnea, hemoptysis, coronary artery disease, cerebrovascular disease, hypertension. Does have chronic obesity    Objective:   Physical Exam BP 137/86 (BP Location: Left Arm, Patient Position: Sitting, Cuff Size: Large)   Pulse  64   Temp (!) 97.4 F (36.3 C)   Resp 14   Ht 5\' 1"  (1.549 m)   Wt 230 lb (104.3 kg)   SpO2 99%   BMI 43.46 kg/m     Gen.-alert and oriented x3 in no apparent distress HEENT normal for age Lungs no rhonchi or wheezing Cardiovascular regular rhythm no murmurs carotid pulses 3+ palpable no bruits audible Abdomen soft nontender no palpable masses-obese Musculoskeletal free of  major deformities Skin clear -no rashes Neurologic normal Lower extremities 3+ femoral and dorsalis pedis pulses palpable bilaterally with no edema on the right 1+ edema on the left Severe hyperpigmentation with lipo dermato sclerosis lower third left leg with round typical venous  stasis ulcer measuring 3 cm in diameter she's uninfected. This is about 8-10 cm proximal to the medial malleolus on the left. No bulging varicosities noted.  Today I ordered a venous duplex exam the left leg which I reviewed and interpreted. Also performed a bedside SonoSite ultrasound exam to confirm the findings. Patient has a large caliber left great saphenous vein which has rapidly gross reflux throughout identified from the proximal calf to the saphenofemoral junction. There is also a large caliber left small saphenous vein       Assessment:     Recurrent venous stasis ulcer left leg now present for the past 4 months and has been attending wound clinic for 1 month with no success in healing. Patient with large caliber left great saphenous vein with gross reflux throughout Chronic obesity    Plan:     Patient needs laser ablation left great saphenous vein from the proximal calf to near the saphenofemoral junction as soon as possible to hopefully heal this ulceration which is been present for at least 4 months and is not improving with conservative management. We should wave the three-month waiting. Because of this significant ulceration which needs immediate treatment I have explained to patient that it is possible that because of large caliber of the vein and rapid flow that this will not closed with the laser but it is certainly the best option to try initially. She understands this We will proceed with precertification to perform this in the near future Patient is not a candidate for long leg elastic compression stockings 20-30 millimeter gradient because of her obesity.

## 2017-10-28 NOTE — Progress Notes (Signed)
Vitals:   10/28/17 1453  BP: (!) 144/84  Pulse: 64  Resp: 14  Temp: (!) 97.4 F (36.3 C)  SpO2: 99%  Weight: 230 lb (104.3 kg)  Height: 5\' 1"  (1.549 m)

## 2017-10-29 DIAGNOSIS — I83222 Varicose veins of left lower extremity with both ulcer of calf and inflammation: Secondary | ICD-10-CM | POA: Diagnosis not present

## 2017-10-30 NOTE — Progress Notes (Signed)
[name], [name] ([id number]) Visit Report for 10/29/2017 Arrival Information Details Patient Name: [name], [name] Date of Service: 10/29/2017 2:15 PM Medical Record Number: [id number] Patient Account Number: [id number] Date of Birth/Sex: [birth year] (61 y.o. Female) Treating RN: Curtis Sitesorthy, Joanna Primary Care Tamieka Rancourt: Karie FetchAYCOCK, NGWE Other Clinician: Referring Khylah Kendra: Karie FetchAYCOCK, NGWE Treating Linnette Panella/Extender: Altamese CarolinaOBSON, MICHAEL G Weeks in Treatment: 3 Visit Information History Since Last Visit Added or deleted any medications: No Patient Arrived: Ambulatory Any new allergies or adverse reactions: No Arrival Time: 14:29 Had a fall or experienced change in No Accompanied By: self activities of daily living that may affect Transfer Assistance: None risk of falls: Patient Identification Verified: Yes Signs or symptoms of abuse/neglect since last visito No Secondary Verification Process Yes Hospitalized since last visit: No Completed: Has Dressing in Place as Prescribed: Yes Patient Requires Transmission-Based No Has Compression in Place as Prescribed: No Precautions: Pain Present Now: No Patient Has Alerts: Yes Patient Alerts: Patient on Blood Thinner NOT DIABETIC Electronic Signature(s) Signed: 10/29/2017 4:46:00 PM By: Curtis Sitesorthy, Joanna Entered By: Curtis Sitesorthy, Joanna on 10/29/2017 14:42:46 [name], [name] ([id number]) -------------------------------------------------------------------------------- Encounter Discharge Information Details Patient Name: [name], [name] Date of Service: 10/29/2017 2:15 PM Medical Record Number: [id number] Patient Account Number: [id number] Date of Birth/Sex: [birth year] (61 y.o. Female) Treating RN: Curtis Sitesorthy, Joanna Primary Care Tsuyako Jolley: Karie FetchAYCOCK, NGWE Other Clinician: Referring Manolito Jurewicz: Karie FetchAYCOCK, NGWE Treating Dina Warbington/Extender: Altamese CarolinaOBSON, MICHAEL G Weeks in Treatment: 3 Encounter Discharge Information Items Discharge Pain Level: 0 Discharge  Condition: Stable Ambulatory Status: Ambulatory Discharge Destination: Home Private Transportation: Auto Accompanied By: self Schedule Follow-up Appointment: Yes Medication Reconciliation completed and No provided to Patient/Care Husayn Reim: Clinical Summary of Care: Electronic Signature(s) Signed: 10/29/2017 4:46:00 PM By: Curtis Sitesorthy, Joanna Entered By: Curtis Sitesorthy, Joanna on 10/29/2017 14:44:05 [name] ([id number]) -------------------------------------------------------------------------------- Patient/Caregiver Education Details Patient Name: [name], [name] Date of Service: 10/29/2017 2:15 PM Medical Record Number: [id number] Patient Account Number: [id number] Date of Birth/Gender: [birth year] (61 y.o. Female) Treating RN: Curtis Sitesorthy, Joanna Primary Care Physician: Karie FetchAYCOCK, NGWE Other Clinician: Referring Physician: Karie FetchAYCOCK, NGWE Treating Physician/Extender: Altamese CarolinaOBSON, MICHAEL G Weeks in Treatment: 3 Education Assessment Education Provided To: Patient Education Topics Provided Venous: Handouts: Other: leg elevation Methods: Explain/Verbal Responses: State content correctly Electronic Signature(s) Signed: 10/29/2017 4:46:00 PM By: Curtis Sitesorthy, Joanna Entered By: Curtis Sitesorthy, Joanna on 10/29/2017 14:43:53 [name], [name] ([id number]) -------------------------------------------------------------------------------- Wound Assessment Details Patient Name: [name], [name] Date of Service: 10/29/2017 2:15 PM Medical Record Number: [id number] Patient Account Number: [id number] Date of Birth/Sex: [birth year] (61 y.o. Female) Treating RN: Curtis Sitesorthy, Joanna Primary Care Millisa Giarrusso: Karie FetchAYCOCK, NGWE Other Clinician: Referring Toshiye Kever: Karie FetchAYCOCK, NGWE Treating Hydee Fleece/Extender: Altamese CarolinaOBSON, MICHAEL G Weeks in Treatment: 3 Wound Status Wound Number: 3 Primary Venous Leg Ulcer Etiology: Wound Location: Left Lower Leg - Medial, Anterior Wound Open Wounding Event: Gradually Appeared Status: Date  Acquired: 07/29/2017 Comorbid Middle ear problems, Hypertension, Weeks Of Treatment: 3 History: Peripheral Venous Disease, Osteoarthritis Clustered Wound: No Photos Photo Uploaded By: Curtis Sitesorthy, Joanna on 10/29/2017 16:37:38 Wound Measurements Length: (cm) 2.4 Width: (cm) 2.5 Depth: (cm) 0.1 Area: (cm) 4.712 Volume: (cm) 0.471 % Reduction in Area: -20% % Reduction in Volume: 40% Epithelialization: None Tunneling: No Undermining: No Wound Description Full Thickness Without Exposed Support Classification: Structures Wound Margin: Flat and Intact Exudate Large Amount: Exudate Type: Serosanguineous Exudate Color: red, brown Foul Odor After Cleansing: No Slough/Fibrino Yes Wound Bed Granulation Amount: Large (67-100%) Exposed Structure Granulation Quality: Red, Hyper-granulation Fascia Exposed: No Necrotic Amount: Small (1-33%) Fat Layer (Subcutaneous Tissue) Exposed: Yes Necrotic Quality: Adherent Slough  Tendon Exposed: No Muscle Exposed: No Joint Exposed: No Bone Exposed: No [name], [name] ([id number]) Periwound Skin Texture Texture Color No Abnormalities Noted: No No Abnormalities Noted: No Callus: No Atrophie Blanche: No Crepitus: No Cyanosis: No Excoriation: No Ecchymosis: No Induration: Yes Erythema: No Rash: No Hemosiderin Staining: Yes Scarring: No Mottled: No Pallor: No Moisture Rubor: No No Abnormalities Noted: No Dry / Scaly: No Temperature / Pain Maceration: No Temperature: No Abnormality Tenderness on Palpation: Yes Wound Preparation Ulcer Cleansing: Other: soap and water, Topical Anesthetic Applied: Other: lidocaine 4%, Treatment Notes Wound #3 (Left, Medial, Anterior Lower Leg) 1. Cleansed with: Clean wound with Normal Saline 4. Dressing Applied: Prisma Ag Other dressing (specify in notes) 5. Secondary Dressing Applied ABD Pad 7. Secured with 3 Layer Compression System - Left Lower Extremity Notes xtrasorb Electronic  Signature(s) Signed: 10/29/2017 4:46:00 PM By: Curtis Sitesorthy, Joanna Entered By: Curtis Sitesorthy, Joanna on 10/29/2017 14:43:06

## 2017-11-01 ENCOUNTER — Encounter: Payer: BLUE CROSS/BLUE SHIELD | Admitting: Surgery

## 2017-11-01 DIAGNOSIS — I83222 Varicose veins of left lower extremity with both ulcer of calf and inflammation: Secondary | ICD-10-CM | POA: Diagnosis not present

## 2017-11-03 NOTE — Progress Notes (Signed)
Nong, [name] ([id number]) Visit Report for 11/01/2017 Arrival Information Details Patient Name: Slovacek, [name] Date of Service: 11/01/2017 1:15 PM Medical Record Number: [id number] Patient Account Number: [id number] Date of Birth/Sex: June 24, 1956 (61 y.o. Female) Treating RN: Huel Coventry Primary Care Gavan Nordby: Karie Fetch Other Clinician: Referring Jerrad Mendibles: Karie Fetch Treating Layonna Dobie/Extender: Rudene Re in Treatment: 4 Visit Information History Since Last Visit Added or deleted any medications: No Patient Arrived: Ambulatory Any new allergies or adverse reactions: No Arrival Time: 13:33 Had a fall or experienced change in No Accompanied By: self activities of daily living that may affect Transfer Assistance: None risk of falls: Patient Identification Verified: Yes Signs or symptoms of abuse/neglect since last visito No Secondary Verification Process Yes Hospitalized since last visit: No Completed: Has Dressing in Place as Prescribed: Yes Patient Requires Transmission-Based No Has Compression in Place as Prescribed: Yes Precautions: Pain Present Now: No Patient Has Alerts: Yes Patient Alerts: Patient on Blood Thinner NOT DIABETIC Electronic Signature(s) Signed: 11/01/2017 5:10:23 PM By: Elliot Gurney, BSN, RN, CWS, Kim RN, BSN Entered By: Elliot Gurney, BSN, RN, CWS, Kim on 11/01/2017 13:34:00 Eckstrom, [name] ([id number]) -------------------------------------------------------------------------------- Clinic Level of Care Assessment Details Patient Name: Clairmont, [name] Date of Service: 11/01/2017 1:15 PM Medical Record Number: [id number] Patient Account Number: [id number] Date of Birth/Sex: [birth year] (61 y.o. Female) Treating RN: Huel Coventry Primary Care Zyden Suman: Karie Fetch Other Clinician: Referring Dacoda Finlay: Karie Fetch Treating Jilliam Bellmore/Extender: Rudene Re in Treatment: 4 Clinic Level of Care Assessment Items TOOL 4 Quantity  Score []  - Use when only an EandM is performed on FOLLOW-UP visit 0 ASSESSMENTS - Nursing Assessment / Reassessment []  - Reassessment of Co-morbidities (includes updates in patient status) 0 X- 1 5 Reassessment of Adherence to Treatment Plan ASSESSMENTS - Wound and Skin Assessment / Reassessment X - Simple Wound Assessment / Reassessment - one wound 1 5 []  - 0 Complex Wound Assessment / Reassessment - multiple wounds []  - 0 Dermatologic / Skin Assessment (not related to wound area) ASSESSMENTS - Focused Assessment []  - Circumferential Edema Measurements - multi extremities 0 []  - 0 Nutritional Assessment / Counseling / Intervention []  - 0 Lower Extremity Assessment (monofilament, tuning fork, pulses) []  - 0 Peripheral Arterial Disease Assessment (using hand held doppler) ASSESSMENTS - Ostomy and/or Continence Assessment and Care []  - Incontinence Assessment and Management 0 []  - 0 Ostomy Care Assessment and Management (repouching, etc.) PROCESS - Coordination of Care X - Simple Patient / Family Education for ongoing care 1 15 []  - 0 Complex (extensive) Patient / Family Education for ongoing care []  - 0 Staff obtains Chiropractor, Records, Test Results / Process Orders []  - 0 Staff telephones HHA, Nursing Homes / Clarify orders / etc []  - 0 Routine Transfer to another Facility (non-emergent condition) []  - 0 Routine Hospital Admission (non-emergent condition) []  - 0 New Admissions / Manufacturing engineer / Ordering NPWT, Apligraf, etc. []  - 0 Emergency Hospital Admission (emergent condition) X- 1 10 Simple Discharge Coordination Trunnell, [name] ([id number]) []  - 0 Complex (extensive) Discharge Coordination PROCESS - Special Needs []  - Pediatric / Minor Patient Management 0 []  - 0 Isolation Patient Management []  - 0 Hearing / Language / Visual special needs []  - 0 Assessment of Community assistance (transportation, D/C planning, etc.) []  - 0 Additional  assistance / Altered mentation []  - 0 Support Surface(s) Assessment (bed, cushion, seat, etc.) INTERVENTIONS - Wound Cleansing / Measurement X - Simple Wound Cleansing - one wound 1 5 []  - 0 Complex  Wound Cleansing - multiple wounds X- 1 5 Wound Imaging (photographs - any number of wounds) X- 1 5 Wound Tracing (instead of photographs) X- 1 5 Simple Wound Measurement - one wound []  - 0 Complex Wound Measurement - multiple wounds INTERVENTIONS - Wound Dressings []  - Small Wound Dressing one or multiple wounds 0 X- 1 15 Medium Wound Dressing one or multiple wounds []  - 0 Large Wound Dressing one or multiple wounds X- 1 5 Application of Medications - topical []  - 0 Application of Medications - injection INTERVENTIONS - Miscellaneous []  - External ear exam 0 []  - 0 Specimen Collection (cultures, biopsies, blood, body fluids, etc.) []  - 0 Specimen(s) / Culture(s) sent or taken to Lab for analysis []  - 0 Patient Transfer (multiple staff / Nurse, adultHoyer Lift / Similar devices) []  - 0 Simple Staple / Suture removal (25 or less) []  - 0 Complex Staple / Suture removal (26 or more) []  - 0 Hypo / Hyperglycemic Management (close monitor of Blood Glucose) []  - 0 Ankle / Brachial Index (ABI) - do not check if billed separately X- 1 5 Vital Signs Drag[place], [name] ([id number]) Has the patient been seen at the hospital within the last three years: Yes Total Score: 80 Level Of Care: New/Established - Level 3 Electronic Signature(s) Signed: 11/01/2017 5:10:23 PM By: Elliot GurneyWoody, BSN, RN, CWS, Kim RN, BSN Entered By: Elliot GurneyWoody, BSN, RN, CWS, Kim on 11/01/2017 14:12:29 [place]ner, [name] ([id number]) -------------------------------------------------------------------------------- Encounter Discharge Information Details Patient Name: Sesma, [name] Date of Service: 11/01/2017 1:15 PM Medical Record Number: [id number] Patient Account Number: [id number] Date of Birth/Sex: October 27, 1956 (61 y.o.  Female) Treating RN: Primary Care Patirica Longshore: Karie FetchAYCOCK, NGWE Other Clinician: Referring Shyah Cadmus: Karie FetchAYCOCK, NGWE Treating Lanise Mergen/Extender: Rudene ReBritto, Errol Weeks in Treatment: 4 Encounter Discharge Information Items Discharge Pain Level: 0 Discharge Condition: Stable Ambulatory Status: Ambulatory Discharge Destination: Home Transportation: Private Auto Accompanied By: self Schedule Follow-up Appointment: Yes Medication Reconciliation completed and No provided to Patient/Care Maliaka Brasington: Provided on Clinical Summary of Care: 11/01/2017 Form Type Recipient Paper Patient GR Electronic Signature(s) Signed: 11/01/2017 5:10:23 PM By: Elliot GurneyWoody, BSN, RN, CWS, Kim RN, BSN Entered By: Elliot GurneyWoody, BSN, RN, CWS, Kim on 11/01/2017 14:14:35 Schwebke, [name] ([id number]) -------------------------------------------------------------------------------- Lower Extremity Assessment Details Patient Name: Glaude, [name] Date of Service: 11/01/2017 1:15 PM Medical Record Number: [id number] Patient Account Number: [id number] Date of Birth/Sex: October 27, 1956 (61 y.o. Female) Treating RN: Huel CoventryWoody, Kim Primary Care Emmaclaire Switala: Karie FetchAYCOCK, NGWE Other Clinician: Referring Yulianna Folse: Karie FetchAYCOCK, NGWE Treating Geana Walts/Extender: Rudene ReBritto, Errol Weeks in Treatment: 4 Edema Assessment Assessed: [Left: No] [Right: No] [Left: Edema] [Right: :] Calf Left: Right: Point of Measurement: 30 cm From Medial Instep 53 cm cm Ankle Left: Right: Point of Measurement: 10 cm From Medial Instep 23.4 cm cm Vascular Assessment Pulses: Dorsalis Pedis Palpable: [Left:Yes] Posterior Tibial Extremity colors, hair growth, and conditions: Extremity Color: [Left:Hyperpigmented] Hair Growth on Extremity: [Left:No] Temperature of Extremity: [Left:Warm] Capillary Refill: [Left:< 3 seconds] Toe Nail Assessment Left: Right: Thick: No Discolored: No Deformed: No Improper Length and Hygiene: No Electronic Signature(s) Signed: 11/01/2017 5:10:23 PM  By: Elliot GurneyWoody, BSN, RN, CWS, Kim RN, BSN Entered By: Elliot GurneyWoody, BSN, RN, CWS, Kim on 11/01/2017 13:42:33 Avitabile, [name] ([id number]) -------------------------------------------------------------------------------- Multi Wound Chart Details Patient Name: [name], [name] Date of Service: 11/01/2017 1:15 PM Medical Record Number: [id number] Patient Account Number: [id number] Date of Birth/Sex: October 27, 1956 (61 y.o. Female) Treating RN: Huel CoventryWoody, Kim Primary Care Jamia Hoban: Karie FetchAYCOCK, NGWE Other Clinician: Referring Abdulmalik Darco: Karie FetchAYCOCK, NGWE Treating Xion Debruyne/Extender: Rudene ReBritto, Errol Weeks in  Treatment: 4 Vital Signs Height(in): 60 Pulse(bpm): 59 Weight(lbs): 230 Blood Pressure(mmHg): 174/73 Body Mass Index(BMI): 45 Temperature(F): 98 Respiratory Rate 16 (breaths/min): Photos: [N/A:N/A] Wound Location: Left Lower Leg - Medial, N/A N/A Anterior Wounding Event: Gradually Appeared N/A N/A Primary Etiology: Venous Leg Ulcer N/A N/A Comorbid History: Middle ear problems, N/A N/A Hypertension, Peripheral Venous Disease, Osteoarthritis Date Acquired: 07/29/2017 N/A N/A Weeks of Treatment: 4 N/A N/A Wound Status: Open N/A N/A Measurements L x W x D 2.4x2.4x0.1 N/A N/A (cm) Area (cm) : 4.524 N/A N/A Volume (cm) : 0.452 N/A N/A % Reduction in Area: -15.20% N/A N/A % Reduction in Volume: 42.40% N/A N/A Classification: Full Thickness Without N/A N/A Exposed Support Structures Exudate Amount: Large N/A N/A Exudate Type: Serosanguineous N/A N/A Exudate Color: red, brown N/A N/A Wound Margin: Flat and Intact N/A N/A Granulation Amount: Large (67-100%) N/A N/A Granulation Quality: Red, Hyper-granulation N/A N/A Necrotic Amount: Small (1-33%) N/A N/A Exposed Structures: Fat Layer (Subcutaneous N/A N/A Tissue) Exposed: Yes Fascia: No Tendon: No Muscle: No Harrel, [name] ([id number]) Joint: No Bone: No Epithelialization: None N/A N/A Periwound Skin Texture: Induration: Yes N/A  N/A Excoriation: No Callus: No Crepitus: No Rash: No Scarring: No Periwound Skin Moisture: Maceration: No N/A N/A Dry/Scaly: No Periwound Skin Color: Hemosiderin Staining: Yes N/A N/A Atrophie Blanche: No Cyanosis: No Ecchymosis: No Erythema: No Mottled: No Pallor: No Rubor: No Temperature: No Abnormality N/A N/A Tenderness on Palpation: Yes N/A N/A Wound Preparation: Ulcer Cleansing: Other: soap N/A N/A and water Topical Anesthetic Applied: Other: lidocaine 4% Treatment Notes Wound #3 (Left, Medial, Anterior Lower Leg) 1. Cleansed with: Clean wound with Normal Saline 2. Anesthetic Topical Lidocaine 4% cream to wound bed prior to debridement 4. Dressing Applied: Other dressing (specify in notes) 5. Secondary Dressing Applied ABD Pad Gauze and Kerlix/Conform Electronic Signature(s) Signed: 11/01/2017 2:25:03 PM By: Evlyn Kanner MD, FACS Entered By: Evlyn Kanner on 11/01/2017 14:25:03 Berman, [name] ([id number]) -------------------------------------------------------------------------------- Multi-Disciplinary Care Plan Details Patient Name: Wieser, [name] Date of Service: 11/01/2017 1:15 PM Medical Record Number: [id number] Patient Account Number: [id number] Date of Birth/Sex: [birth year] (61 y.o. Female) Treating RN: Huel Coventry Primary Care Jemal Miskell: Karie Fetch Other Clinician: Referring Heith Haigler: Karie Fetch Treating Nikayla Madaris/Extender: Rudene Re in Treatment: 4 Active Inactive ` Orientation to the Wound Care Program Nursing Diagnoses: Knowledge deficit related to the wound healing center program Goals: Patient/caregiver will verbalize understanding of the Wound Healing Center Program Date Initiated: 10/04/2017 Target Resolution Date: 10/21/2017 Goal Status: Active Interventions: Provide education on orientation to the wound center Notes: ` Venous Leg Ulcer Nursing Diagnoses: Actual venous Insuffiency (use after diagnosis is  confirmed) Goals: Patient will maintain optimal edema control Date Initiated: 10/04/2017 Target Resolution Date: 10/21/2017 Goal Status: Active Interventions: Assess peripheral edema status every visit. Treatment Activities: Non-invasive vascular studies : 10/04/2017 Notes: ` Wound/Skin Impairment Nursing Diagnoses: Impaired tissue integrity Goals: Ulcer/skin breakdown will have a volume reduction of 30% by week 4 Date Initiated: 10/04/2017 Target Resolution Date: 11/04/2017 Pamer, [name] ([id number]) Goal Status: Active Interventions: Assess ulceration(s) every visit Treatment Activities: Skin care regimen initiated : 10/04/2017 Topical wound management initiated : 10/04/2017 Notes: Electronic Signature(s) Signed: 11/01/2017 5:10:23 PM By: Elliot Gurney, BSN, RN, CWS, Kim RN, BSN Entered By: Elliot Gurney, BSN, RN, CWS, Kim on 11/01/2017 13:42:38 Cory, [name] ([id number]) -------------------------------------------------------------------------------- Pain Assessment Details Patient Name: [name], [name] Date of Service: 11/01/2017 1:15 PM Medical Record Number: [id number] Patient Account Number: [id number] Date of Birth/Sex: 04-05-1956 (61 y.o. Female)  Treating RN: Huel Coventry Primary Care Alaysiah Browder: Karie Fetch Other Clinician: Referring Tyshia Fenter: Karie Fetch Treating Melenie Minniear/Extender: Rudene Re in Treatment: 4 Active Problems Location of Pain Severity and Description of Pain Patient Has Paino No Site Locations With Dressing Change: No Pain Management and Medication Current Pain Management: Electronic Signature(s) Signed: 11/01/2017 5:10:23 PM By: Elliot Gurney, BSN, RN, CWS, Kim RN, BSN Entered By: Elliot Gurney, BSN, RN, CWS, Kim on 11/01/2017 13:34:14 Vilchis, [name] ([id number]) -------------------------------------------------------------------------------- Patient/Caregiver Education Details Patient Name: Kavan, [name] Date of Service:  11/01/2017 1:15 PM Medical Record Number: [id number] Patient Account Number: [id number] Date of Birth/Gender: 06-13-1956 (61 y.o. Female) Treating RN: Huel Coventry Primary Care Physician: Karie Fetch Other Clinician: Referring Physician: Karie Fetch Treating Physician/Extender: Rudene Re in Treatment: 4 Education Assessment Education Provided To: Patient Education Topics Provided Wound/Skin Impairment: Handouts: Caring for Your Ulcer Methods: Explain/Verbal Responses: State content correctly Electronic Signature(s) Signed: 11/01/2017 5:10:23 PM By: Elliot Gurney, BSN, RN, CWS, Kim RN, BSN Entered By: Elliot Gurney, BSN, RN, CWS, Kim on 11/01/2017 14:14:55 Manz, [name] ([id number]) -------------------------------------------------------------------------------- Wound Assessment Details Patient Name: Kalan, [name] Date of Service: 11/01/2017 1:15 PM Medical Record Number: [id number] Patient Account Number: [id number] Date of Birth/Sex: 1956/10/09 (61 y.o. Female) Treating RN: Huel Coventry Primary Care Sharnika Binney: Karie Fetch Other Clinician: Referring Emalee Knies: Karie Fetch Treating Yaileen Hofferber/Extender: Rudene Re in Treatment: 4 Wound Status Wound Number: 3 Primary Venous Leg Ulcer Etiology: Wound Location: Left Lower Leg - Medial, Anterior Wound Open Wounding Event: Gradually Appeared Status: Date Acquired: 07/29/2017 Comorbid Middle ear problems, Hypertension, Peripheral Weeks Of Treatment: 4 History: Venous Disease, Osteoarthritis Clustered Wound: No Photos Wound Measurements Length: (cm) 2.4 Width: (cm) 2.4 Depth: (cm) 0.1 Area: (cm) 4.524 Volume: (cm) 0.452 % Reduction in Area: -15.2% % Reduction in Volume: 42.4% Epithelialization: None Tunneling: No Undermining: No Wound Description Full Thickness Without Exposed Support Classification: Structures Wound Margin: Flat and Intact Exudate Large Amount: Exudate Type: Serosanguineous Exudate  Color: red, brown Foul Odor After Cleansing: No Slough/Fibrino Yes Wound Bed Granulation Amount: Large (67-100%) Exposed Structure Granulation Quality: Red, Hyper-granulation Fascia Exposed: No Necrotic Amount: Small (1-33%) Fat Layer (Subcutaneous Tissue) Exposed: Yes Necrotic Quality: Adherent Slough Tendon Exposed: No Muscle Exposed: No Joint Exposed: No Bone Exposed: No Periwound Skin Texture Texture Color No Abnormalities Noted: No No Abnormalities Noted: No Belasco, [name] ([id number]) Callus: No Atrophie Blanche: No Crepitus: No Cyanosis: No Excoriation: No Ecchymosis: No Induration: Yes Erythema: No Rash: No Hemosiderin Staining: Yes Scarring: No Mottled: No Pallor: No Moisture Rubor: No No Abnormalities Noted: No Dry / Scaly: No Temperature / Pain Maceration: No Temperature: No Abnormality Tenderness on Palpation: Yes Wound Preparation Ulcer Cleansing: Other: soap and water, Topical Anesthetic Applied: Other: lidocaine 4%, Treatment Notes Wound #3 (Left, Medial, Anterior Lower Leg) 1. Cleansed with: Clean wound with Normal Saline 2. Anesthetic Topical Lidocaine 4% cream to wound bed prior to debridement 4. Dressing Applied: Other dressing (specify in notes) 5. Secondary Dressing Applied ABD Pad Gauze and Kerlix/Conform Electronic Signature(s) Signed: 11/01/2017 5:10:23 PM By: Elliot Gurney, BSN, RN, CWS, Kim RN, BSN Entered By: Elliot Gurney, BSN, RN, CWS, Kim on 11/01/2017 13:39:04 Wingard, [name] ([id number]) -------------------------------------------------------------------------------- Vitals Details Patient Name: Tacey, [name] Date of Service: 11/01/2017 1:15 PM Medical Record Number: [id number] Patient Account Number: [id number] Date of Birth/Sex: [birth year] (61 y.o. Female) Treating RN: Huel Coventry Primary Care Akaash Vandewater: Karie Fetch Other Clinician: Referring Joah Patlan: Karie Fetch Treating Nalah Macioce/Extender: Rudene Re in  Treatment: 4 Vital Signs Time Taken:  13:34 Temperature (F): 98 Height (in): 60 Pulse (bpm): 59 Weight (lbs): 230 Respiratory Rate (breaths/min): 16 Body Mass Index (BMI): 44.9 Blood Pressure (mmHg): 174/73 Reference Range: 80 - 120 mg / dl Electronic Signature(s) Signed: 11/01/2017 5:10:23 PM By: Elliot GurneyWoody, BSN, RN, CWS, Kim RN, BSN Entered By: Elliot GurneyWoody, BSN, RN, CWS, Kim on 11/01/2017 13:34:49

## 2017-11-03 NOTE — Progress Notes (Addendum)
[name], [name] ([id number]) Visit Report for 11/01/2017 Chief Complaint Document Details Patient Name: [name], [name] Date of Service: 11/01/2017 1:15 PM Medical Record Number: [id number] Patient Account Number: [id number] Date of Birth/Sex: August 07, 1956 (61 y.o. Female) Treating RN: Primary Care Provider: Karie [name] Other Clinician: Referring Provider: Karie [name] Treating Provider/Extender: Rudene Re in Treatment: 4 Information Obtained from: Patient Chief Complaint Patient presents for treatment of an open ulcer due to venous insufficiency which she's had for several months. she is a return patient who we had seen several years ago and has not been able to get appropriate treatment due to the lack of insurance. Electronic Signature(s) Signed: 11/01/2017 2:25:11 PM By: Evlyn Kanner MD, FACS Entered By: Evlyn Kanner on 11/01/2017 14:25:10 [name], [name] ([id number]) -------------------------------------------------------------------------------- HPI Details Patient Name: [name], [name] Date of Service: 11/01/2017 1:15 PM Medical Record Number: [id number] Patient Account Number: [id number] Date of Birth/Sex: 1956/07/17 (61 y.o. Female) Treating RN: Primary Care Provider: Karie [name] Other Clinician: Referring Provider: Karie [name] Treating Provider/Extender: Rudene Re in Treatment: 4 History of Present Illness Location: swelling of the left lower extremity with ulceration Quality: Patient reports experiencing a dull pain to affected area(s). Severity: Patient states wound are getting worse. Duration: Patient has had the wound for > 3 months prior to seeking treatment at the wound center Timing: Pain in wound is Intermittent (comes and goes Context: The wound would happen gradually Modifying Factors: Other treatment(s) tried include:Neosporin ointment locally Associated Signs and Symptoms: Patient reports having increase discharge. HPI  Description: 61 year old patient who is known to our practice from a previous visit here in 2015 and 2017 for a similar problem. At that stage, she was lost to follow-up, because she did not have insurance and did not complete a venous ablation. I understand she has now obtained insurance and has an appointment to see Dr. Hart Rochester at vein and vascular in [place], on November 5th. Other than that the patient has had no other issues 10/18/17 on evaluation today patient appears to be doing okay in regard to her left anterior lower extremity wound. She tells me she does have some discomfort but the wraps that we are performing which is a three layer wrap seems to be of benefit for her. She also has an appointment upcoming with Dr. Hart Rochester from November 5 to discuss options for preventing these type of venous ulcers from occurring in the future. No fevers chills noted. 11/01/2017 -- she was seen by Dr. Hart Rochester on 10/28/2017, and noted that there was an unsuccessful left great saphenous vein ablation done 3 years ago in [facility]. on doing a venous duplex exam of the left leg patient had a large caliber left great saphenous vein which had rapid cross reflux throughout identified from the proximal calf to the saphenofemoral junction. There was also a large caliber left small saphenous vein. he recommended laser ablation of the left great saphenous vein from the proximal calf to near the saphenofemoral junction as soon as possible. she is willing to proceed ======= Old notes This patient was been previously seen at the wound center is being seen by me for the first time today. 61F with h/o lower extremity ulcer swelling presents with a left lower extremity ulcer (first presented to [place] on 07/12/14) . the patient is deaf and lip reads and is fairly conversant. She has had venous ablation in 2015 but due to lack of insurance and money she has been unable to follow-up with the vascular surgeons  not  she been wearing compression garments. Does not have a past medical history of diabetes or other issues. He has not been wearing compression stockings and we did try to get her Velcro wraps but she is unable to afford the copayment. Addendum: I have found some reports from August 2015 when an arterial study was done and the left lower extremity had triphasic waveform showing no arterial insufficiency. She was also to found to have left greater saphenous vein reflux throughout with tortuous veins and on 08/27/2014 she had a laser ablation done. At a follow-up on September 8 it was found that she needed a second procedure as a first procedure was not completely successful. Due to the patient's insurance issues she has not gone back for the second procedure. 03/02/2016 -- he is here with the interpreter today and we have had a thorough discussion about all the above and all questions answered. ========== [name], [name] ([id number]) Electronic Signature(s) Signed: 11/01/2017 2:25:21 PM By: Evlyn KannerBritto, Arjuna Doeden MD, FACS Previous Signature: 11/01/2017 1:40:54 PM Version By: Evlyn KannerBritto, Olita Takeshita MD, FACS Previous Signature: 11/01/2017 1:39:01 PM Version By: Evlyn KannerBritto, Allyne Hebert MD, FACS Entered By: Evlyn KannerBritto, Marrion Accomando on 11/01/2017 14:25:20 [name], [name] ([id number]) -------------------------------------------------------------------------------- Physical Exam Details Patient Name: [name], [name] Date of Service: 11/01/2017 1:15 PM Medical Record Number: [id number] Patient Account Number: [id number] Date of Birth/Sex: Nov 21, 1956 (61 y.o. Female) Treating RN: Primary Care Provider: Karie FetchAYCOCK, NGWE Other Clinician: Referring Provider: Karie FetchAYCOCK, NGWE Treating Provider/Extender: Rudene ReBritto, Fama Muenchow Weeks in Treatment: 4 Constitutional . Pulse regular. Respirations normal and unlabored. Afebrile. . Eyes Nonicteric. Reactive to light. Ears, Nose, Mouth, and Throat Lips, teeth, and gums WNL.Marland Kitchen. Moist mucosa without  lesions. Neck supple and nontender. No palpable supraclavicular or cervical adenopathy. Normal sized without goiter. Respiratory WNL. No retractions.. Cardiovascular Pedal Pulses WNL. No clubbing, cyanosis or edema. Lymphatic No adneopathy. No adenopathy. No adenopathy. Musculoskeletal Adexa without tenderness or enlargement.. Digits and nails w/o clubbing, cyanosis, infection, petechiae, ischemia, or inflammatory conditions.. Integumentary (Hair, Skin) No suspicious lesions. No crepitus or fluctuance. No peri-wound warmth or erythema. No masses.Marland Kitchen. Psychiatric Judgement and insight Intact.. No evidence of depression, anxiety, or agitation.. Notes the wound was washed out nicely with moist saline gauze and all the debris was removed. The lymphedema is a little better but she will continue to need silver alginate and a 3 layer compression wrap Electronic Signature(s) Signed: 11/01/2017 2:26:12 PM By: Evlyn KannerBritto, Alla Sloma MD, FACS Entered By: Evlyn KannerBritto, Timiyah Romito on 11/01/2017 14:26:11 [name], [name] ([id number]) -------------------------------------------------------------------------------- Physician Orders Details Patient Name: [name], [name] Date of Service: 11/01/2017 1:15 PM Medical Record Number: [id number] Patient Account Number: [id number] Date of Birth/Sex: Nov 21, 1956 (61 y.o. Female) Treating RN: Huel CoventryWoody, Kim Primary Care Provider: Karie FetchAYCOCK, NGWE Other Clinician: Referring Provider: Karie FetchAYCOCK, NGWE Treating Provider/Extender: Rudene ReBritto, Iver Fehrenbach Weeks in Treatment: 4 Verbal / Phone Orders: No Diagnosis Coding Wound Cleansing Wound #3 Left,Medial,Anterior Lower Leg o May Shower, gently pat wound dry prior to applying new dressing. Anesthetic Wound #3 Left,Medial,Anterior Lower Leg o Topical Lidocaine 4% cream applied to wound bed prior to debridement Primary Wound Dressing Wound #3 Left,Medial,Anterior Lower Leg o Prisma Ag Secondary Dressing Wound #3 Left,Medial,Anterior  Lower Leg o ABD pad o XtraSorb Dressing Change Frequency Wound #3 Left,Medial,Anterior Lower Leg o Change dressing every week Follow-up Appointments Wound #3 Left,Medial,Anterior Lower Leg o Return Appointment in 1 week. o Nurse Visit as needed Edema Control Wound #3 Left,Medial,Anterior Lower Leg o 3 Layer Compression System - Left Lower Extremity Electronic Signature(s) Signed: 11/01/2017 3:39:10  PM By: Evlyn Kanner MD, FACS Signed: 11/01/2017 5:10:23 PM By: Elliot Gurney, BSN, RN, CWS, Kim RN, BSN Entered By: Elliot Gurney, BSN, RN, CWS, Kim on 11/01/2017 14:10:40 Kaman, [name] ([id number]) -------------------------------------------------------------------------------- Problem List Details Patient Name: [name], [name] Date of Service: 11/01/2017 1:15 PM Medical Record Number: [id number] Patient Account Number: [id number] Date of Birth/Sex: 1956-06-20 (61 y.o. 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 ([id number]) -------------------------------------------------------------------------------- Progress Note Details Patient Name: [name], [name] Date of Service: 11/01/2017 1:15 PM Medical Record Number: [id number] Patient Account Number: [id number] Date of Birth/Sex: [birth year] (61 y.o. Female) Treating RN: Primary Care  Provider: Karie [name] Other Clinician: Referring Provider: Karie [name] Treating Provider/Extender: Rudene Re in Treatment: 4 Subjective Chief Complaint Information obtained from Patient Patient presents for treatment of an open ulcer due to venous insufficiency which she's had for several months. she is a return patient who we had seen several years ago and has not been able to get appropriate treatment due to the lack of insurance. History of Present Illness (HPI) The following HPI elements were documented for the patient's wound: Location: swelling of the left lower extremity with ulceration Quality: Patient reports experiencing a dull pain to affected area(s). Severity: Patient states wound are getting worse. Duration: Patient has had the wound for > 3 months prior to seeking treatment at the wound center Timing: Pain in wound is Intermittent (comes and goes Context: The wound would happen gradually Modifying Factors: Other treatment(s) tried include:Neosporin ointment locally Associated Signs and Symptoms: Patient reports having increase discharge. 61 year old patient who is known to our practice from a previous visit here in 2015 and 2017 for a similar problem. At that stage, she was lost to follow-up, because she did not have insurance and did not complete a venous ablation. I understand she has now obtained insurance and has an appointment to see Dr. Hart Rochester at vein and vascular in [place], on November 5th. Other than that the patient has had no other issues 10/18/17 on evaluation today patient appears to be doing okay in regard to her left anterior lower extremity wound. She tells me she does have some discomfort but the wraps that we are performing which is a three layer wrap seems to be of benefit for her. She also has an appointment upcoming with Dr. Hart Rochester from November 5 to discuss options for preventing these type of venous ulcers from occurring in the future.  No fevers chills noted. 11/01/2017 -- she was seen by Dr. Hart Rochester on 10/28/2017, and noted that there was an unsuccessful left great saphenous vein ablation done 3 years ago in [facility] on doing a venous duplex exam of the left leg patient had a large caliber left great saphenous vein which had rapid cross reflux throughout identified from the proximal calf to the saphenofemoral junction. There was also a large caliber left small saphenous vein. he recommended laser ablation of the left great saphenous vein from the proximal calf to near the saphenofemoral junction  as soon as possible. she is willing to proceed ======= Old notes This patient was been previously seen at the wound center is being seen by me for the first time today. 64F with h/o lower extremity swelling presents with a left lower extremity ulcer (first presented to us on 07/12/14) . the patient is deaf and lip reads and is fairly conversant. She has had venous ablation in 2015 but due to lack of insurance and money she has been unable to follow-up with the vascular surgeons not she been wearing compression garments. Does not have a past medical history of diabetes or other issues. He has not been wearing compression stockings and we did try to get her Velcro wraps but she is unable to afford the copayment. Addendum: I have found some reports from August 2015 when an arterial study was done and the left lower extremity had L[place]au, [name] ([id number]) triphasic waveform showing no arterial insufficiency. She was also to found to have left greater saphenous vein reflux throughout with tortuous veins and on 08/27/2014 she had a laser ablation done. At a follow-up on September 8 it was found that she needed a second procedure as a first procedure was not completely successful. Due to the patient's insurance issues she has not gone back for the second procedure. 03/02/2016 -- he is here with the interpreter today  and we have had a thorough discussion about all the above and all questions answered. ========== Patient History Information obtained from Patient. Family History Cancer, Diabetes - Mother, Heart Disease - Mother,Father, Hypertension - Mother, Kidney Disease - Mother, Lung Disease - Mother, Stroke - Mother, Thyroid Problems - Mother, No family history of Seizures, Tuberculosis. Social History Former smoker - quit 2005, Marital Status - Divorced, Alcohol Use - Never, Drug Use - No History, Caffeine Use - Daily. Medical History Hospitalization/Surgery History - 06/23/2004, [facility] Removal. Medical And Surgical History Notes Ear/Nose/Mouth/Throat 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) Objective Constitutional Pulse regular. Respirations normal and unlabored. Afebrile. Vitals Time Taken: 1:34 PM, Height: 60 in, Weight: 230 lbs, BMI: 44.9, Temperature: 98 F, Pulse: 59 bpm, Respiratory Rate: 16 breaths/min, Blood Pressure: 174/73 mmHg. Eyes Nonicteric. Reactive to light. Ears, Nose, Mouth, and Throat Lips, teeth, and gums WNL.Marland Kitchen. Moist mucosa without lesions. Neck supple and nontender. No palpable supraclavicular or cervical adenopathy. Normal sized without goiter. Respiratory WNL. No retractions.Marland Kitchen. Finger, [name] ([id number]) Cardiovascular Pedal Pulses WNL. No clubbing, cyanosis or edema. Lymphatic No adneopathy. No adenopathy. No adenopathy. Musculoskeletal Adexa without tenderness or enlargement.. Digits and nails w/o clubbing, cyanosis, infection, petechiae, ischemia, or inflammatory conditions.Marland Kitchen. Psychiatric Judgement and insight Intact.. No evidence of depression, anxiety, or agitation.. General Notes: the wound was washed out nicely with moist saline gauze and all the debris was removed. The lymphedema is a little better but she will continue to need silver alginate and a 3 layer compression wrap Integumentary (Hair, Skin) No suspicious lesions.  No crepitus or fluctuance. No peri-wound warmth or erythema. No masses.. Wound #3 status is Open. Original cause of wound was Gradually Appeared. The wound is located on the Left,Medial,Anterior Lower Leg. The wound measures 2.4cm length x 2.4cm width x 0.1cm depth; 4.524cm^2 area and 0.452cm^3 volume. There is Fat Layer (Subcutaneous Tissue) Exposed exposed. There is no tunneling or undermining noted. There is a large amount of serosanguineous drainage noted. The wound margin is flat and intact. There is large (67- 100%) red, hyper - granulation within the wound bed.  There is a small (1-33%) amount of necrotic tissue within the wound bed including Adherent Slough. The periwound skin appearance exhibited: Induration, Hemosiderin Staining. The periwound skin appearance did not exhibit: Callus, Crepitus, Excoriation, Rash, Scarring, Dry/Scaly, Maceration, Atrophie Blanche, Cyanosis, Ecchymosis, Mottled, Pallor, Rubor, Erythema. Periwound temperature was noted as No Abnormality. The periwound has tenderness on palpation. 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 Anesthetic: Wound #3 Left,Medial,Anterior Lower Leg: Topical Lidocaine 4% cream applied to wound bed prior to debridement Primary Wound Dressing: Wound #3 Left,Medial,Anterior Lower Leg: [name], [name] Prisma Ag Secondary Dressing: Wound #3 Left,Medial,Anterior Lower Leg: ABD pad XtraSorb Dressing Change Frequency: Wound #3 Left,Medial,Anterior Lower Leg: Change dressing every week Follow-up Appointments: Wound #3 Left,Medial,Anterior Lower Leg: Return Appointment in 1  week. Nurse Visit as needed Edema Control: Wound #3 Left,Medial,Anterior Lower Leg: 3 Layer Compression System - Left Lower Extremity we have discussed with her the need to get custom-made compression stockings, and we have discussed the ways to do this. I have also noted details of her vascular consultation and have discussed the treatment plan in great detail with her and she is going to be compliant. I have recommended: 1. silver alginate with a 3 layer Profore 2. Elevation and exercise discussed with her in great detail 3. to keep her appointment with the vascular surgeon, for the endovenous ablation when fixed 4. Regular visits to the wound center Electronic Signature(s) Signed: 11/01/2017 2:27:37 PM By: Evlyn Kanner MD, FACS Entered By: Evlyn Kanner on 11/01/2017 14:27:37 [name], [name] ([id number]) -------------------------------------------------------------------------------- ROS/PFSH Details Patient Name: [name], [name] Date of Service: 11/01/2017 1:15 PM Medical Record Number: [id number] Patient Account Number: [id number] Date of Birth/Sex: 01/30/1956 (61 y.o. Female) Treating RN: Primary Care Provider: Karie [name] Other Clinician: Referring Provider: Karie [name] Treating Provider/Extender: Rudene Re in Treatment: 4 Information Obtained From Patient Wound History Do you currently have one or more open woundso Yes How many open wounds do you currently haveo 1 Approximately how long have you had your woundso 2 months How have you been treating your wound(s) until nowo neosporin Has your wound(s) ever healed and then re-openedo Yes Have you had any lab work done in the past montho No Have you tested positive for an antibiotic resistant organism (MRSA, VRE)o No Have you tested positive for osteomyelitis (bone infection)o No Have you had any tests for circulation on your legso Yes Have you had other problems associated with your woundso Infection,  Swelling Eyes Medical History: Negative for: Cataracts; Glaucoma; Optic Neuritis Ear/Nose/Mouth/Throat Medical History: Positive for: Middle ear problems Negative for: Chronic sinus problems/congestion Past Medical History Notes: 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) Hematologic/Lymphatic Medical History: Negative for: Anemia; Hemophilia; Human Immunodeficiency Virus; Lymphedema; Sickle Cell Disease Respiratory Medical History: Negative for: Aspiration; Asthma; Chronic Obstructive Pulmonary Disease (COPD); Pneumothorax; Sleep Apnea; Tuberculosis Cardiovascular Medical History: Positive for: Hypertension; Peripheral Venous Disease Negative for: Angina; Arrhythmia; Congestive Heart Failure; Coronary Artery Disease; Deep Vein Thrombosis; Hypotension; Myocardial Infarction; Peripheral Arterial Disease; Vasculitis Gastrointestinal Medical History: Negative for: Cirrhosis ; Colitis; Crohnos; Hepatitis A; Hepatitis B; Hepatitis C [name], [name] ([id number]) Endocrine Medical History: Negative for: Type I Diabetes;  Type II Diabetes Genitourinary Medical History: Negative for: End Stage Renal Disease Immunological Medical History: Negative for: Lupus Erythematosus; Raynaudos; Scleroderma Integumentary (Skin) Medical History: Negative for: History of Burn; History of pressure wounds Musculoskeletal Medical History: Positive for: Osteoarthritis Negative for: Gout; Rheumatoid Arthritis; Osteomyelitis Neurologic Medical History: Negative for: Dementia; Neuropathy; Quadriplegia; Paraplegia; Seizure Disorder Oncologic Medical History: Negative for: Received Chemotherapy; Received Radiation Psychiatric Medical History: Negative for: Anorexia/bulimia; Confinement Anxiety HBO Extended History Items Ear/Nose/Mouth/Throat: Middle ear problems Immunizations Pneumococcal Vaccine: Received Pneumococcal Vaccination: No Implantable  Devices Hospitalization / Surgery History Name of Hospital Purpose of Hospitalization/Surgery Date [facility] Removal 06/23/2004 Family and Social History Cancer: Yes; Diabetes: Yes - Mother; Heart Disease: Yes - Mother,Father; Hypertension: Yes - Mother; Kidney Disease: Yes - Mother; Lung Disease: Yes - Mother; Seizures: No; Stroke: Yes - Mother; Thyroid Problems: Yes - Mother; Tuberculosis: No; Former smoker - quit 2005; Marital Status - Divorced; Alcohol Use: Never; Drug Use: No History; Caffeine Use: Daily; [place], [name] ([id number]) Financial Concerns: Yes; Food, Clothing or Shelter Needs: No; Support System Lacking: No; Transportation Concerns: No; Advanced Directives: No; Patient does not want information on Advanced Directives; Do not resuscitate: No; Living Will: No; Medical Power of Attorney: No Physician Affirmation I have reviewed and agree with the above information. Electronic Signature(s) Signed: 11/01/2017 3:39:10 PM By: Evlyn Kanner MD, FACS Entered By: Evlyn Kanner on 11/01/2017 14:25:31 [name], [name] ([id number]) -------------------------------------------------------------------------------- SuperBill Details Patient Name: Hustead, [name] Date of Service: 11/01/2017 Medical Record Number: [id number] Patient Account Number: [id number] Date of Birth/Sex: 1956-04-12 (61 y.o. 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 E66.01 Morbid (severe) obesity due to excess calories Facility Procedures CPT4 Code: [id number] Description: 99213 - WOUND CARE VISIT-LEV 3 EST PT Modifier: Quantity: 1 Physician Procedures CPT4 Code  Description: [id number] - WC PHYS LEVEL 3 - EST PT ICD-10 Diagnosis Description L97.222 Non-pressure chronic ulcer of left calf with fat layer expose I83.222 Varicose veins of left lower extremity with both ulcer of cal E66.01 Morbid  (severe) obesity due to excess calories Modifier: d f and inflammation Quantity: 1 Electronic Signature(s) Signed: 11/01/2017 2:28:11 PM By: Evlyn Kanner MD, FACS Entered By: Evlyn Kanner on 11/01/2017 14:28:10

## 2017-11-05 ENCOUNTER — Other Ambulatory Visit: Payer: Self-pay | Admitting: *Deleted

## 2017-11-05 DIAGNOSIS — I83222 Varicose veins of left lower extremity with both ulcer of calf and inflammation: Secondary | ICD-10-CM | POA: Diagnosis not present

## 2017-11-05 DIAGNOSIS — I83892 Varicose veins of left lower extremities with other complications: Secondary | ICD-10-CM

## 2017-11-06 NOTE — Progress Notes (Signed)
[name], [name] ([id number]) Visit Report for 11/05/2017 Arrival Information Details Patient Name: [name], [name] Date of Service: 11/05/2017 3:00 PM Medical Record Number: [id number] Patient Account Number: [id number] Date of Birth/Sex: 09-01-1956 (61 y.o. Female) Treating RN: Huel CoventryWoody, Kim Primary Care Marcia Lepera: Karie FetchAYCOCK, NGWE Other Clinician: Referring Kaisha Wachob: Karie FetchAYCOCK, NGWE Treating Liany Mumpower/Extender: Altamese CarolinaOBSON, MICHAEL G Weeks in Treatment: 4 Visit Information History Since Last Visit Added or deleted any medications: No Patient Arrived: Ambulatory Any new allergies or adverse reactions: No Arrival Time: 14:51 Had a fall or experienced change in No Accompanied By: self activities of daily living that may affect Transfer Assistance: None risk of falls: Patient Identification Verified: Yes Signs or symptoms of abuse/neglect since last visito No Secondary Verification Process Yes Hospitalized since last visit: No Completed: Pain Present Now: No Patient Requires Transmission-Based No Precautions: Patient Has Alerts: Yes Patient Alerts: Patient on Blood Thinner NOT DIABETIC Electronic Signature(s) Signed: 11/05/2017 5:09:04 PM By: Elliot GurneyWoody, BSN, RN, CWS, Kim RN, BSN Entered By: Elliot GurneyWoody, BSN, RN, CWS, Kim on 11/05/2017 14:51:41 [name], [name] ([id number]) -------------------------------------------------------------------------------- Compression Therapy Details Patient Name: [name], [name] Date of Service: 11/05/2017 3:00 PM Medical Record Number: [id number] Patient Account Number: [id number] Date of Birth/Sex: 09-01-1956 (61 y.o. Female) Treating RN: Huel CoventryWoody, Kim Primary Care Nyemah Watton: Karie FetchAYCOCK, NGWE Other Clinician: Referring Alysia Scism: Karie FetchAYCOCK, NGWE Treating Benzion Mesta/Extender: Altamese CarolinaOBSON, MICHAEL G Weeks in Treatment: 4 Compression Therapy Performed for Wound Assessment: Wound #3 Left,Medial,Anterior Lower Leg Performed By: Clinician Huel CoventryWoody, Kim, RN Compression  Type: Three Layer Pre Treatment ABI: 1.2 Electronic Signature(s) Signed: 11/05/2017 5:09:04 PM By: Elliot GurneyWoody, BSN, RN, CWS, Kim RN, BSN Entered By: Elliot GurneyWoody, BSN, RN, CWS, Kim on 11/05/2017 15:02:30 [name], [name] ([id number]) -------------------------------------------------------------------------------- Encounter Discharge Information Details Patient Name: [name], [name] Date of Service: 11/05/2017 3:00 PM Medical Record Number: [id number] Patient Account Number: [id number] Date of Birth/Sex: 09-01-1956 (61 y.o. Female) Treating RN: Huel CoventryWoody, Kim Primary Care Clark Cuff: Karie FetchAYCOCK, NGWE Other Clinician: Referring Jonnette Nuon: Karie FetchAYCOCK, NGWE Treating Courtne Lighty/Extender: Altamese CarolinaOBSON, MICHAEL G Weeks in Treatment: 4 Encounter Discharge Information Items Discharge Pain Level: 0 Discharge Condition: Stable Ambulatory Status: Ambulatory Discharge Destination: Home Private Transportation: Auto Accompanied By: self Schedule Follow-up Appointment: Yes Medication Reconciliation completed and Yes provided to Patient/Care Raliyah Montella: Clinical Summary of Care: Electronic Signature(s) Signed: 11/05/2017 5:09:04 PM By: Elliot GurneyWoody, BSN, RN, CWS, Kim RN, BSN Entered By: Elliot GurneyWoody, BSN, RN, CWS, Kim on 11/05/2017 15:04:48 [name], [name] ([id number]) -------------------------------------------------------------------------------- Patient/Caregiver Education Details Patient Name: [name], [name] Date of Service: 11/05/2017 3:00 PM Medical Record Number: [id number] Patient Account Number: [id number] Date of Birth/Gender: 09-01-1956 (61 y.o. Female) Treating RN: Huel CoventryWoody, Kim Primary Care Physician: Karie FetchAYCOCK, NGWE Other Clinician: Referring Physician: Karie FetchAYCOCK, NGWE Treating Physician/Extender: Altamese CarolinaOBSON, MICHAEL G Weeks in Treatment: 4 Education Assessment Education Provided To: Patient Education Topics Provided Venous: Handouts: Controlling Swelling with Multilayered Compression Wraps Methods:  Demonstration, Explain/Verbal Responses: State content correctly Electronic Signature(s) Signed: 11/05/2017 5:09:04 PM By: Elliot GurneyWoody, BSN, RN, CWS, Kim RN, BSN Entered By: Elliot GurneyWoody, BSN, RN, CWS, Kim on 11/05/2017 15:04:35 [name], [name] ([id number]) -------------------------------------------------------------------------------- Wound Assessment Details Patient Name: [name], [name] Date of Service: 11/05/2017 3:00 PM Medical Record Number: [id number] Patient Account Number: [id number] Date of Birth/Sex: 09-01-1956 (61 y.o. Female) Treating RN: Huel CoventryWoody, Kim Primary Care Kekai Geter: Karie FetchAYCOCK, NGWE Other Clinician: Referring Braden Deloach: Karie FetchAYCOCK, NGWE Treating Iraida Cragin/Extender: Maxwell CaulOBSON, MICHAEL G Weeks in Treatment: 4 Wound Status Wound Number: 3 Primary Etiology: Venous Leg Ulcer Wound Location: Left, Medial, Anterior Lower Leg Wound Status: Open Wounding Event: Gradually Appeared Date Acquired:  07/29/2017 Weeks Of Treatment: 4 Clustered Wound: No Wound Measurements Length: (cm) 2.2 Width: (cm) 2.4 Depth: (cm) 0.1 Area: (cm) 4.147 Volume: (cm) 0.415 % Reduction in Area: -5.6% % Reduction in Volume: 47.1% Wound Description Full Thickness Without Exposed Support Classification: Structures Periwound Skin Texture Texture Color No Abnormalities Noted: No No Abnormalities Noted: No Moisture No Abnormalities Noted: No Treatment Notes Wound #3 (Left, Medial, Anterior Lower Leg) 1. Cleansed with: Clean wound with Normal Saline 4. Dressing Applied: Prisma Ag 5. Secondary Dressing Applied ABD Pad Gauze and Kerlix/Conform 7. Secured with 3 Layer Compression System - Left Lower Extremity Electronic Signature(s) Signed: 11/05/2017 5:09:04 PM By: Elliot GurneyWoody, BSN, RN, CWS, Kim RN, BSN Entered By: Elliot GurneyWoody, BSN, RN, CWS, Kim on 11/05/2017 14:56:07

## 2017-11-08 ENCOUNTER — Encounter: Payer: BLUE CROSS/BLUE SHIELD | Admitting: Surgery

## 2017-11-08 DIAGNOSIS — I83222 Varicose veins of left lower extremity with both ulcer of calf and inflammation: Secondary | ICD-10-CM | POA: Diagnosis not present

## 2017-11-10 NOTE — Progress Notes (Signed)
[place], [name] ([id number]) Visit Report for 11/08/2017 Chief Complaint Document Details Patient Name: [name], [name] Date of Service: 11/08/2017 3:00 PM Medical Record Number: [id number] Patient Account Number: [id number] Date of Birth/Sex: 01-09-1956 (61 y.o. Female) Treating RN: Primary Care Provider: Karie Fetch Other Clinician: Referring Provider: Karie Fetch Treating Provider/Extender: Rudene Re in Treatment: 5 Information Obtained from: Patient Chief Complaint Patient presents for treatment of an open ulcer due to venous insufficiency which she's had for several months. she is a return patient who we had seen several years ago and has not been able to get appropriate treatment due to the lack of insurance. Electronic Signature(s) Signed: 11/08/2017 4:03:26 PM By: Evlyn Kanner MD, FACS Entered By: Evlyn Kanner on 11/08/2017 16:03:25 [place], [name] ([id number]) -------------------------------------------------------------------------------- HPI Details Patient Name: [name], [name] Date of Service: 11/08/2017 3:00 PM Medical Record Number: [id number] Patient Account Number: [id number] Date of Birth/Sex: Jul 03, 1956 (61 y.o. Female) Treating RN: Primary Care Provider: Karie Fetch Other Clinician: Referring Provider: Karie Fetch Treating Provider/Extender: Rudene Re in Treatment: 5 History of Present Illness Location: swelling of the left lower extremity with ulceration Quality: Patient reports experiencing a dull pain to affected area(s). Severity: Patient states wound are getting worse. Duration: Patient has had the wound for > 3 months prior to seeking treatment at the wound center Timing: Pain in wound is Intermittent (comes and goes Context: The wound would happen gradually Modifying Factors: Other treatment(s) tried include:Neosporin ointment locally Associated Signs and Symptoms: Patient reports having increase  discharge. HPI Description: 61 year old patient who is known to our practice from a previous visit here in 2015 and 2017 for a similar problem. At that stage, she was lost to follow-up, because she did not have insurance and did not complete a venous ablation. I understand she has now obtained insurance and has an appointment to see Dr. Hart Rochester at [facility] vascular in [place], on November 5th. Other than that the patient has had no other issues 10/18/17 on evaluation today patient appears to be doing okay in regard to her left anterior lower extremity wound. She tells me she does have some discomfort but the wraps that we are performing which is a three layer wrap seems to be of benefit for her. She also has an appointment upcoming with Dr. Hart Rochester from November 5 to discuss options for preventing these type of venous ulcers from occurring in the future. No fevers chills noted. 11/01/2017 -- she was seen by Dr. Hart Rochester on 10/28/2017, and noted that there was an unsuccessful left great saphenous vein ablation done 3 years ago in [facility]. on doing a venous duplex exam of the left leg patient had a large caliber left great saphenous vein which had rapid cross reflux throughout identified from the proximal calf to the saphenofemoral junction. There was also a large caliber left small saphenous vein. he recommended laser ablation of the left great saphenous vein from the proximal calf to near the saphenofemoral junction as soon as possible. she is willing to proceed 11/08/2017 -- I understand her endovenous ablation has been planned for November 20 and she will see [place] sometime after this ======= Old notes This patient was been previously seen at the wound center is being seen by me for the first time today. 74F with h/o lower extremity swelling presents with a left lower extremity ulcer (first presented to [place] on 07/12/14) . the patient is deaf and lip reads and is fairly  conversant. She has had  venous ablation in 2015 but due to lack of insurance and money she has been unable to follow-up with the vascular surgeons not she been wearing compression garments. Does not have a past medical history of diabetes or other issues. He has not been wearing compression stockings and we did try to get her Velcro wraps but she is unable to afford the copayment. Addendum: I have found some reports from August 2015 when an arterial study was done and the left lower extremity had triphasic waveform showing no arterial insufficiency. She was also to found to have left greater saphenous vein reflux throughout with tortuous veins and on 08/27/2014 she had a laser ablation done. At a follow-up on September 8 it was found that she needed a second procedure as a first procedure was not completely successful. Due to the patient's insurance issues she has not gone back for the second procedure. 03/02/2016 -- he is here with the interpreter today and we have had a thorough discussion about all the above and all [place], [name] ([id number]) questions answered. ========== Electronic Signature(s) Signed: 11/08/2017 4:04:01 PM By: Evlyn KannerBritto, Redonna Wilbert MD, FACS Entered By: Evlyn KannerBritto, Tucker Minter on 11/08/2017 16:04:00 [place], [place] AMarland Kitchen. ([id number]) -------------------------------------------------------------------------------- Physical Exam Details Patient Name: [name], [name] Date of Service: 11/08/2017 3:00 PM Medical Record Number: [id number] Patient Account Number: [id number] Date of Birth/Sex: 1956-04-26 (61 y.o. Female) Treating RN: Primary Care Provider: Karie FetchAYCOCK, NGWE Other Clinician: Referring Provider: Karie FetchAYCOCK, NGWE Treating Provider/Extender: Rudene ReBritto, Jess Sulak Weeks in Treatment: 5 Constitutional . Pulse regular. Respirations normal and unlabored. Afebrile. . Eyes Nonicteric. Reactive to light. Ears, Nose, Mouth, and Throat Lips, teeth, and gums WNL.Marland Kitchen. Moist mucosa without  lesions. Neck supple and nontender. No palpable supraclavicular or cervical adenopathy. Normal sized without goiter. Respiratory WNL. No retractions.. Cardiovascular Pedal Pulses WNL. No clubbing, cyanosis or edema. Lymphatic No adneopathy. No adenopathy. No adenopathy. Musculoskeletal Adexa without tenderness or enlargement.. Digits and nails w/o clubbing, cyanosis, infection, petechiae, ischemia, or inflammatory conditions.. Integumentary (Hair, Skin) No suspicious lesions. No crepitus or fluctuance. No peri-wound warmth or erythema. No masses.Marland Kitchen. Psychiatric Judgement and insight Intact.. No evidence of depression, anxiety, or agitation.. Notes the wound is looking clean and no debridement was required. The lymphedema still persist and we will continue with silver alginate and a 3 layer compression wrap Electronic Signature(s) Signed: 11/08/2017 4:04:34 PM By: Evlyn KannerBritto, Eduard Penkala MD, FACS Entered By: Evlyn KannerBritto, Lauren Modisette on 11/08/2017 16:04:34 [place], [name] ([id number]) -------------------------------------------------------------------------------- Physician Orders Details Patient Name: [name], [name] Date of Service: 11/08/2017 3:00 PM Medical Record Number: [id number] Patient Account Number: [id number] Date of Birth/Sex: 1956-04-26 (61 y.o. Female) Treating RN: Renne CriglerFlinchum, Cheryl Primary Care Provider: Karie FetchAYCOCK, NGWE Other Clinician: Referring Provider: Karie FetchAYCOCK, NGWE Treating Provider/Extender: Rudene ReBritto, Saw Mendenhall Weeks in Treatment: 5 Verbal / Phone Orders: No Diagnosis Coding Wound Cleansing Wound #3 Left,Medial,Anterior Lower Leg o May Shower, gently pat wound dry prior to applying new dressing. Anesthetic Wound #3 Left,Medial,Anterior Lower Leg o Topical Lidocaine 4% cream applied to wound bed prior to debridement Primary Wound Dressing Wound #3 Left,Medial,Anterior Lower Leg o Prisma Ag Secondary Dressing Wound #3 Left,Medial,Anterior Lower Leg o ABD pad Dressing  Change Frequency Wound #3 Left,Medial,Anterior Lower Leg o Change dressing every week Follow-up Appointments Wound #3 Left,Medial,Anterior Lower Leg o Return Appointment in 1 week. o Nurse Visit as needed Edema Control Wound #3 Left,Medial,Anterior Lower Leg o 3 Layer Compression System - Left Lower Extremity Electronic Signature(s) Signed: 11/08/2017 3:24:39 PM By: Renne CriglerFlinchum, Cheryl Signed: 11/08/2017 4:27:13 PM By: Evlyn KannerBritto, Jaydi Bray MD,  FACS Entered By: Renne Crigler on 11/08/2017 15:24:04 [place], [place] AMarland Kitchen ([id number]) -------------------------------------------------------------------------------- Problem List Details Patient Name: [place], [name] Date of Service: 11/08/2017 3:00 PM Medical Record Number: [id number] Patient Account Number: [id number] Date of Birth/Sex: 02-29-1956 (61 y.o. 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 ([id number]) -------------------------------------------------------------------------------- Progress Note Details Patient Name: [place], [name] Date of Service: 11/08/2017 3:00 PM Medical Record Number: [id number] Patient Account Number: [id number] Date of Birth/Sex: 1956/04/06 (61 y.o. Female) Treating RN: Primary Care Provider: Karie Fetch Other Clinician: Referring Provider: Karie Fetch Treating Provider/Extender: Rudene Re in Treatment: 5 Subjective Chief Complaint Information obtained from Patient Patient presents for treatment of an open ulcer due to venous insufficiency which she's had for several months. she is a return patient who we had seen several years ago and has not been able to get appropriate treatment due to the lack of insurance. History of Present Illness (HPI) The following HPI elements were documented for the patient's wound: Location: swelling of the left lower extremity with ulceration Quality: Patient reports experiencing a dull pain to affected area(s). Severity: Patient states wound are getting worse. Duration: Patient has had the wound for > 3 months prior to seeking treatment at the wound center Timing: Pain in wound is Intermittent (comes and goes Context: The wound would happen gradually Modifying Factors: Other treatment(s) tried include:Neosporin ointment locally Associated Signs and Symptoms: Patient reports having increase discharge. 61 year old patient who is known to our practice from a previous visit here in 2015 and 2017 for a similar problem. At that stage, she was lost to follow-up, because she did not have insurance and did not complete a venous ablation. I understand she has now obtained insurance and has an appointment to see Dr. Hart Rochester at vein and vascular in [place], on November 5th. Other than that the patient has had no other issues 10/18/17 on evaluation today patient appears to be doing okay in regard to her left anterior lower extremity wound. She tells me she does have some discomfort but the wraps that we are performing which is a three layer wrap seems to be of benefit for her. She also has an appointment upcoming with Dr. Hart Rochester from November 5 to discuss options for preventing these type of venous ulcers from occurring in the future. No fevers chills noted. 11/01/2017 -- she was seen by Dr. Hart Rochester on  10/28/2017, and noted that there was an unsuccessful left great saphenous vein ablation done 3 years ago in [facility]. on doing a venous duplex exam of the left leg patient had a large caliber left great saphenous vein which had rapid cross reflux throughout identified from the proximal calf to the saphenofemoral junction. There was also a large caliber left small saphenous vein. he recommended laser ablation of the left great saphenous vein from the proximal calf to near the saphenofemoral junction as soon as possible. she is willing to proceed 11/08/2017 -- I understand her endovenous ablation has been planned for  November 20 and she will see [place] sometime after this ======= Old notes This patient was been previously seen at the wound center is being seen by me for the first time today. 83F with h/o lower extremity swelling presents with a left lower extremity ulcer (first presented to [place] on 07/12/14) . the patient is deaf and lip reads and is fairly conversant. She has had venous ablation in 2015 but due to lack of insurance and money she has been unable to follow-up with the vascular surgeons not she been wearing compression garments. Does not have a past medical history of diabetes or other issues. He has not been wearing compression stockings and we did try to get her Velcro wraps but she is unable to afford the [place], [name] ([id number]) copayment. Addendum: I have found some reports from August 2015 when an arterial study was done and the left lower extremity had triphasic waveform showing no arterial insufficiency. She was also to found to have left greater saphenous vein reflux throughout with tortuous veins and on 08/27/2014 she had a laser ablation done. At a follow-up on September 8 it was found that she needed a second procedure as a first procedure was not completely successful. Due to the patient's insurance issues she has not gone back for the second  procedure. 03/02/2016 -- he is here with the interpreter today and we have had a thorough discussion about all the above and all questions answered. ========== Patient History Information obtained from Patient. Family History Cancer, Diabetes - Mother, Heart Disease - Mother,Father, Hypertension - Mother, Kidney Disease - Mother, Lung Disease - Mother, Stroke - Mother, Thyroid Problems - Mother, No family history of Seizures, Tuberculosis. Social History Former smoker - quit 2005, Marital Status - Divorced, Alcohol Use - Never, Drug Use - No History, Caffeine Use - Daily. Medical History Hospitalization/Surgery History - 06/23/2004, [facility] Removal. Medical And Surgical History Notes Ear/Nose/Mouth/Throat 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) Objective Constitutional Pulse regular. Respirations normal and unlabored. Afebrile. Vitals Time Taken: 2:53 PM, Height: 60 in, Weight: 230 lbs, BMI: 44.9, Temperature: 98.6 F, Pulse: 62 bpm, Respiratory Rate: 16 breaths/min, Blood Pressure: 163/74 mmHg. Eyes Nonicteric. Reactive to light. Ears, Nose, Mouth, and Throat Lips, teeth, and gums WNL.Marland Kitchen Moist mucosa without lesions. Neck supple and nontender. No palpable supraclavicular or cervical adenopathy. Normal sized without goiter. [place], [name] ([id number]) Respiratory WNL. No retractions.. Cardiovascular Pedal Pulses WNL. No clubbing, cyanosis or edema. Lymphatic No adneopathy. No adenopathy. No adenopathy. Musculoskeletal Adexa without tenderness or enlargement.. Digits and nails w/o clubbing, cyanosis, infection, petechiae, ischemia, or inflammatory conditions.Marland Kitchen Psychiatric Judgement and insight Intact.. No evidence of depression, anxiety, or agitation.. General Notes: the wound is looking clean and no debridement was required. The lymphedema still persist and we will continue with silver alginate and a 3 layer compression wrap Integumentary (Hair,  Skin) No suspicious lesions. No crepitus or fluctuance. No peri-wound warmth or erythema. No masses.. Wound #3 status is Open. Original cause of wound was Gradually Appeared. The wound is located on the Left,Medial,Anterior Lower Leg. The wound measures 2.2cm length x 2.5cm width x 0.1cm depth; 4.32cm^2 area and 0.432cm^3 volume. There is Fat Layer (Subcutaneous Tissue) Exposed exposed. There is no tunneling or undermining noted. There is a medium amount of serosanguineous drainage noted. The wound margin is flat and intact. There is medium (34-66%) red granulation within the wound bed. There is a medium (34-66%) amount of necrotic tissue within the  wound bed including Eschar and Adherent Slough. The periwound skin appearance exhibited: Excoriation, Hemosiderin Staining. The periwound skin appearance did not exhibit: Callus, Crepitus, Induration, Rash, Scarring, Dry/Scaly, Maceration, Atrophie Blanche, Cyanosis, Ecchymosis, Mottled, Pallor, Rubor, Erythema. 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 There was a Three Layer Compression Therapy Procedure with a pre-treatment ABI of 1.2 by Renne Crigler, RN. Post procedure Diagnosis Wound #3: Same as Pre-Procedure [place], [name] ([id number]) Plan Wound Cleansing: Wound #3 Left,Medial,Anterior Lower Leg: May Shower, gently pat wound dry prior to applying new dressing. Anesthetic: Wound #3 Left,Medial,Anterior Lower Leg: Topical Lidocaine 4% cream applied to wound bed prior to debridement Primary Wound Dressing: Wound #3 Left,Medial,Anterior Lower Leg: Prisma Ag Secondary Dressing: Wound #3  Left,Medial,Anterior Lower Leg: ABD pad Dressing Change Frequency: Wound #3 Left,Medial,Anterior Lower Leg: Change dressing every week Follow-up Appointments: Wound #3 Left,Medial,Anterior Lower Leg: Return Appointment in 1 week. Nurse Visit as needed Edema Control: Wound #3 Left,Medial,Anterior Lower Leg: 3 Layer Compression System - Left Lower Extremity I have recommended: 1. silver alginate with a 3 layer Profore 2. she is to talk to her vendor regarding her custom-made compression wraps 3. Elevation and exercise discussed with her in great detail 4. to keep her appointment with the vascular surgeon, for the endovenous ablation later next week 5. Regular visits to the wound center Electronic Signature(s) Signed: 11/08/2017 4:05:28 PM By: Evlyn Kanner MD, FACS Entered By: Evlyn Kanner on 11/08/2017 16:05:28 [place], [place] AMarland Kitchen ([id number]) -------------------------------------------------------------------------------- ROS/PFSH Details Patient Name: [place]ter, [name] Date of Service: 11/08/2017 3:00 PM Medical Record Number: [id number] Patient Account Number: [id number] Date of Birth/Sex: 1956-05-10 (61 y.o. Female) Treating RN: Primary Care Provider: Karie Fetch Other Clinician: Referring Provider: Karie Fetch Treating Provider/Extender: Rudene Re in Treatment: 5 Information Obtained From Patient Wound History Do you currently have one or more open woundso Yes How many open wounds do you currently haveo 1 Approximately how long have you had your woundso 2 months How have you been treating your wound(s) until nowo neosporin Has your wound(s) ever healed and then re-openedo Yes Have you had any lab work done in the past montho No Have you tested positive for an antibiotic resistant organism (MRSA, VRE)o No Have you tested positive for osteomyelitis (bone infection)o No Have you had any tests for circulation on your legso Yes Have you had other problems  associated with your woundso Infection, Swelling Eyes Medical History: Negative for: Cataracts; Glaucoma; Optic Neuritis Ear/Nose/Mouth/Throat Medical History: Positive for: Middle ear problems Negative for: Chronic sinus problems/congestion Past Medical History Notes: 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) Hematologic/Lymphatic Medical History: Negative for: Anemia; Hemophilia; Human Immunodeficiency Virus; Lymphedema; Sickle Cell Disease Respiratory Medical History: Negative for: Aspiration; Asthma; Chronic Obstructive Pulmonary Disease (COPD); Pneumothorax; Sleep Apnea; Tuberculosis Cardiovascular Medical History: Positive for: Hypertension; Peripheral Venous Disease Negative for: Angina; Arrhythmia; Congestive Heart Failure; Coronary Artery Disease; Deep Vein Thrombosis; Hypotension; Myocardial Infarction; Peripheral Arterial Disease; Vasculitis Gastrointestinal Medical History: Negative for: Cirrhosis ; Colitis; Crohnos; Hepatitis A; Hepatitis B; Hepatitis C [place], [name] ([id number]) Endocrine Medical History: Negative for: Type I Diabetes; Type II Diabetes Genitourinary Medical History: Negative for: End Stage Renal  Disease Immunological Medical History: Negative for: Lupus Erythematosus; Raynaudos; Scleroderma Integumentary (Skin) Medical History: Negative for: History of Burn; History of pressure wounds Musculoskeletal Medical History: Positive for: Osteoarthritis Negative for: Gout; Rheumatoid Arthritis; Osteomyelitis Neurologic Medical History: Negative for: Dementia; Neuropathy; Quadriplegia; Paraplegia; Seizure Disorder Oncologic Medical History: Negative for: Received Chemotherapy; Received Radiation Psychiatric Medical History: Negative for: Anorexia/bulimia; Confinement Anxiety HBO Extended History Items Ear/Nose/Mouth/Throat: Middle ear problems Immunizations Pneumococcal Vaccine: Received Pneumococcal Vaccination:  No Implantable Devices Hospitalization / Surgery History Name of Hospital Purpose of Hospitalization/Surgery Date [facility] Removal 06/23/2004 Family and Social History Cancer: Yes; Diabetes: Yes - Mother; Heart Disease: Yes - Mother,Father; Hypertension: Yes - Mother; Kidney Disease: Yes - Mother; Lung Disease: Yes - Mother; Seizures: No; Stroke: Yes - Mother; Thyroid Problems: Yes - Mother; Tuberculosis: No; Former smoker - quit 2005; Marital Status - Divorced; Alcohol Use: Never; Drug Use: No History; Caffeine Use: Daily; [place], [name] ([id number]) Financial Concerns: Yes; Food, Clothing or Shelter Needs: No; Support System Lacking: No; Transportation Concerns: No; Advanced Directives: No; Patient does not want information on Advanced Directives; Do not resuscitate: No; Living Will: No; Medical Power of Attorney: No Physician Affirmation I have reviewed and agree with the above information. Electronic Signature(s) Signed: 11/08/2017 4:27:13 PM By: Evlyn KannerBritto, Donovyn Guidice MD, FACS Entered By: Evlyn KannerBritto, Alegandra Sommers on 11/08/2017 16:04:09 [place], [place] AMarland Kitchen. ([id number]) -------------------------------------------------------------------------------- SuperBill Details Patient Name: [place], [name] Date of Service: 11/08/2017 Medical Record Number: [id number] Patient Account Number: [id number] Date of Birth/Sex: 05/11/1956 (61 y.o. 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

## 2017-11-10 NOTE — Progress Notes (Signed)
[name], [name] ([id number]) Visit Report for 11/08/2017 Arrival Information Details Patient Name: [name], [name] Date of Service: 11/08/2017 3:00 PM Medical Record Number: [id number] Patient Account Number: [id number] Date of Birth/Sex: 1956/03/05 (61 y.o. Female) Treating RN: Renne CriglerFlinchum, Cheryl Primary Care Daryn Hicks: Karie FetchAYCOCK, NGWE Other Clinician: Referring Branko Steeves: Karie FetchAYCOCK, NGWE Treating Courtny Bennison/Extender: Rudene ReBritto, Errol Weeks in Treatment: 5 Visit Information History Since Last Visit Added or deleted any medications: No Patient Arrived: Ambulatory Any new allergies or adverse reactions: No Arrival Time: 14:53 Had a fall or experienced change in No Accompanied By: self activities of daily living that may affect Transfer Assistance: None risk of falls: Patient Identification Verified: Yes Signs or symptoms of abuse/neglect since last visito No Secondary Verification Process Yes Hospitalized since last visit: No Completed: Has Dressing in Place as Prescribed: Yes Patient Requires Transmission-Based No Pain Present Now: No Precautions: Patient Has Alerts: Yes Patient Alerts: Patient on Blood Thinner NOT DIABETIC Electronic Signature(s) Signed: 11/08/2017 3:24:39 PM By: Renne CriglerFlinchum, Cheryl Entered By: Renne CriglerFlinchum, Cheryl on 11/08/2017 14:53:21 [name], [name] -------------------------------------------------------------------------------- Compression Therapy Details Patient Name: [name], [name] Date of Service: 11/08/2017 3:00 PM Medical Record Number: [id number] Patient Account Number: [id number] Date of Birth/Sex: 1956/03/05 (61 y.o. Female) Treating RN: Renne CriglerFlinchum, Cheryl Primary Care Graydon Fofana: Karie FetchAYCOCK, NGWE Other Clinician: Referring Kolina Kube: Karie FetchAYCOCK, NGWE Treating Jenet Durio/Extender: Rudene ReBritto, Errol Weeks in Treatment: 5 Compression Therapy Performed for Wound Assessment: Wound #3 Left,Medial,Anterior Lower Leg Performed By: Clinician Renne CriglerFlinchum,  Cheryl, RN Compression Type: Three Layer Pre Treatment ABI: 1.2 Post Procedure Diagnosis Same as Pre-procedure Electronic Signature(s) Signed: 11/08/2017 4:43:52 PM By: Renne CriglerFlinchum, Cheryl Entered By: Renne CriglerFlinchum, Cheryl on 11/08/2017 15:24:28 Mcanany, [name] -------------------------------------------------------------------------------- Encounter Discharge Information Details Patient Name: [name] BeamEIMILLER, [name] Date of Service: 11/08/2017 3:00 PM Medical Record Number: [id number] Patient Account Number: [id number] Date of Birth/Sex: 1956/03/05 (61 y.o. Female) Treating RN: Primary Care Jamaine Quintin: Karie FetchAYCOCK, NGWE Other Clinician: Referring Mylinh Cragg: Karie FetchAYCOCK, NGWE Treating Tyrome Donatelli/Extender: Rudene ReBritto, Errol Weeks in Treatment: 5 Encounter Discharge Information Items Discharge Pain Level: 0 Discharge Condition: Stable Ambulatory Status: Ambulatory Discharge Destination: Home Transportation: Private Auto Accompanied By: self Schedule Follow-up Appointment: Yes Medication Reconciliation completed and Yes provided to Patient/Care Robertson Colclough: Patient Clinical Summary of Care: Declined Electronic Signature(s) Signed: 11/08/2017 4:43:52 PM By: Renne CriglerFlinchum, Cheryl Entered By: Renne CriglerFlinchum, Cheryl on 11/08/2017 15:25:35 [name], [name] ([id number]) -------------------------------------------------------------------------------- Lower Extremity Assessment Details Patient Name: [name], [name] Date of Service: 11/08/2017 3:00 PM Medical Record Number: [id number] Patient Account Number: [id number] Date of Birth/Sex: 1956/03/05 (61 y.o. Female) Treating RN: Renne CriglerFlinchum, Cheryl Primary Care Jacara Benito: Karie FetchAYCOCK, NGWE Other Clinician: Referring Merissa Renwick: Karie FetchAYCOCK, NGWE Treating Kadia Abaya/Extender: Rudene ReBritto, Errol Weeks in Treatment: 5 Edema Assessment Assessed: [Left: No] [Right: No] [Left: Edema] [Right: :] Calf Left: Right: Point of Measurement: 30 cm From Medial Instep 51.5 cm  cm Ankle Left: Right: Point of Measurement: 10 cm From Medial Instep 22 cm cm Vascular Assessment Pulses: Dorsalis Pedis Palpable: [Left:Yes] Posterior Tibial Extremity colors, hair growth, and conditions: Extremity Color: [Left:Hyperpigmented] Hair Growth on Extremity: [Left:No] Temperature of Extremity: [Left:Warm] Capillary Refill: [Left:< 3 seconds] Toe Nail Assessment Left: Right: Thick: No Discolored: No Deformed: No Improper Length and Hygiene: No Electronic Signature(s) Signed: 11/08/2017 3:24:39 PM By: Renne CriglerFlinchum, Cheryl Entered By: Renne CriglerFlinchum, Cheryl on 11/08/2017 14:56:40 [name], [name] ([id number]) -------------------------------------------------------------------------------- Multi Wound Chart Details Patient Name: [name], [name] Date of Service: 11/08/2017 3:00 PM Medical Record Number: [id number] Patient Account Number: [id number] Date of Birth/Sex: 1956/03/05 (61 y.o. Female) Treating RN: Renne CriglerFlinchum, Cheryl Primary Care Assunta Pupo: Letta PateAYCOCK,  NGWE Other Clinician: Referring Celia Friedland: Karie FetchAYCOCK, NGWE Treating Melrose Kearse/Extender: Rudene ReBritto, Errol Weeks in Treatment: 5 Vital Signs Height(in): 60 Pulse(bpm): 62 Weight(lbs): 230 Blood Pressure(mmHg): 163/74 Body Mass Index(BMI): 45 Temperature(F): 98.6 Respiratory Rate 16 (breaths/min): Photos: [N/A:N/A] Wound Location: Left Lower Leg - Medial, N/A N/A Anterior Wounding Event: Gradually Appeared N/A N/A Primary Etiology: Venous Leg Ulcer N/A N/A Comorbid History: Middle ear problems, N/A N/A Hypertension, Peripheral Venous Disease, Osteoarthritis Date Acquired: 07/29/2017 N/A N/A Weeks of Treatment: 5 N/A N/A Wound Status: Open N/A N/A Measurements L x W x D 2.2x2.5x0.1 N/A N/A (cm) Area (cm) : 4.32 N/A N/A Volume (cm) : 0.432 N/A N/A % Reduction in Area: -10.00% N/A N/A % Reduction in Volume: 45.00% N/A N/A Classification: Full Thickness Without N/A N/A Exposed Support Structures Exudate  Amount: Medium N/A N/A Exudate Type: Serosanguineous N/A N/A Exudate Color: red, brown N/A N/A Wound Margin: Flat and Intact N/A N/A Granulation Amount: Medium (34-66%) N/A N/A Granulation Quality: Red N/A N/A Necrotic Amount: Medium (34-66%) N/A N/A Necrotic Tissue: Eschar, Adherent Slough N/A N/A Exposed Structures: Fat Layer (Subcutaneous N/A N/A Tissue) Exposed: Yes Fascia: No Tendon: No Dysert, [name] ([id number]) Muscle: No Joint: No Bone: No Periwound Skin Texture: Excoriation: Yes N/A N/A Induration: No Callus: No Crepitus: No Rash: No Scarring: No Periwound Skin Moisture: Maceration: No N/A N/A Dry/Scaly: No Periwound Skin Color: Hemosiderin Staining: Yes N/A N/A Atrophie Blanche: No Cyanosis: No Ecchymosis: No Erythema: No Mottled: No Pallor: No Rubor: No Tenderness on Palpation: No N/A N/A Wound Preparation: Ulcer Cleansing: Not Cleansed N/A N/A Topical Anesthetic Applied: Other: lidocaine 4% Procedures Performed: Compression Therapy N/A N/A Treatment Notes Wound #3 (Left, Medial, Anterior Lower Leg) 1. Cleansed with: Clean wound with Normal Saline Cleanse wound with antibacterial soap and water 2. Anesthetic Topical Lidocaine 4% cream to wound bed prior to debridement 3. Peri-wound Care: Barrier cream 4. Dressing Applied: Other dressing (specify in notes) 5. Secondary Dressing Applied ABD Pad 7. Secured with 3 Layer Compression System - Left Lower Extremity Notes silvercell Electronic Signature(s) Signed: 11/08/2017 4:03:20 PM By: Evlyn KannerBritto, Errol MD, FACS Previous Signature: 11/08/2017 3:24:39 PM Version By: Renne CriglerFlinchum, Cheryl Entered By: Evlyn KannerBritto, Errol on 11/08/2017 16:03:19 Harps, [name] AMarland Kitchen. ([id number]) -------------------------------------------------------------------------------- Multi-Disciplinary Care Plan Details Patient Name: [name] BeamEIMILLER, [name] Date of Service: 11/08/2017 3:00 PM Medical Record Number: [id number] Patient  Account Number: [id number] Date of Birth/Sex: Jan 06, 1956 (61 y.o. Female) Treating RN: Renne CriglerFlinchum, Cheryl Primary Care Lanetta Figuero: Karie FetchAYCOCK, NGWE Other Clinician: Referring Phala Schraeder: Karie FetchAYCOCK, NGWE Treating Anayelli Lai/Extender: Rudene ReBritto, Errol Weeks in Treatment: 5 Active Inactive ` Orientation to the Wound Care Program Nursing Diagnoses: Knowledge deficit related to the wound healing center program Goals: Patient/caregiver will verbalize understanding of the Wound Healing Center Program Date Initiated: 10/04/2017 Target Resolution Date: 10/21/2017 Goal Status: Active Interventions: Provide education on orientation to the wound center Notes: ` Venous Leg Ulcer Nursing Diagnoses: Actual venous Insuffiency (use after diagnosis is confirmed) Goals: Patient will maintain optimal edema control Date Initiated: 10/04/2017 Target Resolution Date: 10/21/2017 Goal Status: Active Interventions: Assess peripheral edema status every visit. Treatment Activities: Non-invasive vascular studies : 10/04/2017 Notes: ` Wound/Skin Impairment Nursing Diagnoses: Impaired tissue integrity Goals: Ulcer/skin breakdown will have a volume reduction of 30% by week 4 Date Initiated: 10/04/2017 Target Resolution Date: 11/04/2017 [name], [name] ([id number]) Goal Status: Active Interventions: Assess ulceration(s) every visit Treatment Activities: Skin care regimen initiated : 10/04/2017 Topical wound management initiated : 10/04/2017 Notes: Electronic Signature(s) Signed: 11/08/2017 3:24:39 PM By: Renne CriglerFlinchum, Cheryl Entered By: Renne CriglerFlinchum, Cheryl  on 11/08/2017 15:06:57 [name], [name] -------------------------------------------------------------------------------- Pain Assessment Details Patient Name: [name], [name] Date of Service: 11/08/2017 3:00 PM Medical Record Number: [id number] Patient Account Number: [id number] Date of Birth/Sex: 11/25/1956 (61 y.o. Female) Treating RN:  Renne Crigler Primary Care Ellorie Kindall: Karie Fetch Other Clinician: Referring Allin Frix: Karie Fetch Treating Shenay Torti/Extender: Rudene Re in Treatment: 5 Active Problems Location of Pain Severity and Description of Pain Patient Has Paino No Site Locations With Dressing Change: No Pain Management and Medication Current Pain Management: Goals for Pain Management Topical or injectable lidocaine is offered to patient for acute pain when surgical debridement is performed. If needed, Patient is instructed to use over the counter pain medication for the following 24-48 hours after debridement. Wound care MDs do not prescribed pain medications. Patient has chronic pain or uncontrolled pain. Patient has been instructed to make an appointment with their Primary Care Physician for pain management. Electronic Signature(s) Signed: 11/08/2017 3:24:39 PM By: Renne Crigler Entered By: Renne Crigler on 11/08/2017 14:53:29 [name], [name] ([id number]) -------------------------------------------------------------------------------- Patient/Caregiver Education Details Patient Name: [name], [name] Date of Service: 11/08/2017 3:00 PM Medical Record Number: [id number] Patient Account Number: [id number] Date of Birth/Gender: [birth year] (61 y.o. Female) Treating RN: Renne Crigler Primary Care Physician: Karie Fetch Other Clinician: Referring Physician: Karie Fetch Treating Physician/Extender: Rudene Re in Treatment: 5 Education Assessment Education Provided To: Patient Education Topics Provided Wound/Skin Impairment: Handouts: Caring for Your Ulcer Methods: Demonstration, Explain/Verbal Responses: State content correctly Electronic Signature(s) Signed: 11/08/2017 4:43:52 PM By: Renne Crigler Entered By: Renne Crigler on 11/08/2017 15:25:51 [name], [name]  ([id number]) -------------------------------------------------------------------------------- Wound Assessment Details Patient Name: [name], [name] Date of Service: 11/08/2017 3:00 PM Medical Record Number: [id number] Patient Account Number: [id number] Date of Birth/Sex: [birth year] (61 y.o. Female) Treating RN: Renne Crigler Primary Care Charlton Boule: Karie Fetch Other Clinician: Referring Sri Clegg: Karie Fetch Treating Kamya Watling/Extender: Rudene Re in Treatment: 5 Wound Status Wound Number: 3 Primary Venous Leg Ulcer Etiology: Wound Location: Left Lower Leg - Medial, Anterior Wound Open Wounding Event: Gradually Appeared Status: Date Acquired: 07/29/2017 Comorbid Middle ear problems, Hypertension, Peripheral Weeks Of Treatment: 5 History: Venous Disease, Osteoarthritis Clustered Wound: No Photos Wound Measurements Length: (cm) 2.2 Width: (cm) 2.5 Depth: (cm) 0.1 Area: (cm) 4.32 Volume: (cm) 0.432 % Reduction in Area: -10% % Reduction in Volume: 45% Tunneling: No Undermining: No Wound Description Full Thickness Without Exposed Support Classification: Structures Wound Margin: Flat and Intact Exudate Medium Amount: Exudate Type: Serosanguineous Exudate Color: red, brown Foul Odor After Cleansing: No Slough/Fibrino No Wound Bed Granulation Amount: Medium (34-66%) Exposed Structure Granulation Quality: Red Fascia Exposed: No Necrotic Amount: Medium (34-66%) Fat Layer (Subcutaneous Tissue) Exposed: Yes Necrotic Quality: Eschar, Adherent Slough Tendon Exposed: No Muscle Exposed: No Joint Exposed: No Bone Exposed: No Periwound Skin Texture Texture Color No Abnormalities Noted: No No Abnormalities Noted: No Tailor, [name] ([id number]) Callus: No Atrophie Blanche: No Crepitus: No Cyanosis: No Excoriation: Yes Ecchymosis: No Induration: No Erythema: No Rash: No Hemosiderin Staining: Yes Scarring: No Mottled: No Pallor:  No Moisture Rubor: No No Abnormalities Noted: No Dry / Scaly: No Maceration: No Wound Preparation Ulcer Cleansing: Not Cleansed Topical Anesthetic Applied: Other: lidocaine 4%, Treatment Notes Wound #3 (Left, Medial, Anterior Lower Leg) 1. Cleansed with: Clean wound with Normal Saline Cleanse wound with antibacterial soap and water 2. Anesthetic Topical Lidocaine 4% cream to wound bed prior to debridement 3. Peri-wound Care: Barrier cream 4. Dressing Applied: Other dressing (specify in notes) 5. Secondary Dressing  Applied ABD Pad 7. Secured with 3 Layer Compression System - Left Lower Extremity Notes silvercell Electronic Signature(s) Signed: 11/08/2017 3:24:39 PM By: Renne Crigler Entered By: Renne Crigler on 11/08/2017 14:55:32 Hickam, [name] ([id number]) -------------------------------------------------------------------------------- Vitals Details Patient Name: [name] Beam, [name] Date of Service: 11/08/2017 3:00 PM Medical Record Number: [id number] Patient Account Number: [id number] Date of Birth/Sex: November 24, 1956 (61 y.o. Female) Treating RN: Renne Crigler Primary Care Elmyra Banwart: Karie Fetch Other Clinician: Referring Gyan Cambre: Karie Fetch Treating Bradey Luzier/Extender: Rudene Re in Treatment: 5 Vital Signs Time Taken: 14:53 Temperature (F): 98.6 Height (in): 60 Pulse (bpm): 62 Weight (lbs): 230 Respiratory Rate (breaths/min): 16 Body Mass Index (BMI): 44.9 Blood Pressure (mmHg): 163/74 Reference Range: 80 - 120 mg / dl Electronic Signature(s) Signed: 11/08/2017 3:24:39 PM By: Renne Crigler Entered By: Renne Crigler on 11/08/2017 14:53:49

## 2017-11-12 ENCOUNTER — Encounter: Payer: Self-pay | Admitting: Vascular Surgery

## 2017-11-12 ENCOUNTER — Ambulatory Visit (INDEPENDENT_AMBULATORY_CARE_PROVIDER_SITE_OTHER): Payer: BLUE CROSS/BLUE SHIELD | Admitting: Vascular Surgery

## 2017-11-12 VITALS — BP 138/88 | HR 86 | Temp 97.7°F | Resp 18 | Ht 61.0 in | Wt 230.0 lb

## 2017-11-12 DIAGNOSIS — I83892 Varicose veins of left lower extremities with other complications: Secondary | ICD-10-CM | POA: Diagnosis not present

## 2017-11-12 HISTORY — PX: ENDOVENOUS ABLATION SAPHENOUS VEIN W/ LASER: SUR449

## 2017-11-12 NOTE — Progress Notes (Signed)
Laser Ablation Procedure    Date: 11/12/2017   [name] DOB:July 30, 1956  Consent signed: Yes    Surgeon:  Dr. Quita SkyeJames D. Hart RochesterLawson  Procedure: Laser Ablation: left Greater Saphenous Vein  BP 138/88 (BP Location: Left Arm, Patient Position: Sitting, Cuff Size: Large)   Pulse 86   Temp 97.7 F (36.5 C) (Oral)   Resp 18   Ht 5\' 1"  (1.549 m)   Wt 230 lb (104.3 kg)   SpO2 98%   BMI 43.46 kg/m   Tumescent Anesthesia: 425 cc 0.9% NaCl with 50 cc Lidocaine HCL and 15 cc 8.4% NaHCO3  Local Anesthesia: 6 cc Lidocaine HCL and NaHCO3 (ratio 2:1)  Pulsed Mode: 18 watts, 500ms delay, 1.0 duration  Total Energy:  2066  Joules          Total Pulses: 116               Total Time: 1:55   Patient tolerated procedure well    Description of Procedure:  After marking the course of the secondary varicosities, the patient was placed on the operating table in the supine position, and the left leg was prepped and draped in sterile fashion.   Local anesthetic was administered and under ultrasound guidance the saphenous vein was accessed with a micro needle and guide wire; then the mirco puncture sheath was placed.  A guide wire was inserted saphenofemoral junction , followed by a 5 french sheath.  The position of the sheath and then the laser fiber below the junction was confirmed using the ultrasound.  Tumescent anesthesia was administered along the course of the saphenous vein using ultrasound guidance. The patient was placed in Trendelenburg position and protective laser glasses were placed on patient and staff, and the laser was fired at 15 watts continuous mode advancing 1-402mm/second for a total of 2066 joules.     Steri strip was applied to the IV insertion site and ABD pads and  Ace wrap bandages were applied over the left thigh and at the top of the saphenofemoral junction. Multi-layer knee high compression wrap applied by wound care center remains on left calf. Blood loss was less than 15  cc.  The patient ambulated out of the operating room having tolerated the procedure well.

## 2017-11-12 NOTE — Progress Notes (Signed)
Subjective:     Patient ID: [name], female   DOB: [birth year], 61 y.o.   MRN: [id number]  HPI This 61 year old female had laser ablation of the great saphenous vein left leg from the distal thigh to near the saphenofemoral junction. I attempted to start in the proximal calf but because of the tortuosity of the great saphenous vein the guidewire would not traverse this area therefore had to begin in the distal thigh. Energy was turned up to 18 W and total energy utilized was 2066 J. Patient tolerated the procedure well. It was done or local tumescent anesthesia.  Review of Systems     Objective:   Physical Exam BP 138/88 (BP Location: Left Arm, Patient Position: Sitting, Cuff Size: Large)   Pulse 86   Temp 97.7 F (36.5 C) (Oral)   Resp 18   Ht 5\' 1"  (1.549 m)   Wt 230 lb (104.3 kg)   SpO2 98%   BMI 43.46 kg/m        Assessment:     Well-tolerated laser ablation left great saphenous vein from distal thigh to near the saphenofemoral junction performed under local tumescent anesthesia    Plan:     Patient return in 1 week for venous duplex exam to confirm closure of left great saphenous vein Patient understands that this may not be successful. [place] and did not close the vein. We did use higher energy levels today and hopefully this will be a successful ablation but she understands may not be successful.

## 2017-11-13 ENCOUNTER — Encounter: Payer: Self-pay | Admitting: Vascular Surgery

## 2017-11-18 ENCOUNTER — Ambulatory Visit (HOSPITAL_COMMUNITY)
Admission: RE | Admit: 2017-11-18 | Discharge: 2017-11-18 | Disposition: A | Discharge status: Home / Self Care | Payer: BLUE CROSS/BLUE SHIELD | Source: Ambulatory Visit | Attending: Vascular Surgery | Admitting: Vascular Surgery

## 2017-11-18 DIAGNOSIS — I83222 Varicose veins of left lower extremity with both ulcer of calf and inflammation: Secondary | ICD-10-CM | POA: Diagnosis not present

## 2017-11-18 DIAGNOSIS — Z9889 Other specified postprocedural states: Secondary | ICD-10-CM | POA: Diagnosis not present

## 2017-11-18 DIAGNOSIS — I83892 Varicose veins of left lower extremities with other complications: Secondary | ICD-10-CM | POA: Diagnosis not present

## 2017-11-19 ENCOUNTER — Ambulatory Visit (INDEPENDENT_AMBULATORY_CARE_PROVIDER_SITE_OTHER): Payer: BLUE CROSS/BLUE SHIELD | Admitting: Vascular Surgery

## 2017-11-19 ENCOUNTER — Encounter: Payer: Self-pay | Admitting: Vascular Surgery

## 2017-11-19 VITALS — BP 118/77 | HR 68 | Temp 97.0°F | Resp 18 | Ht 61.0 in | Wt 230.0 lb

## 2017-11-19 DIAGNOSIS — I83892 Varicose veins of left lower extremities with other complications: Secondary | ICD-10-CM

## 2017-11-19 NOTE — Progress Notes (Signed)
Subjective:     Patient ID: [name], female   DOB: 01-14-1956, 61 y.o.   MRN: [id number]  HPI This 61 year old female returns 1 week post-laser ablation of the large caliber left great saphenous vein for recurrent venous stasis ulcers in the left ankle. She had had one previous attempt closure and [place] about 3-4 years ago which was unsuccessful. We did use increased energy level on this occasion. She states that she has noticed slight improvement in the swelling in the ankle. The wound center changed her dressing yesterday and the ulcer in the left ankle was approximately 2-1/2 cm in diameter and improving.  Past Medical History:  Diagnosis Date  . Injury, blood vessel     Social History   Tobacco Use  . Smoking status: Never Smoker  . Smokeless tobacco: Never Used  Substance Use Topics  . Alcohol use: No    History reviewed. No pertinent family history.  Allergies  Allergen Reactions  . Codeine   . Septra Ds [Sulfamethoxazole-Trimethoprim] Other (See Comments)    Nausea   And stomach burning  . Tramadol Other (See Comments)    Makes me feel loopy       Current Outpatient Medications:  .  acetaminophen (TYLENOL) 325 MG tablet, Take 650 mg by mouth every 6 (six) hours as needed., Disp: , Rfl:  .  naproxen (NAPROSYN) 500 MG tablet, Take 1 tablet (500 mg total) by mouth 2 (two) times daily with a meal., Disp: 60 tablet, Rfl: 0 .  doxycycline (VIBRAMYCIN) 100 MG capsule, Take 1 capsule (100 mg total) by mouth 2 (two) times daily. (Patient not taking: Reported on 10/28/2017), Disp: 20 capsule, Rfl: 0 .  metaxalone (SKELAXIN) 800 MG tablet, Take 1 tablet (800 mg total) by mouth 3 (three) times daily. Used for muscle spasm (Patient not taking: Reported on 10/28/2017), Disp: 21 tablet, Rfl: 0 .  mupirocin ointment (BACTROBAN) 2 %, Apply 1 application topically 3 (three) times daily. (Patient not taking: Reported on 10/28/2017), Disp: 22 g, Rfl: 0 .  naproxen (NAPROSYN) 500  MG tablet, Take 1 tablet (500 mg total) by mouth 2 (two) times daily. (Patient not taking: Reported on 10/28/2017), Disp: 30 tablet, Rfl: 0 .  ondansetron (ZOFRAN ODT) 8 MG disintegrating tablet, Take 1 tablet (8 mg total) by mouth every 8 (eight) hours as needed. (Patient not taking: Reported on 10/28/2017), Disp: 6 tablet, Rfl: 0 .  traMADol (ULTRAM) 50 MG tablet, Take 1 tablet (50 mg total) by mouth every 6 (six) hours as needed. (Patient not taking: Reported on 10/28/2017), Disp: 20 tablet, Rfl: 0  Vitals:   11/19/17 1353  BP: 118/77  Pulse: 68  Resp: 18  Temp: (!) 97 F (36.1 C)  TempSrc: Oral  SpO2: 95%  Weight: 230 lb (104.3 kg)  Height: 5\' 1"  (1.549 m)    Body mass index is 43.46 kg/m.         Review of Systems Denies chest pain, dyspnea on exertion. Ambulates slowly because of her obesity    Objective:   Physical Exam BP 118/77 (BP Location: Right Arm, Patient Position: Sitting, Cuff Size: Normal)   Pulse 68   Temp (!) 97 F (36.1 C) (Oral)   Resp 18   Ht 5\' 1"  (1.549 m)   Wt 230 lb (104.3 kg)   SpO2 95%   BMI 43.46 kg/m   Gen. morbidly obese female no apparent distress alert and oriented 3 Lungs no rhonchi or wheezing Left leg with mild  discomfort to deep palpation in the thigh area up to the inguinal crease with minimal erythema. Dressing in tact lower half left leg.  Today I ordered a venous duplex exam the left leg which I reviewed and interpreted and also performed a bedside SonoSite ultrasound exam. The left great saphenous vein is totally occluded up to just proximal to the saphenofemoral junction. It is completely noncompressible throughout the thigh area. There is no DVT     Assessment:     Successful laser ablation large caliber high flow left great saphenous vein in patient with recurrent venous stasis ulcer who had previous attempt at laser closure and [place] 3-4 years ago which was unsuccessful    Plan:     #1 Continue Wound Ctr. weekly  until ulcer is healed #2 short leg elastic compression stocking on a chronic basis #3 return to see me on a when necessary basis

## 2017-11-20 DIAGNOSIS — I83222 Varicose veins of left lower extremity with both ulcer of calf and inflammation: Secondary | ICD-10-CM | POA: Diagnosis not present

## 2017-11-20 NOTE — Progress Notes (Signed)
Brahmbhatt, [name] ([id number]) Visit Report for 11/18/2017 Arrival Information Details Patient Name: [name], [name] Date of Service: 11/18/2017 12:30 PM Medical Record Number: [id number] Patient Account Number: [id number] Date of Birth/Sex: Sep 30, 1956 (61 y.o. Female) Treating RN: Renne Crigler Primary Care Hagar Sadiq: Karie Fetch Other Clinician: Referring Jru Pense: Karie Fetch Treating Bentley Fissel/Extender: Rudene Re in Treatment: 6 Visit Information History Since Last Visit All ordered tests and consults were completed: No Patient Arrived: Ambulatory Any new allergies or adverse reactions: No Arrival Time: 12:35 Had a fall or experienced change in No Accompanied By: self activities of daily living that may affect Transfer Assistance: None risk of falls: Patient Requires Transmission-Based No Signs or symptoms of abuse/neglect since last visito No Precautions: Hospitalized since last visit: Yes Patient Has Alerts: Yes Pain Present Now: No Patient Alerts: Patient on Blood Thinner NOT DIABETIC Electronic Signature(s) Signed: 11/19/2017 4:40:58 PM By: Renne Crigler Entered By: Renne Crigler on 11/18/2017 12:36:43 [name], [name] ([id number]) -------------------------------------------------------------------------------- Clinic Level of Care Assessment Details Patient Name: Garbutt, [name] Date of Service: 11/18/2017 12:30 PM Medical Record Number: [id number] Patient Account Number: [id number] Date of Birth/Sex: 1956-08-30 (61 y.o. Female) Treating RN: Renne Crigler Primary Care Elpidio Thielen: Karie Fetch Other Clinician: Referring Midas Daughety: Karie Fetch Treating Kevan Prouty/Extender: Rudene Re in Treatment: 6 Clinic Level of Care Assessment Items TOOL 4 Quantity Score []  - Use when only an EandM is performed on FOLLOW-UP visit 0 ASSESSMENTS - Nursing Assessment / Reassessment X - Reassessment of Co-morbidities (includes updates  in patient status) 1 10 X- 1 5 Reassessment of Adherence to Treatment Plan ASSESSMENTS - Wound and Skin Assessment / Reassessment X - Simple Wound Assessment / Reassessment - one wound 1 5 []  - 0 Complex Wound Assessment / Reassessment - multiple wounds []  - 0 Dermatologic / Skin Assessment (not related to wound area) ASSESSMENTS - Focused Assessment []  - Circumferential Edema Measurements - multi extremities 0 []  - 0 Nutritional Assessment / Counseling / Intervention []  - 0 Lower Extremity Assessment (monofilament, tuning fork, pulses) []  - 0 Peripheral Arterial Disease Assessment (using hand held doppler) ASSESSMENTS - Ostomy and/or Continence Assessment and Care []  - Incontinence Assessment and Management 0 []  - 0 Ostomy Care Assessment and Management (repouching, etc.) PROCESS - Coordination of Care X - Simple Patient / Family Education for ongoing care 1 15 []  - 0 Complex (extensive) Patient / Family Education for ongoing care []  - 0 Staff obtains Chiropractor, Records, Test Results / Process Orders []  - 0 Staff telephones HHA, Nursing Homes / Clarify orders / etc []  - 0 Routine Transfer to another Facility (non-emergent condition) []  - 0 Routine Hospital Admission (non-emergent condition) []  - 0 New Admissions / Manufacturing engineer / Ordering NPWT, Apligraf, etc. []  - 0 Emergency Hospital Admission (emergent condition) X- 1 10 Simple Discharge Coordination [name], [name] ([id number]) []  - 0 Complex (extensive) Discharge Coordination PROCESS - Special Needs []  - Pediatric / Minor Patient Management 0 []  - 0 Isolation Patient Management []  - 0 Hearing / Language / Visual special needs []  - 0 Assessment of Community assistance (transportation, D/C planning, etc.) []  - 0 Additional assistance / Altered mentation []  - 0 Support Surface(s) Assessment (bed, cushion, seat, etc.) INTERVENTIONS - Wound Cleansing / Measurement X - Simple Wound Cleansing - one  wound 1 5 []  - 0 Complex Wound Cleansing - multiple wounds X- 1 5 Wound Imaging (photographs - any number of wounds) []  - 0 Wound Tracing (instead of photographs) X- 1 5 Simple  Wound Measurement - one wound []  - 0 Complex Wound Measurement - multiple wounds INTERVENTIONS - Wound Dressings []  - Small Wound Dressing one or multiple wounds 0 X- 1 15 Medium Wound Dressing one or multiple wounds []  - 0 Large Wound Dressing one or multiple wounds []  - 0 Application of Medications - topical []  - 0 Application of Medications - injection INTERVENTIONS - Miscellaneous []  - External ear exam 0 []  - 0 Specimen Collection (cultures, biopsies, blood, body fluids, etc.) []  - 0 Specimen(s) / Culture(s) sent or taken to Lab for analysis []  - 0 Patient Transfer (multiple staff / Nurse, adultHoyer Lift / Similar devices) []  - 0 Simple Staple / Suture removal (25 or less) []  - 0 Complex Staple / Suture removal (26 or more) []  - 0 Hypo / Hyperglycemic Management (close monitor of Blood Glucose) []  - 0 Ankle / Brachial Index (ABI) - do not check if billed separately X- 1 5 Vital Signs Shenk, [name] ([id number]) Has the patient been seen at the hospital within the last three years: Yes Total Score: 80 Level Of Care: New/Established - Level 3 Electronic Signature(s) Signed: 11/19/2017 4:40:58 PM By: Renne CriglerFlinchum, Cheryl Entered By: Renne CriglerFlinchum, Cheryl on 11/18/2017 13:05:05 Gurr, [name] ([id number]) -------------------------------------------------------------------------------- Encounter Discharge Information Details Patient Name: [name], [name] Date of Service: 11/18/2017 12:30 PM Medical Record Number: [id number] Patient Account Number: [id number] Date of Birth/Sex: 1956/01/26 (61 y.o. Female) Treating RN: Renne CriglerFlinchum, Cheryl Primary Care Quincie Haroon: Karie FetchAYCOCK, NGWE Other Clinician: Referring Sartaj Hoskin: Karie FetchAYCOCK, NGWE Treating Dearis Danis/Extender: Rudene ReBritto, Errol Weeks in Treatment:  6 Encounter Discharge Information Items Discharge Pain Level: 0 Discharge Condition: Stable Ambulatory Status: Ambulatory Discharge Destination: Home Private Transportation: Auto Accompanied By: self Schedule Follow-up Appointment: Yes Medication Reconciliation completed and No provided to Patient/Care Neilan Rizzo: Clinical Summary of Care: Electronic Signature(s) Signed: 11/19/2017 4:40:58 PM By: Renne CriglerFlinchum, Cheryl Entered By: Renne CriglerFlinchum, Cheryl on 11/18/2017 13:04:18 Roye, [name] ([id number]) -------------------------------------------------------------------------------- Patient/Caregiver Education Details Patient Name: Belton, [name] Date of Service: 11/18/2017 12:30 PM Medical Record Number: [id number] Patient Account Number: [id number] Date of Birth/Gender: 1956/01/26 (61 y.o. Female) Treating RN: Renne CriglerFlinchum, Cheryl Primary Care Physician: Karie FetchAYCOCK, NGWE Other Clinician: Referring Physician: Karie FetchAYCOCK, NGWE Treating Physician/Extender: Rudene ReBritto, Errol Weeks in Treatment: 6 Education Assessment Education Provided To: Patient Education Topics Provided Wound/Skin Impairment: Handouts: Caring for Your Ulcer Methods: Demonstration Responses: State content correctly Electronic Signature(s) Signed: 11/19/2017 4:40:58 PM By: Renne CriglerFlinchum, Cheryl Entered By: Renne CriglerFlinchum, Cheryl on 11/18/2017 13:03:56 Onorato, [name] ([id number]) -------------------------------------------------------------------------------- Wound Assessment Details Patient Name: Rodier, [name] Date of Service: 11/18/2017 12:30 PM Medical Record Number: [id number] Patient Account Number: [id number] Date of Birth/Sex: 1956/01/26 (61 y.o. Female) Treating RN: Renne CriglerFlinchum, Cheryl Primary Care Aryona Sill: Karie FetchAYCOCK, NGWE Other Clinician: Referring Fremont Skalicky: Karie FetchAYCOCK, NGWE Treating Hodari Chuba/Extender: Rudene ReBritto, Errol Weeks in Treatment: 6 Wound Status Wound Number: 3 Primary Venous Leg Ulcer Etiology: Wound  Location: Left Lower Leg - Medial, Anterior Wound Open Wounding Event: Gradually Appeared Status: Date Acquired: 07/29/2017 Comorbid Middle ear problems, Hypertension, Weeks Of Treatment: 6 History: Peripheral Venous Disease, Osteoarthritis Clustered Wound: No Photos Wound Measurements Length: (cm) 2 Width: (cm) 1.9 Depth: (cm) 0.2 Area: (cm) 2.985 Volume: (cm) 0.597 % Reduction in Area: 24% % Reduction in Volume: 23.9% Epithelialization: Small (1-33%) Tunneling: No Undermining: No Wound Description Full Thickness Without Exposed Support Foul Od Classification: Structures Slough/ Wound Margin: Flat and Intact Exudate Medium Amount: Exudate Type: Serosanguineous Exudate Color: red, brown or After Cleansing: No Fibrino No Wound Bed Granulation Amount: Medium (34-66%) Exposed Structure Granulation Quality: Red  Fascia Exposed: No Necrotic Amount: Medium (34-66%) Fat Layer (Subcutaneous Tissue) Exposed: Yes Necrotic Quality: Eschar, Adherent Slough Tendon Exposed: No Muscle Exposed: No Joint Exposed: No Bone Exposed: No Periwound Skin Texture Texture Color No Abnormalities Noted: No No Abnormalities Noted: No Mizell, [name] ([id number]) Callus: No Atrophie Blanche: No Crepitus: No Cyanosis: No Excoriation: Yes Ecchymosis: No Induration: No Erythema: No Rash: No Hemosiderin Staining: Yes Scarring: No Mottled: No Pallor: No Moisture Rubor: No No Abnormalities Noted: No Dry / Scaly: No Maceration: No Wound Preparation Ulcer Cleansing: Not Cleansed Topical Anesthetic Applied: None Treatment Notes Wound #3 (Left, Medial, Anterior Lower Leg) 1. Cleansed with: Clean wound with Normal Saline 3. Peri-wound Care: Barrier cream 4. Dressing Applied: Other dressing (specify in notes) 5. Secondary Dressing Applied ABD Pad 7. Secured with 3 Layer Compression System - Left Lower Extremity Notes silvercell Electronic Signature(s) Signed:  11/19/2017 4:40:58 PM By: Renne CriglerFlinchum, Cheryl Entered By: Renne CriglerFlinchum, Cheryl on 11/18/2017 13:18:34

## 2017-11-21 NOTE — Progress Notes (Signed)
Selsor, [name] ([id number]) Visit Report for 11/20/2017 Arrival Information Details Patient Name: [name], [name] Date of Service: 11/20/2017 12:45 PM Medical Record Number: [id number] Patient Account Number: [id number] Date of Birth/Sex: 09-18-1956 (61 y.o. Female) Treating RN: Huel CoventryWoody, Kim Primary Care Haeven Nickle: Karie FetchAYCOCK, NGWE Other Clinician: Referring Amador Braddy: Karie FetchAYCOCK, NGWE Treating Donnamarie Shankles/Extender: Altamese CarolinaOBSON, MICHAEL G Weeks in Treatment: 6 Visit Information History Since Last Visit Added or deleted any medications: No Patient Arrived: Ambulatory Any new allergies or adverse reactions: No Arrival Time: 13:38 Had a fall or experienced change in No Accompanied By: self activities of daily living that may affect Transfer Assistance: None risk of falls: Patient Identification Verified: Yes Signs or symptoms of abuse/neglect since last visito No Secondary Verification Process Yes Hospitalized since last visit: No Completed: Has Dressing in Place as Prescribed: Yes Patient Requires Transmission-Based No Pain Present Now: No Precautions: Patient Has Alerts: Yes Patient Alerts: Patient on Blood Thinner NOT DIABETIC Electronic Signature(s) Signed: 11/20/2017 5:35:02 PM By: Elliot GurneyWoody, BSN, RN, CWS, Kim RN, BSN Entered By: Elliot GurneyWoody, BSN, RN, CWS, Kim on 11/20/2017 13:38:43 Hollick, [name] -------------------------------------------------------------------------------- Compression Therapy Details Patient Name: Burzynski, [name] Date of Service: 11/20/2017 12:45 PM Medical Record Number: [id number] Patient Account Number: [id number] Date of Birth/Sex: 09-18-1956 (61 y.o. Female) Treating RN: Huel CoventryWoody, Kim Primary Care Bernetta Sutley: Karie FetchAYCOCK, NGWE Other Clinician: Referring Clell Trahan: Karie FetchAYCOCK, NGWE Treating Dulse Rutan/Extender: Altamese CarolinaOBSON, MICHAEL G Weeks in Treatment: 6 Compression Therapy Performed for Wound Assessment: Wound #3 Left,Medial,Anterior Lower Leg Performed  By: Clinician Huel CoventryWoody, Kim, RN Compression Type: Three Layer Pre Treatment ABI: 1.2 Electronic Signature(s) Signed: 11/20/2017 5:35:02 PM By: Elliot GurneyWoody, BSN, RN, CWS, Kim RN, BSN Entered By: Elliot GurneyWoody, BSN, RN, CWS, Kim on 11/20/2017 13:55:03 Garritano, [name] -------------------------------------------------------------------------------- Encounter Discharge Information Details Patient Name: Murren, [name] Date of Service: 11/20/2017 12:45 PM Medical Record Number: [id number] Patient Account Number: [id number] Date of Birth/Sex: 09-18-1956 (61 y.o. Female) Treating RN: Huel CoventryWoody, Kim Primary Care Haaris Metallo: Karie FetchAYCOCK, NGWE Other Clinician: Referring Gilliam Hawkes: Karie FetchAYCOCK, NGWE Treating Nevada Kirchner/Extender: Altamese CarolinaOBSON, MICHAEL G Weeks in Treatment: 6 Encounter Discharge Information Items Discharge Pain Level: 0 Discharge Condition: Stable Ambulatory Status: Ambulatory Discharge Destination: Home Private Transportation: Auto Accompanied By: self Schedule Follow-up Appointment: Yes Medication Reconciliation completed and Yes provided to Patient/Care Grey Rakestraw: Clinical Summary of Care: Electronic Signature(s) Signed: 11/20/2017 5:35:02 PM By: Elliot GurneyWoody, BSN, RN, CWS, Kim RN, BSN Entered By: Elliot GurneyWoody, BSN, RN, CWS, Kim on 11/20/2017 13:56:04 Azad, [name] ([id number]) -------------------------------------------------------------------------------- Patient/Caregiver Education Details Patient Name: Hamm, [name] Date of Service: 11/20/2017 12:45 PM Medical Record Number: [id number] Patient Account Number: [id number] Date of Birth/Gender: 09-18-1956 (61 y.o. Female) Treating RN: Huel CoventryWoody, Kim Primary Care Physician: Karie FetchAYCOCK, NGWE Other Clinician: Referring Physician: Karie FetchAYCOCK, NGWE Treating Physician/Extender: Altamese CarolinaOBSON, MICHAEL G Weeks in Treatment: 6 Education Assessment Education Provided To: Patient Education Topics Provided Venous: Handouts: Controlling Swelling with  Multilayered Compression Wraps Methods: Demonstration, Explain/Verbal Responses: State content correctly Electronic Signature(s) Signed: 11/20/2017 5:35:02 PM By: Elliot GurneyWoody, BSN, RN, CWS, Kim RN, BSN Entered By: Elliot GurneyWoody, BSN, RN, CWS, Kim on 11/20/2017 13:55:49 Gorter, [name] AMarland Kitchen. ([id number]) -------------------------------------------------------------------------------- Wound Assessment Details Patient Name: Warf, [name] Date of Service: 11/20/2017 12:45 PM Medical Record Number: [id number] Patient Account Number: [id number] Date of Birth/Sex: 09-18-1956 (61 y.o. Female) Treating RN: Huel CoventryWoody, Kim Primary Care Lathen Seal: Karie FetchAYCOCK, NGWE Other Clinician: Referring Vedika Dumlao: Karie FetchAYCOCK, NGWE Treating Abdulrahim Siddiqi/Extender: Altamese CarolinaOBSON, MICHAEL G Weeks in Treatment: 6 Wound Status Wound Number: 3 Primary Etiology: Venous Leg Ulcer Wound Location: Left, Medial, Anterior Lower Leg Wound Status:  Open Wounding Event: Gradually Appeared Date Acquired: 07/29/2017 Weeks Of Treatment: 6 Clustered Wound: No Wound Measurements Length: (cm) 1.5 Width: (cm) 1.8 Depth: (cm) 0.2 Area: (cm) 2.121 Volume: (cm) 0.424 % Reduction in Area: 46% % Reduction in Volume: 46% Wound Description Full Thickness Without Exposed Support Classification: Structures Periwound Skin Texture Texture Color No Abnormalities Noted: No No Abnormalities Noted: No Moisture No Abnormalities Noted: No Treatment Notes Wound #3 (Left, Medial, Anterior Lower Leg) 1. Cleansed with: Cleanse wound with antibacterial soap and water 4. Dressing Applied: Prisma Ag 5. Secondary Dressing Applied ABD Pad 7. Secured with 3 Layer Compression System - Left Lower Extremity Electronic Signature(s) Signed: 11/20/2017 5:35:02 PM By: Elliot GurneyWoody, BSN, RN, CWS, Kim RN, BSN Entered By: Elliot GurneyWoody, BSN, RN, CWS, Kim on 11/20/2017 13:50:20

## 2017-11-25 ENCOUNTER — Encounter: Payer: BLUE CROSS/BLUE SHIELD | Attending: Physician Assistant | Admitting: Physician Assistant

## 2017-11-25 DIAGNOSIS — Z6841 Body Mass Index (BMI) 40.0 and over, adult: Secondary | ICD-10-CM | POA: Insufficient documentation

## 2017-11-25 DIAGNOSIS — H9192 Unspecified hearing loss, left ear: Secondary | ICD-10-CM | POA: Diagnosis not present

## 2017-11-25 DIAGNOSIS — Z87891 Personal history of nicotine dependence: Secondary | ICD-10-CM | POA: Diagnosis not present

## 2017-11-25 DIAGNOSIS — M1990 Unspecified osteoarthritis, unspecified site: Secondary | ICD-10-CM | POA: Diagnosis not present

## 2017-11-25 DIAGNOSIS — I83222 Varicose veins of left lower extremity with both ulcer of calf and inflammation: Secondary | ICD-10-CM | POA: Insufficient documentation

## 2017-11-25 DIAGNOSIS — I10 Essential (primary) hypertension: Secondary | ICD-10-CM | POA: Diagnosis not present

## 2017-11-25 DIAGNOSIS — L97222 Non-pressure chronic ulcer of left calf with fat layer exposed: Secondary | ICD-10-CM | POA: Diagnosis not present

## 2017-11-26 NOTE — Progress Notes (Signed)
Scarpulla, [name] ([id number]) Visit Report for 11/25/2017 Arrival Information Details Patient Name: [name], [name] Date of Service: 11/25/2017 1:45 PM Medical Record Number: [id number] Patient Account Number: [id number] Date of Birth/Sex: 1955-12-29 (61 y.o. Female) Treating [name]: [name] Primary Care [name]: [name] Other [name]: Referring Lynna Zamorano: [name] Treating Sharonda Llamas/Extender: [name], [name] Visit Information History Since Last Visit All ordered tests and consults were completed: No Patient Arrived: Ambulatory Added or deleted any medications: No Arrival Time: 14:03 Any new allergies or adverse reactions: No Accompanied By: self Had a fall or experienced change in No Transfer Assistance: None activities of daily living that may affect Patient Identification Verified: Yes risk of falls: Secondary Verification Process Yes Signs or symptoms of abuse/neglect since last visito No Completed: Hospitalized since last visit: No Patient Requires Transmission-Based No Pain Present Now: No Precautions: Patient Has Alerts: Yes Patient Alerts: Patient on Blood Thinner NOT DIABETIC Electronic Signature(s) Signed: 11/25/2017 4:14:11 PM By: [name] Entered By: [name] on 11/25/2017 14:12:16 [name], [name] ([id number]) -------------------------------------------------------------------------------- Compression Therapy Details Patient Name: [name], [name] Date of Service: 11/25/2017 1:45 PM Medical Record Number: [id number] Patient Account Number: [id number] Date of Birth/Sex: 1956/12/14 (61 y.o. Female) Treating [name]: [name] Primary Care Iylah Dworkin: [name] Other [name]: Referring Paola Flynt: [name] Treating [name] Weeks in Treatment: [name] Compression Therapy Performed for Wound Assessment: Wound #3 Left,Medial,Anterior Lower Leg Performed By: [name] Compression Type: Three Layer Pre Treatment ABI: 1.2 Post Procedure Diagnosis Same as Pre-procedure Electronic Signature(s) Signed: 11/25/2017 4:01:53 PM By: [name] Entered By: [name] on 11/25/2017 16:01:53 [name], [name] ([id number]) -------------------------------------------------------------------------------- Encounter Discharge Information Details Patient Name: [name], [name] Date of Service: 11/25/2017 1:45 PM Medical Record Number: [id number] Patient Account Number: [id number] Date of Birth/Sex: 1956-04-27 (61 y.o. Female) Treating [name]: [name] Primary Care Kae Lauman: [name] Other [name]: Referring Birch Farino: [name] Treating Cheetara Hoge/Extender: [name], [name] Encounter Discharge Information Items Discharge Pain Level: 0 Discharge Condition: Stable Ambulatory Status: Ambulatory Discharge Destination: Home Private Transportation: Auto Accompanied By: self Schedule Follow-up Appointment: Yes Medication Reconciliation completed and provided No to Patient/Care Nesta Scaturro: Clinical Summary of Care: Electronic Signature(s) Signed: 11/25/2017 4:14:11 PM By: [name] Entered By: [name] on 11/25/2017 15:00:46 [name], [name] ([id number]) -------------------------------------------------------------------------------- Lower Extremity Assessment Details Patient Name: [name], [name] Date of Service: 11/25/2017 1:45 PM Medical Record Number: [id number] Patient Account Number: [id number] Date of Birth/Sex: 1956-01-02 (61 y.o. Female) Treating [name]: [name] Primary Care Zachory Mangual: [name] Other [name]: Referring Danaysha Kirn: [name] Treating Judah Chevere/Extender: STONE III, [name] Edema Assessment Assessed: [Left: No] [Right: No] Edema: [Left: N] [Right: o] Calf Left: Right: Point of Measurement: 30 cm From Medial Instep  50.5 cm cm Ankle Left: Right: Point of Measurement: 10 cm From Medial Instep 20.[name] cm cm Vascular Assessment Claudication: Claudication Assessment [Left:None] Pulses: Dorsalis Pedis Palpable: [Left:Yes] Posterior Tibial Extremity colors, hair growth, and conditions: Extremity Color: [Left:Hyperpigmented] Hair Growth on Extremity: [Left:No] Temperature of Extremity: [Left:Cool] Capillary Refill: [Left:< 3 seconds] Toe Nail Assessment Left: Right: Thick: No Discolored: No Deformed: No Improper Length and Hygiene: No Electronic Signature(s) Signed: 11/25/2017 4:14:11 PM By: [name] Entered By: [name] on 11/25/2017 14:37:58 [name], [name] ([id number]) -------------------------------------------------------------------------------- Multi Wound Chart Details Patient Name: [name], [name] Date of Service: 11/25/2017 1:45 PM Medical Record Number: [id number] Patient Account Number: [id number] Date of Birth/Sex: 08-14-1956 (61  y.o. 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] [N/A:N/A] Comorbid History: [3:Middle ear problems, Hypertension, Peripheral Venous Disease, Osteoarthritis] [N/A:N/A] Date Acquired: [3:07/29/2017] [N/A:N/A] Weeks of Treatment: [3:[name]] [N/A:N/A] Wound Status: [3:Open] [N/A:N/A] Measurements L x W x D [3:1x1.7x0.2] [N/A:N/A] (cm) Area (cm) : [3:1.335] [N/A:N/A] Volume (cm) : [3:0.267] [N/A:N/A] % Reduction in Area: [3:66.00%]  [N/A:N/A] % Reduction in Volume: [3:66.00%] [N/A:N/A] Classification: [3:Full Thickness Without Exposed Support Structures] [N/A:N/A] Exudate Amount: [3:Small] [N/A:N/A] Exudate Type: [3:Serous] [N/A:N/A] Exudate Color: [3:amber] [N/A:N/A] Wound Margin: [3:Flat and Intact] [N/A:N/A] Granulation Amount: [3:Medium (34-66%)] [N/A:N/A] Granulation Quality: [3:Red, Pink] [N/A:N/A] Necrotic Amount: [3:Medium (34-66%)] [N/A:N/A] Necrotic Tissue: [3:Eschar, Adherent Slough] [N/A:N/A] Exposed Structures: [3:Fat Layer (Subcutaneous Tissue) Exposed: Yes Fascia: No Tendon: No Muscle: No Joint: No Bone: No] [N/A:N/A] Epithelialization: [3:None] [N/A:N/A] Periwound Skin Texture: [N/A:N/A] Excoriation: Yes Induration: Yes Callus: No Crepitus: No Rash: No Scarring: No Periwound Skin Moisture: Maceration: No N/A N/A Dry/Scaly: No Periwound Skin Color: Atrophie Blanche: No N/A N/A Cyanosis: No Ecchymosis: No Erythema: No Hemosiderin Staining: No Mottled: No Pallor: No Rubor: No Tenderness on Palpation: No N/A N/A Wound Preparation: Ulcer Cleansing: N/A N/A Rinsed/Irrigated with Saline Topical Anesthetic Applied: Other: lidocaine 4% Treatment Notes Electronic Signature(s) Signed: 11/25/2017 4:14:11 PM By: [name] CriglerFlinchum, [name] Entered By: [name] CriglerFlinchum, [name] on 11/25/2017 14:38:45 [name], [name] AMarland Kitchen. ([id number]) -------------------------------------------------------------------------------- Multi-Disciplinary Care Plan Details Patient Name: [name] BeamEIMILLER, [name] Date of Service: 11/25/2017 1:45 PM Medical Record Number: [id number] Patient Account Number: [id number] Date of Birth/Sex: 15-Apr-1956 (61 y.o. Female) Treating [name]: [name] CriglerFlinchum, [name] Primary Care Mame Twombly: [name] FetchAYCOCK, [name] Other [name]: Referring Trine Fread: [name] FetchAYCOCK, [name] Treating Yamina Lenis/Extender: STONE III, [name] Active Inactive ` Orientation to the Wound Care Program Nursing Diagnoses: Knowledge deficit  related to the wound healing center program Goals: Patient/caregiver will verbalize understanding of the Wound Healing Center Program Date Initiated: 10/04/2017 Target Resolution Date: 10/21/2017 Goal Status: Active Interventions: Provide education on orientation to the wound center Notes: ` Venous Leg Ulcer Nursing Diagnoses: Actual venous Insuffiency (use after diagnosis is confirmed) Goals: Patient will maintain optimal edema control Date Initiated: 10/04/2017 Target Resolution Date: 10/21/2017 Goal Status: Active Interventions: Assess peripheral edema status every visit. Treatment Activities: Non-invasive vascular studies : 10/04/2017 Notes: ` Wound/Skin Impairment Nursing Diagnoses: Impaired tissue integrity Goals: Ulcer/skin breakdown will have a volume reduction of 30% by week 4 Date Initiated: 10/04/2017 Target Resolution Date: 11/04/2017 [name], [name] ([id number]) Goal Status: Active Interventions: Assess ulceration(s) every visit Treatment Activities: Skin care regimen initiated : 10/04/2017 Topical wound management initiated : 10/04/2017 Notes: Electronic Signature(s) Signed: 11/25/2017 4:14:11 PM By: [name] CriglerFlinchum, [name] Entered By: [name] CriglerFlinchum, [name] on 11/25/2017 14:38:07 [name], [name] ([id number]) -------------------------------------------------------------------------------- Pain Assessment Details Patient Name: [name], [name] Date of Service: 11/25/2017 1:45 PM Medical Record Number: [id number] Patient Account Number: [id number] Date of Birth/Sex: 15-Apr-1956 (61 y.o. Female) Treating [name]: [name] CriglerFlinchum, [name] Primary Care Pinkey Mcjunkin: [name] FetchAYCOCK, [name] Other [name]: Referring Murel Wigle: [name] FetchAYCOCK, [name] Treating Jaslene Marsteller/Extender: STONE III, [name] Active Problems Location of Pain Severity and Description of Pain Patient Has Paino No Site Locations Pain Management and Medication Current Pain Management: Electronic  Signature(s) Signed: 11/25/2017 4:14:11 PM By:  [name] Entered By: [name] CriglerFlinchum, [name] on 11/25/2017 14:12:25 [name], [name] AMarland Kitchen. ([id number]) -------------------------------------------------------------------------------- Wound Assessment Details Patient Name: [name], [name] Date of Service: 11/25/2017 1:45 PM Medical Record Number: [id number] Patient Account Number: [id number] Date of Birth/Sex: July 27, 1956 (61 y.o. Female) Treating [name]: [name] CriglerFlinchum, [name] Primary Care Arihana Ambrocio: [name] FetchAYCOCK, [name] Other [name]: Referring Mariabella Nilsen: [name] FetchAYCOCK, [name] Treating Khamauri Bauernfeind/Extender: STONE III, [name] Wound Status Wound Number: 3 Primary Venous Leg Ulcer Etiology: Wound Location: Left Lower Leg - Medial, Anterior Wound Open Wounding Event: Gradually Appeared Status: Date Acquired: 07/29/2017 Comorbid Middle ear problems, Hypertension, Peripheral Weeks Of Treatment: [name] History: Venous Disease, Osteoarthritis Clustered Wound: No Photos Photo Uploaded By: Elliot GurneyWoody, BSN, [name], CWS, Kim on 11/25/2017 16:19:55 Wound Measurements Length: (cm) 1 Width: (cm) 1.[name] Depth: (cm) 0.2 Area: (cm) 1.335 Volume: (cm) 0.267 % Reduction in Area: 66% % Reduction in Volume: 66% Epithelialization: None Tunneling: No Undermining: No Wound Description Full Thickness Without Exposed Support Foul Odo Classification: Structures Wound Margin: Flat and Intact Exudate Small Amount: Exudate Type: Serous Exudate Color: amber r After Cleansing: No Wound Bed Granulation Amount: Medium (34-66%) Exposed Structure Granulation Quality: Red, Pink Fascia Exposed: No Necrotic Amount: Medium (34-66%) Fat Layer (Subcutaneous Tissue) Exposed: Yes Necrotic Quality: Eschar, Adherent Slough Tendon Exposed: No Muscle Exposed: No Joint Exposed: No Bone Exposed: No [name], [name] Periwound Skin Texture Texture Color No Abnormalities Noted: No No Abnormalities Noted:  No Callus: No Atrophie Blanche: No Crepitus: No Cyanosis: No Excoriation: Yes Ecchymosis: No Induration: Yes Erythema: No Rash: No Hemosiderin Staining: No Scarring: No Mottled: No Pallor: No Moisture Rubor: No No Abnormalities Noted: No Dry / Scaly: No Maceration: No Wound Preparation Ulcer Cleansing: Rinsed/Irrigated with Saline Topical Anesthetic Applied: Other: lidocaine 4%, Treatment Notes Wound #3 (Left, Medial, Anterior Lower Leg) 1. Cleansed with: Clean wound with Normal Saline 2. Anesthetic Topical Lidocaine 4% cream to wound bed prior to debridement 4. Dressing Applied: Prisma Ag 5. Secondary Dressing Applied ABD Pad [name]. Secured with 3 Layer Compression System - Left Lower Extremity Electronic Signature(s) Signed: 11/25/2017 4:14:11 PM By: [name] CriglerFlinchum, [name] Entered By: [name] CriglerFlinchum, [name] on 11/25/2017 14:25:32 Mcmanigal, [name] ([id number]) -------------------------------------------------------------------------------- Vitals Details Patient Name: [name] BeamEIMILLER, [name] Date of Service: 11/25/2017 1:45 PM Medical Record Number: [id number] Patient Account Number: [id number] Date of Birth/Sex: July 27, 1956 (61 y.o. Female) Treating [name]: [name] CriglerFlinchum, [name] Primary Care Jamonica Schoff: [name] FetchAYCOCK, [name] Other [name]: Referring Ezri Fanguy: [name] FetchAYCOCK, [name] Treating Hadasa Gasner/Extender: STONE III, [name] Vital Signs Time Taken: 02:13 Temperature (F): 98.3 Height (in): 60 Pulse (bpm): 72 Weight (lbs): 230 Respiratory Rate (breaths/min): 16 Body Mass Index (BMI): 44.9 Blood Pressure (mmHg): 138/54 Reference Range: 80 - 120 mg / dl Electronic Signature(s) Signed: 11/25/2017 4:14:11 PM By: [name] CriglerFlinchum, [name] Entered By: [name] CriglerFlinchum, [name] on 11/25/2017 14:13:27

## 2017-11-26 NOTE — Progress Notes (Addendum)
[name], [name] ([id number]) Visit Report for 11/25/2017 Chief Complaint Document Details Patient Name: [name], [name] Date of Service: 11/25/2017 1:45 PM Medical Record Number: [id number] Patient Account Number: [id number] Date of Birth/Sex: 19-Feb-1956 (61 y.o. Female) Treating RN: [name] Primary Care Provider: Karie [name] Other Clinician: Referring Provider: Karie [name] Treating Provider/Extender: [name], Asaph Serena Weeks in Treatment: 7 Information Obtained from: Patient Chief Complaint Patient presents for treatment of an open ulcer due to venous insufficiency which she's had for several months. she is a return patient who we had seen several years ago and has not been able to get appropriate treatment due to the lack of insurance. Electronic Signature(s) Signed: 11/25/2017 5:27:57 PM By: Lenda Kelp PA-C Entered By: Lenda Kelp on 11/25/2017 14:37:03 [name], [name] ([id number]) -------------------------------------------------------------------------------- HPI Details Patient Name: [name], [name] Date of Service: 11/25/2017 1:45 PM Medical Record Number: [id number] Patient Account Number: [id number] Date of Birth/Sex: 02-10-1956 (61 y.o. Female) Treating RN: [name] Primary Care Provider: Karie [name] Other Clinician: Referring Provider: Karie [name] Treating Provider/Extender: [name] Weeks in Treatment: 7 History of Present Illness Location: swelling of the left lower extremity with ulceration Quality: Patient reports experiencing a dull pain to affected area(s). Severity: Patient states wound are getting worse. Duration: Patient has had the wound for > 3 months prior to seeking treatment at the wound center Timing: Pain in wound is Intermittent (comes and goes Context: The wound would happen gradually Modifying Factors: Other treatment(s) tried include:Neosporin ointment locally Associated Signs and Symptoms: Patient  reports having increase discharge. HPI Description: 61 year old patient who is known to our practice from a previous visit here in 2015 and 2017 for a similar problem. At that stage, she was lost to follow-up, because she did not have insurance and did not complete a venous ablation. I understand she has now obtained insurance and has an appointment to see Dr. Hart Rochester at [facility] vascular in [place], on November 5th. Other than that the patient has had no other issues 10/18/17 on evaluation today patient appears to be doing okay in regard to her left anterior lower extremity wound. She tells me she does have some discomfort but the wraps that we are performing which is a three layer wrap seems to be of benefit for her. She also has an appointment upcoming with Dr. Hart Rochester from November 5 to discuss options for preventing these type of venous ulcers from occurring in the future. No fevers chills noted. 11/01/2017 -- she was seen by Dr. Hart Rochester on 10/28/2017, and noted that there was an unsuccessful left great saphenous vein ablation done 3 years ago in [facility]. on doing a venous duplex exam of the left leg patient had a large caliber left great saphenous vein which had rapid cross reflux throughout identified from the proximal calf to the saphenofemoral junction. There was also a large caliber left small saphenous vein. he recommended laser ablation of the left great saphenous vein from the proximal calf to near the saphenofemoral junction as soon as possible. she is willing to proceed 11/08/2017 -- I understand her endovenous ablation has been planned for November 20 and she will see [place] sometime after this 11/25/17 on evaluation today patient appears to be doing well. She states that she has been very pleased with her endovenous ablation which was performed November 20. She has noted some improvement in the swelling of her leg already. With that being said her wound appears to be  doing well  on evaluation today as well. She has no pain and no slough covering the wound bed. She does inquire about compression for once her wound heals. I discussed that a farrow wrap basic may be beneficial for her considering the wine bottle appearance of her lower extremities. However I would like to give her leg a little bit more time for the swelling to improve before measurement. ======= Old notes This patient was been previously seen at the wound center is being seen by me for the first time today. 33F with h/o lower extremity swelling presents with a left lower extremity ulcer (first presented to [place] on 07/12/14) . the patient is deaf and lip reads and is fairly conversant. She has had venous ablation in 2015 but due to lack of insurance and money she has been unable to follow-up with the vascular surgeons not she been wearing compression garments. Does not have a past medical history of diabetes or other issues. He has not been wearing compression stockings and we did try to get her Velcro wraps but she is unable to afford the copayment. Limon, [name] ([id number]) Addendum: I have found some reports from August 2015 when an arterial study was done and the left lower extremity had triphasic waveform showing no arterial insufficiency. She was also to found to have left greater saphenous vein reflux throughout with tortuous veins and on 08/27/2014 she had a laser ablation done. At a follow-up on September 8 it was found that she needed a second procedure as a first procedure was not completely successful. Due to the patient's insurance issues she has not gone back for the second procedure. 03/02/2016 -- he is here with the interpreter today and we have had a thorough discussion about all the above and all questions answered. ========== Electronic Signature(s) Signed: 11/25/2017 5:27:57 PM By: Lenda Kelp PA-C Entered By: Lenda Kelp on 11/25/2017 14:55:02 [name],  [name] -------------------------------------------------------------------------------- Physical Exam Details Patient Name: [name], [name] Date of Service: 11/25/2017 1:45 PM Medical Record Number: [id number] Patient Account Number: [id number] Date of Birth/Sex: [birth year] (61 y.o. Female) Treating RN: [name] Primary Care Provider: Karie [name] Other Clinician: Referring Provider: Karie [name] Treating Provider/Extender: [name] III, Harwood Nall Weeks in Treatment: 7 Constitutional Obese and well-hydrated in no acute distress. Respiratory normal breathing without difficulty. clear to auscultation bilaterally. Cardiovascular regular rate and rhythm with normal S1, S2. 1+ pitting edema of the bilateral lower extremities. Psychiatric this patient is able to make decisions and demonstrates good insight into disease process. Alert and Oriented x 3. pleasant and cooperative. Notes Patient's wound appears to be granular nature and is doing very well. There is no slough covering and no need for debridement. Electronic Signature(s) Signed: 11/25/2017 5:27:57 PM By: Lenda Kelp PA-C Entered By: Lenda Kelp on 11/25/2017 14:56:22 [name], [name] ([id number]) -------------------------------------------------------------------------------- Physician Orders Details Patient Name: Tardif, [name] Date of Service: 11/25/2017 1:45 PM Medical Record Number: [id number] Patient Account Number: [id number] Date of Birth/Sex: [birth year] (61 y.o. 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 Anesthetic Wound #3 Left,Medial,Anterior Lower Leg o Topical Lidocaine 4% cream applied to wound bed prior to debridement Primary Wound Dressing Wound #3 Left,Medial,Anterior Lower Leg o Prisma Ag Secondary Dressing Wound #3 Left,Medial,Anterior Lower Leg o ABD pad Dressing Change Frequency Wound #3 Left,Medial,Anterior Lower Leg o Change dressing every week Follow-up Appointments Wound #3 Left,Medial,Anterior Lower Leg o Return Appointment in 1 week. o Nurse Visit as needed Edema Control Wound #3 Left,Medial,Anterior Lower Leg o 3 Layer Compression System - Left Lower Extremity Electronic Signature(s) Signed: 11/25/2017 4:14:11 PM By: [name] Signed: 11/25/2017 5:27:57 PM By: Lenda Kelp PA-C Entered By: [name] on 11/25/2017 14:58:30 [name], [name] ([id number]) [name], [name] ([id number]) -------------------------------------------------------------------------------- Problem List Details Patient Name: [name], [name] Date of Service: 11/25/2017 1:45 PM Medical Record Number: [id number] Patient Account Number: [id number] Date of Birth/Sex: 07/08/1956 (61 y.o. 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 ([id number]) -------------------------------------------------------------------------------- Progress Note Details Patient Name: [name], [name] Date of Service: 11/25/2017 1:45 PM Medical Record Number: [id number] Patient Account Number: [id number] Date of Birth/Sex: 07-Jun-1956 (61 y.o. Female) Treating RN: [name] Primary Care Provider: Karie [name] Other Clinician: Referring Provider: Karie [name] Treating Provider/Extender: [name], Tanairy Payeur Weeks in Treatment: 7 Subjective Chief Complaint Information obtained from Patient Patient presents for treatment of an open ulcer due to venous insufficiency which she's had for several months. she is a return patient who we had seen several years ago and has not been able to get appropriate treatment due to the lack of insurance. History of Present Illness (HPI) The following HPI elements were documented for the patient's wound: Location: swelling of the left lower extremity with ulceration Quality: Patient reports experiencing a dull pain to affected area(s). Severity: Patient states wound are getting worse. Duration: Patient has had the wound for > 3 months prior to seeking treatment at the wound center Timing: Pain in wound is Intermittent (comes and goes Context: The wound would happen gradually Modifying Factors: Other treatment(s) tried include:Neosporin ointment locally Associated Signs and Symptoms: Patient reports having increase discharge. 61 year old patient who is known to our practice from a previous visit here in 2015 and 2017 for a similar problem. At that stage, she was lost to follow-up, because she did not have insurance and did not complete a venous ablation. I understand she has now obtained insurance and has an appointment to see Dr. Hart Rochester at [facility] and vascular in  [place], on November 5th. Other than that the patient has had no other issues 10/18/17 on evaluation today patient appears to be doing okay in regard to her left anterior lower extremity wound. She tells me she does have some discomfort but the wraps that we are performing which is a three layer wrap seems to be of benefit for her. She also has an appointment upcoming with Dr. Hart Rochester from November 5 to discuss options for preventing these type of venous ulcers from occurring  in the future. No fevers chills noted. 11/01/2017 -- she was seen by Dr. Hart Rochester on 10/28/2017, and noted that there was an unsuccessful left great saphenous vein ablation done 3 years ago in [facility]. on doing a venous duplex exam of the left leg patient had a large caliber left great saphenous vein which had rapid cross reflux throughout identified from the proximal calf to the saphenofemoral junction. There was also a large caliber left small saphenous vein. he recommended laser ablation of the left great saphenous vein from the proximal calf to near the saphenofemoral junction as soon as possible. she is willing to proceed 11/08/2017 -- I understand her endovenous ablation has been planned for November 20 and she will see [place] sometime after this 11/25/17 on evaluation today patient appears to be doing well. She states that she has been very pleased with her endovenous ablation which was performed November 20. She has noted some improvement in the swelling of her leg already. With that being said her wound appears to be doing well on evaluation today as well. She has no pain and no slough covering the wound bed. She does inquire about compression for once her wound heals. I discussed that a farrow wrap basic may be beneficial for her considering the wine bottle appearance of her lower extremities. However I would like to give her leg a little bit more time for the swelling to improve before  measurement. ======= Old notes [name], [name] ([id number]) This patient was been previously seen at the wound center is being seen by me for the first time today. 21F with h/o lower extremity swelling presents with a left lower extremity ulcer (first presented to [place] on 07/12/14) . the patient is deaf and lip reads and is fairly conversant. She has had venous ablation in 2015 but due to lack of insurance and money she has been unable to follow-up with the vascular surgeons not she been wearing compression garments. Does not have a past medical history of diabetes or other issues. He has not been wearing compression stockings and we did try to get her Velcro wraps but she is unable to afford the copayment. Addendum: I have found some reports from August 2015 when an arterial study was done and the left lower extremity had triphasic waveform showing no arterial insufficiency. She was also to found to have left greater saphenous vein reflux throughout with tortuous veins and on 08/27/2014 she had a laser ablation done. At a follow-up on September 8 it was found that she needed a second procedure as a first procedure was not completely successful. Due to the patient's insurance issues she has not gone back for the second procedure. 03/02/2016 -- he is here with the interpreter today and we have had a thorough discussion about all the above and all questions answered. ========== Patient History Information obtained from Patient. Family History Cancer, Diabetes - Mother, Heart Disease - Mother,Father, Hypertension - Mother, Kidney Disease - Mother, Lung Disease - Mother, Stroke - Mother, Thyroid Problems - Mother, No family history of Seizures, Tuberculosis. Social History Former smoker - quit 2005, Marital Status - Divorced, Alcohol Use - Never, Drug Use - No History, Caffeine Use - Daily. Medical History Hospitalization/Surgery History - 06/23/2004, [facility] Removal. Medical And  Surgical History Notes Ear/Nose/Mouth/Throat 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) Review of Systems (ROS) Constitutional Symptoms (General Health) Denies complaints or symptoms of Fever, Chills. Respiratory The patient has no complaints  or symptoms. Cardiovascular Complains or has symptoms of LE edema - Bilateral. Psychiatric The patient has no complaints or symptoms. Objective Constitutional [name], [name] ([id number]) Obese and well-hydrated in no acute distress. Vitals Time Taken: 2:13 AM, Height: 60 in, Weight: 230 lbs, BMI: 44.9, Temperature: 98.3 F, Pulse: 72 bpm, Respiratory Rate: 16 breaths/min, Blood Pressure: 138/54 mmHg. Respiratory normal breathing without difficulty. clear to auscultation bilaterally. Cardiovascular regular rate and rhythm with normal S1, S2. 1+ pitting edema of the bilateral lower extremities. Psychiatric this patient is able to make decisions and demonstrates good insight into disease process. Alert and Oriented x 3. pleasant and cooperative. General Notes: Patient's wound appears to be granular nature and is doing very well. There is no slough covering and no need for debridement. Integumentary (Hair, Skin) Wound #3 status is Open. Original cause of wound was Gradually Appeared. The wound is located on the Left,Medial,Anterior Lower Leg. The wound measures 1cm length x 1.7cm width x 0.2cm depth; 1.335cm^2 area and 0.267cm^3 volume. There is Fat Layer (Subcutaneous Tissue) Exposed exposed. There is no tunneling or undermining noted. There is a small amount of serous drainage noted. The wound margin is flat and intact. There is medium (34-66%) red, pink granulation within the wound bed. There is a medium (34-66%) amount of necrotic tissue within the wound bed including Eschar and Adherent Slough. The periwound skin appearance exhibited: Excoriation, Induration. The periwound skin appearance did not exhibit: Callus,  Crepitus, Rash, Scarring, Dry/Scaly, Maceration, Atrophie Blanche, Cyanosis, Ecchymosis, Hemosiderin Staining, Mottled, Pallor, Rubor, Erythema. 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 There was a Three Layer Compression Therapy Procedure with a pre-treatment ABI of 1.2 by [name] CriglerFlinchum, Cheryl, RN. Post procedure Diagnosis Wound #3: Same as Pre-Procedure [name], [name] ([id number]) Plan Wound Cleansing: Wound #3 Left,Medial,Anterior Lower Leg: May Shower, gently pat wound dry prior to applying new dressing. Anesthetic: Wound #3 Left,Medial,Anterior Lower Leg: Topical Lidocaine 4% cream applied to wound bed prior to debridement Primary Wound Dressing: Wound #3 Left,Medial,Anterior Lower Leg: Prisma Ag Secondary Dressing: Wound #3 Left,Medial,Anterior Lower Leg: ABD pad Dressing Change Frequency: Wound #3 Left,Medial,Anterior Lower Leg: Change dressing every week Follow-up Appointments: Wound #3 Left,Medial,Anterior Lower Leg: Return Appointment in 1 week. Nurse Visit as needed Edema Control: Wound #3 Left,Medial,Anterior Lower Leg: 3 Layer Compression System - Left Lower Extremity I'm going to recommend that we continue with the Current wound care measures for the next week. Hopefully her wound will continue to improve. I do think that we need to consider ordering her as the [place] wrap basic in the near future so that she will have this went her wound clears and completely healed to prevent future recurrence. Patient is in agreement with this plan. Please see above for specific wound care orders. We will see patient for re-evaluation in 1  week here in the clinic. If anything worsens or changes patient will contact our office for additional recommendations. Electronic Signature(s) Signed: 12/13/2017 10:51:49 AM By: Elliot GurneyWoody, BSN, RN, CWS, Kim RN, BSN Signed: 12/15/2017 1:59:56 AM By: Lenda KelpStone III, Mikah Poss PA-C Previous Signature: 11/25/2017 5:27:57 PM Version By: Lenda KelpStone III, Shaquera Ansley PA-C Entered By: Elliot GurneyWoody, BSN, RN, CWS, Kim on 12/13/2017 10:51:49 Huntsman, [name]  A. ([id number]) -------------------------------------------------------------------------------- ROS/PFSH Details Patient Name: [name], [name] Date of Service: 11/25/2017 1:45 PM Medical Record Number: [id number] Patient Account Number: [id number] Date of Birth/Sex: 1956-04-08 (61 y.o. Female) Treating RN: [name] CriglerFlinchum, Cheryl Primary Care Provider: Karie FetchAYCOCK, NGWE Other Clinician: Referring Provider: Karie FetchAYCOCK, NGWE Treating Provider/Extender: [name] III, Max Nuno Weeks in Treatment: 7 Information Obtained From Patient Wound History Do you currently have one or more open woundso Yes How many open wounds do you currently haveo 1 Approximately how long have you had your woundso 2 months How have you been treating your wound(s) until nowo neosporin Has your wound(s) ever healed and then re-openedo Yes Have you had any lab work done in the past montho No Have you tested positive for an antibiotic resistant organism (MRSA, VRE)o No Have you tested positive for osteomyelitis (bone infection)o No Have you had any tests for circulation on your legso Yes Have you had other problems associated with your woundso Infection, Swelling Constitutional Symptoms (General Health) Complaints and Symptoms: Negative for: Fever; Chills Cardiovascular Complaints and Symptoms: Positive for: LE edema - Bilateral Medical History: Positive for: Hypertension; Peripheral Venous Disease Negative for: Angina; Arrhythmia; Congestive Heart Failure; Coronary Artery Disease; Deep Vein Thrombosis;  Hypotension; Myocardial Infarction; Peripheral Arterial Disease; Vasculitis Eyes Medical History: Negative for: Cataracts; Glaucoma; Optic Neuritis Ear/Nose/Mouth/Throat Medical History: Positive for: Middle ear problems Negative for: Chronic sinus problems/congestion Past Medical History Notes: 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 ([id number]) Complaints and Symptoms: No Complaints or Symptoms Medical History: Negative for: Aspiration; Asthma; Chronic Obstructive Pulmonary Disease (COPD); Pneumothorax; Sleep Apnea; Tuberculosis Gastrointestinal Medical History: Negative for: Cirrhosis ; Colitis; Crohnos; Hepatitis A; Hepatitis B; Hepatitis C Endocrine Medical History: Negative for: Type I Diabetes; Type II Diabetes Genitourinary Medical History: Negative for: End Stage Renal Disease Immunological Medical History: Negative for: Lupus Erythematosus; Raynaudos; Scleroderma Integumentary (Skin) Medical History: Negative for: History of Burn; History of pressure wounds Musculoskeletal Medical History: Positive for: Osteoarthritis Negative for: Gout; Rheumatoid Arthritis; Osteomyelitis Neurologic Medical History: Negative for: Dementia; Neuropathy; Quadriplegia; Paraplegia; Seizure Disorder Oncologic Medical History: Negative for: Received Chemotherapy; Received Radiation Psychiatric Complaints and Symptoms: No Complaints or Symptoms Medical History: Negative for: Anorexia/bulimia; Confinement Anxiety HBO Extended History Items Ear/Nose/Mouth/Throat: Middle ear problems [place]ano, [name] ([id number]) Immunizations Pneumococcal Vaccine: Received Pneumococcal Vaccination: No Implantable Devices Hospitalization / Surgery History Name of Hospital Purpose of  Hospitalization/Surgery Date [facility] Removal 06/23/2004 Family and Social History Cancer: Yes; Diabetes: Yes - Mother; Heart Disease: Yes - Mother,Father; Hypertension: Yes - Mother; Kidney Disease: Yes - Mother; Lung Disease: Yes - Mother; Seizures: No; Stroke: Yes - Mother; Thyroid Problems: Yes - Mother; Tuberculosis: No; Former smoker - quit 2005; Marital Status - Divorced; Alcohol Use: Never; Drug Use: No History; Caffeine Use: Daily; Financial Concerns: Yes; Food, Clothing or Shelter Needs: No; Support System Lacking: No; Transportation Concerns: No; Advanced Directives: No; Patient does not want information on Advanced Directives; Do not resuscitate: No; Living Will: No; Medical Power of Attorney: No Physician Affirmation I have reviewed and agree with the above information. Electronic Signature(s) Signed: 11/25/2017 4:14:11 PM By: [name] CriglerFlinchum, Cheryl Signed: 11/25/2017 5:27:57 PM By: Lenda KelpStone III, Toriana Sponsel PA-C Entered By: Lenda KelpStone III, Ryann Leavitt on 11/25/2017 14:55:25 Polasek, [name] ([id number]) -------------------------------------------------------------------------------- SuperBill Details Patient Name: [name], [name] Date of Service: 11/25/2017 Medical Record Number: [id number] Patient Account Number: [id number] Date of Birth/Sex: 1956-04-08 (61 y.o.  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 E66.01 Morbid (severe) obesity due to excess calories Facility Procedures CPT4 Code: [id number] Description: (Facility Use Only) [id number] - APPLY MULTLAY COMPRS LWR LT LEG Modifier: Quantity: 1 Physician Procedures CPT4 Code Description:  [id number] - WC PHYS LEVEL 3 - EST PT ICD-10 Diagnosis Description L97.222 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

## 2017-12-02 ENCOUNTER — Ambulatory Visit: Payer: BLUE CROSS/BLUE SHIELD | Admitting: Surgery

## 2017-12-04 ENCOUNTER — Encounter: Payer: BLUE CROSS/BLUE SHIELD | Admitting: Internal Medicine

## 2017-12-04 DIAGNOSIS — I83222 Varicose veins of left lower extremity with both ulcer of calf and inflammation: Secondary | ICD-10-CM | POA: Diagnosis not present

## 2017-12-07 NOTE — Progress Notes (Addendum)
[name], [name] ([id number]) Visit Report for 12/04/2017 Arrival Information Details Patient Name: [name], [name] Date of Service: 12/04/2017 2:30 PM Medical Record Number: [id number] Patient Account Number: [id number] Date of Birth/Sex: 07-Mar-1956 (61 y.o. Female) Treating RN: Huel CoventryWoody, Kim Primary Care Kumari Sculley: Karie FetchAYCOCK, NGWE Other Clinician: Referring Derico Mitton: Karie FetchAYCOCK, NGWE Treating Breton Berns/Extender: Altamese CarolinaOBSON, MICHAEL G Weeks in Treatment: 8 Visit Information History Since Last Visit Added or deleted any medications: No Patient Arrived: Ambulatory Any new allergies or adverse reactions: No Arrival Time: 14:12 Had a fall or experienced change in No Accompanied By: self activities of daily living that may affect Transfer Assistance: None risk of falls: Patient Identification Verified: Yes Signs or symptoms of abuse/neglect since last visito No Secondary Verification Process Yes Hospitalized since last visit: No Completed: Has Dressing in Place as Prescribed: Yes Patient Requires Transmission-Based No Has Compression in Place as Prescribed: Yes Precautions: Pain Present Now: No Patient Has Alerts: Yes Patient Alerts: Patient on Blood Thinner NOT DIABETIC Electronic Signature(s) Signed: 12/04/2017 4:30:20 PM By: Elliot GurneyWoody, BSN, RN, CWS, Kim RN, BSN Entered By: Elliot GurneyWoody, BSN, RN, CWS, Kim on 12/04/2017 14:12:39 [name], [name] ([id number]) -------------------------------------------------------------------------------- Clinic Level of Care Assessment Details Patient Name: Mclester, [name] Date of Service: 12/04/2017 2:30 PM Medical Record Number: [id number] Patient Account Number: [id number] Date of Birth/Sex: 07-Mar-1956 (61 y.o. Female) Treating RN: Huel CoventryWoody, Kim Primary Care Saranda Legrande: Karie FetchAYCOCK, NGWE Other Clinician: Referring Cherree Conerly: Karie FetchAYCOCK, NGWE Treating Bryne Lindon/Extender: Altamese CarolinaOBSON, MICHAEL G Weeks in Treatment: 8 Clinic Level of Care Assessment Items TOOL 4  Quantity Score []  - Use when only an EandM is performed on FOLLOW-UP visit 0 ASSESSMENTS - Nursing Assessment / Reassessment []  - Reassessment of Co-morbidities (includes updates in patient status) 0 X- 1 5 Reassessment of Adherence to Treatment Plan ASSESSMENTS - Wound and Skin Assessment / Reassessment X - Simple Wound Assessment / Reassessment - one wound 1 5 []  - 0 Complex Wound Assessment / Reassessment - multiple wounds []  - 0 Dermatologic / Skin Assessment (not related to wound area) ASSESSMENTS - Focused Assessment []  - Circumferential Edema Measurements - multi extremities 0 []  - 0 Nutritional Assessment / Counseling / Intervention []  - 0 Lower Extremity Assessment (monofilament, tuning fork, pulses) []  - 0 Peripheral Arterial Disease Assessment (using hand held doppler) ASSESSMENTS - Ostomy and/or Continence Assessment and Care []  - Incontinence Assessment and Management 0 []  - 0 Ostomy Care Assessment and Management (repouching, etc.) PROCESS - Coordination of Care X - Simple Patient / Family Education for ongoing care 1 15 []  - 0 Complex (extensive) Patient / Family Education for ongoing care []  - 0 Staff obtains ChiropractorConsents, Records, Test Results / Process Orders []  - 0 Staff telephones HHA, Nursing Homes / Clarify orders / etc []  - 0 Routine Transfer to another Facility (non-emergent condition) []  - 0 Routine Hospital Admission (non-emergent condition) []  - 0 New Admissions / Manufacturing engineernsurance Authorizations / Ordering NPWT, Apligraf, etc. []  - 0 Emergency Hospital Admission (emergent condition) X- 1 10 Simple Discharge Coordination [name], [name] ([id number]) []  - 0 Complex (extensive) Discharge Coordination PROCESS - Special Needs []  - Pediatric / Minor Patient Management 0 []  - 0 Isolation Patient Management []  - 0 Hearing / Language / Visual special needs []  - 0 Assessment of Community assistance (transportation, D/C planning, etc.) []  - 0 Additional  assistance / Altered mentation []  - 0 Support Surface(s) Assessment (bed, cushion, seat, etc.) INTERVENTIONS - Wound Cleansing / Measurement X - Simple Wound Cleansing - one wound 1 5 []  -  0 Complex Wound Cleansing - multiple wounds X- 1 5 Wound Imaging (photographs - any number of wounds) []  - 0 Wound Tracing (instead of photographs) X- 1 5 Simple Wound Measurement - one wound []  - 0 Complex Wound Measurement - multiple wounds INTERVENTIONS - Wound Dressings X - Small Wound Dressing one or multiple wounds 1 10 []  - 0 Medium Wound Dressing one or multiple wounds []  - 0 Large Wound Dressing one or multiple wounds []  - 0 Application of Medications - topical []  - 0 Application of Medications - injection INTERVENTIONS - Miscellaneous []  - External ear exam 0 []  - 0 Specimen Collection (cultures, biopsies, blood, body fluids, etc.) []  - 0 Specimen(s) / Culture(s) sent or taken to Lab for analysis []  - 0 Patient Transfer (multiple staff / Nurse, adult / Similar devices) []  - 0 Simple Staple / Suture removal (25 or less) []  - 0 Complex Staple / Suture removal (26 or more) []  - 0 Hypo / Hyperglycemic Management (close monitor of Blood Glucose) []  - 0 Ankle / Brachial Index (ABI) - do not check if billed separately X- 1 5 Vital Signs [name], [name] Has the patient been seen at the hospital within the last three years: Yes Total Score: 65 Level Of Care: New/Established - Level 2 Electronic Signature(s) Signed: 12/04/2017 4:30:20 PM By: Elliot Gurney, BSN, RN, CWS, Kim RN, BSN Entered By: Elliot Gurney, BSN, RN, CWS, Kim on 12/04/2017 15:01:29 [name], [name] ([id number]) -------------------------------------------------------------------------------- Encounter Discharge Information Details Patient Name: [name], [name] Date of Service: 12/04/2017 2:30 PM Medical Record Number: [id number] Patient Account Number: [id number] Date of Birth/Sex: 1956-09-26 (61 y.o.  Female) Treating RN: Huel Coventry Primary Care Tarence Searcy: Karie Fetch Other Clinician: Referring Taina Landry: Karie Fetch Treating Emmakate Hypes/Extender: Altamese [place] in Treatment: 8 Encounter Discharge Information Items Discharge Pain Level: 0 Discharge Condition: Stable Ambulatory Status: Ambulatory Discharge Destination: Home Transportation: Private Auto Accompanied By: self Schedule Follow-up Appointment: Yes Medication Reconciliation completed and Yes provided to Patient/Care Joury Allcorn: Provided on Clinical Summary of Care: 12/04/2017 Form Type Recipient Paper Patient GR Electronic Signature(s) Signed: 12/04/2017 3:02:52 PM By: Elliot Gurney, BSN, RN, CWS, Kim RN, BSN Entered By: Elliot Gurney, BSN, RN, CWS, Kim on 12/04/2017 15:02:52 Cummiskey, [name] ([id number]) -------------------------------------------------------------------------------- Lower Extremity Assessment Details Patient Name: Fahmy, [name] Date of Service: 12/04/2017 2:30 PM Medical Record Number: [id number] Patient Account Number: [id number] Date of Birth/Sex: June 12, 1956 (61 y.o. Female) Treating RN: Huel Coventry Primary Care Julieanne Hadsall: Karie Fetch Other Clinician: Referring Jaquari Reckner: Karie Fetch Treating Manolito Jurewicz/Extender: Altamese [place] in Treatment: 8 Edema Assessment Assessed: [Left: No] [Right: No] [Left: Edema] [Right: :] Calf Left: Right: Point of Measurement: 30 cm From Medial Instep 50 cm cm Ankle Left: Right: Point of Measurement: 10 cm From Medial Instep 20 cm cm Vascular Assessment Pulses: Dorsalis Pedis Palpable: [Left:Yes] Posterior Tibial Extremity colors, hair growth, and conditions: Extremity Color: [Left:Hyperpigmented] Hair Growth on Extremity: [Left:No] Temperature of Extremity: [Left:Warm] Capillary Refill: [Left:< 3 seconds] Toe Nail Assessment Left: Right: Thick: No Discolored: No Deformed: No Improper Length and Hygiene: No Electronic Signature(s) Signed:  12/04/2017 4:30:20 PM By: Elliot Gurney, BSN, RN, CWS, Kim RN, BSN Entered By: Elliot Gurney, BSN, RN, CWS, Kim on 12/04/2017 14:26:45 [name], [name] ([id number]) -------------------------------------------------------------------------------- Multi Wound Chart Details Patient Name: [name], [name] Date of Service: 12/04/2017 2:30 PM Medical Record Number: [id number] Patient Account Number: [id number] Date of Birth/Sex: 1956-03-29 (61 y.o. Female) Treating RN: Huel Coventry Primary Care Zephaniah Enyeart: Karie Fetch Other Clinician: Referring Wassim Kirksey: Letta Pate  NGWE Treating Liliahna Cudd/Extender: Maxwell CaulOBSON, MICHAEL G Weeks in Treatment: 8 Vital Signs Height(in): 60 Pulse(bpm): 69 Weight(lbs): 230 Blood Pressure(mmHg): 178/68 Body Mass Index(BMI): 45 Temperature(F): 98.0 Respiratory Rate 16 (breaths/min): Photos: [N/A:N/A] Wound Location: Left, Medial, Anterior Lower N/A N/A Leg Wounding Event: Gradually Appeared N/A N/A Primary Etiology: Venous Leg Ulcer N/A N/A Comorbid History: Middle ear problems, N/A N/A Hypertension, Peripheral Venous Disease, Osteoarthritis Date Acquired: 07/29/2017 N/A N/A Weeks of Treatment: 8 N/A N/A Wound Status: Open N/A N/A Measurements L x W x D 0.1x0.1x0.1 N/A N/A (cm) Area (cm) : 0.008 N/A N/A Volume (cm) : 0.001 N/A N/A % Reduction in Area: 99.80% N/A N/A % Reduction in Volume: 99.90% N/A N/A Classification: Partial Thickness N/A N/A Exudate Amount: None Present N/A N/A Wound Margin: Flat and Intact N/A N/A Granulation Amount: None Present (0%) N/A N/A Necrotic Amount: None Present (0%) N/A N/A Exposed Structures: Fascia: No N/A N/A Fat Layer (Subcutaneous Tissue) Exposed: No Tendon: No Muscle: No Joint: No Bone: No Limited to Skin Breakdown Epithelialization: Large (67-100%) N/A N/A Norrod, [name] ([id number]) Periwound Skin Texture: Excoriation: No N/A N/A Induration: No Callus: No Crepitus: No Rash: No Scarring: No Periwound Skin  Moisture: Dry/Scaly: Yes N/A N/A Maceration: No Periwound Skin Color: Atrophie Blanche: No N/A N/A Cyanosis: No Ecchymosis: No Erythema: No Hemosiderin Staining: No Mottled: No Pallor: No Rubor: No Temperature: No Abnormality N/A N/A Tenderness on Palpation: No N/A N/A Wound Preparation: Ulcer Cleansing: N/A N/A Rinsed/Irrigated with Saline Topical Anesthetic Applied: Other: lidocaine 4% Treatment Notes Wound #3 (Left, Medial, Anterior Lower Leg) 1. Cleansed with: Cleanse wound with antibacterial soap and water 3. Peri-wound Care: Moisturizing lotion 4. Dressing Applied: Other dressing (specify in notes) 7. Secured with Other (specify in notes) Notes mepitel lite to protect new skin; Farrow Wrap applied to [employer]. Electronic Signature(s) Signed: 12/04/2017 5:01:55 PM By: Baltazar Najjarobson, Michael MD Entered By: Baltazar Najjarobson, Michael on 12/04/2017 15:07:50 [name], [name] ([id number]) -------------------------------------------------------------------------------- Multi-Disciplinary Care Plan Details Patient Name: Milling, [name] Date of Service: 12/04/2017 2:30 PM Medical Record Number: [id number] Patient Account Number: [id number] Date of Birth/Sex: [birth year] (61 y.o. Female) Treating RN: Huel CoventryWoody, Kim Primary Care Keath Matera: Karie FetchAYCOCK, NGWE Other Clinician: Referring Nycholas Rayner: Karie FetchAYCOCK, NGWE Treating Khloi Rawl/Extender: Altamese CarolinaOBSON, MICHAEL G Weeks in Treatment: 8 Active Inactive Electronic Signature(s) Signed: 01/01/2018 11:26:54 AM By: Elliot GurneyWoody, BSN, RN, CWS, Kim RN, BSN Previous Signature: 12/04/2017 4:30:20 PM Version By: Elliot GurneyWoody, BSN, RN, CWS, Kim RN, BSN Entered By: Elliot GurneyWoody, BSN, RN, CWS, Kim on 01/01/2018 11:26:53 [name], [name] ([id number]) -------------------------------------------------------------------------------- Pain Assessment Details Patient Name: Vollmer, [name] Date of Service: 12/04/2017 2:30 PM Medical Record Number: [id number] Patient Account Number:  [id number] Date of Birth/Sex: [birth year] (61 y.o. Female) Treating RN: Huel CoventryWoody, Kim Primary Care Andee Chivers: Karie FetchAYCOCK, NGWE Other Clinician: Referring Meril Dray: Karie FetchAYCOCK, NGWE Treating Zaul Hubers/Extender: Altamese CarolinaOBSON, MICHAEL G Weeks in Treatment: 8 Active Problems Location of Pain Severity and Description of Pain Patient Has Paino No Site Locations With Dressing Change: No Pain Management and Medication Current Pain Management: Goals for Pain Management Topical or injectable lidocaine is offered to patient for acute pain when surgical debridement is performed. If needed, Patient is instructed to use over the counter pain medication for the following 24-48 hours after debridement. Wound care MDs do not prescribed pain medications. Patient has chronic pain or uncontrolled pain. Patient has been instructed to make an appointment with their Primary Care Physician for pain management. Electronic Signature(s) Signed: 12/04/2017 4:30:20 PM By: Elliot GurneyWoody, BSN, RN, CWS, Kim RN, BSN Entered By: Elliot GurneyWoody,  BSN, RN, CWS, Kim on 12/04/2017 14:12:49 [name], [name] ([id number]) -------------------------------------------------------------------------------- Patient/Caregiver Education Details Patient Name: Zane, [name] Date of Service: 12/04/2017 2:30 PM Medical Record Number: [id number] Patient Account Number: [id number] Date of Birth/Gender: [birth year] (61 y.o. Female) Treating RN: Huel Coventry Primary Care Physician: Karie Fetch Other Clinician: Referring Physician: Karie Fetch Treating Physician/Extender: Altamese [place] in Treatment: 8 Education Assessment Education Provided To: Patient Education Topics Provided Venous: Handouts: Controlling Swelling with Multilayered Compression Wraps, Other: Instruction on application of [employer]. Methods: Demonstration, Explain/Verbal, Printed Responses: State content correctly Electronic Signature(s) Signed: 12/04/2017 4:30:20 PM By: Elliot Gurney,  BSN, RN, CWS, Kim RN, BSN Entered By: Elliot Gurney, BSN, RN, CWS, Kim on 12/04/2017 15:03:41 [name], [name] ([id number]) -------------------------------------------------------------------------------- Wound Assessment Details Patient Name: [name], [name] Date of Service: 12/04/2017 2:30 PM Medical Record Number: [id number] Patient Account Number: [id number] Date of Birth/Sex: Feb 08, 1956 (61 y.o. Female) Treating RN: Huel Coventry Primary Care Jacqualin Shirkey: Karie Fetch Other Clinician: Referring Tryniti Laatsch: Karie Fetch Treating Taquanna Borras/Extender: Altamese [place] in Treatment: 8 Wound Status Wound Number: 3 Primary Venous Leg Ulcer Etiology: Wound Location: Left, Medial, Anterior Lower Leg Wound Open Wounding Event: Gradually Appeared Status: Date Acquired: 07/29/2017 Comorbid Middle ear problems, Hypertension, Peripheral Weeks Of Treatment: 8 History: Venous Disease, Osteoarthritis Clustered Wound: No Photos Wound Measurements Length: (cm) 0.1 Width: (cm) 0.1 Depth: (cm) 0.1 Area: (cm) 0.008 Volume: (cm) 0.001 % Reduction in Area: 99.8% % Reduction in Volume: 99.9% Epithelialization: Large (67-100%) Tunneling: No Undermining: No Wound Description Classification: Partial Thickness Wound Margin: Flat and Intact Exudate Amount: None Present Foul Odor After Cleansing: No Slough/Fibrino No Wound Bed Granulation Amount: None Present (0%) Exposed Structure Necrotic Amount: None Present (0%) Fascia Exposed: No Fat Layer (Subcutaneous Tissue) Exposed: No Tendon Exposed: No Muscle Exposed: No Joint Exposed: No Bone Exposed: No Limited to Skin Breakdown Periwound Skin Texture Texture Color No Abnormalities Noted: No No Abnormalities Noted: No Callus: No Atrophie Blanche: No Crepitus: No Cyanosis: No Excoriation: No Ecchymosis: No Tax, [name] ([id number]) Induration: No Erythema: No Rash: No Hemosiderin Staining: No Scarring: No Mottled:  No Pallor: No Moisture Rubor: No No Abnormalities Noted: No Dry / Scaly: Yes Temperature / Pain Maceration: No Temperature: No Abnormality Wound Preparation Ulcer Cleansing: Rinsed/Irrigated with Saline Topical Anesthetic Applied: Other: lidocaine 4%, Electronic Signature(s) Signed: 12/04/2017 4:30:20 PM By: Elliot Gurney, BSN, RN, CWS, Kim RN, BSN Entered By: Elliot Gurney, BSN, RN, CWS, Kim on 12/04/2017 14:59:58 [name], [name] ([id number]) -------------------------------------------------------------------------------- Vitals Details Patient Name: [name], [name] Date of Service: 12/04/2017 2:30 PM Medical Record Number: [id number] Patient Account Number: [id number] Date of Birth/Sex: 01/30/1956 (61 y.o. Female) Treating RN: Huel Coventry Primary Care Zelina Jimerson: Karie Fetch Other Clinician: Referring Floyed Masoud: Karie Fetch Treating Shavon Ashmore/Extender: Altamese [place] in Treatment: 8 Vital Signs Time Taken: 14:12 Temperature (F): 98.0 Height (in): 60 Pulse (bpm): 69 Weight (lbs): 230 Respiratory Rate (breaths/min): 16 Body Mass Index (BMI): 44.9 Blood Pressure (mmHg): 178/68 Reference Range: 80 - 120 mg / dl Electronic Signature(s) Signed: 12/04/2017 4:30:20 PM By: Elliot Gurney, BSN, RN, CWS, Kim RN, BSN Entered By: Elliot Gurney, BSN, RN, CWS, Kim on 12/04/2017 14:15:22

## 2017-12-07 NOTE — Progress Notes (Signed)
Dunkel, [name] ([id number]) Visit Report for 12/04/2017 HPI Details Patient Name: [name], [name] Date of Service: 12/04/2017 2:30 PM Medical Record Number: [id number] Patient Account Number: [id number] Date of Birth/Sex: Aug 06, 1956 (61 y.o. Female) Treating RN: Huel Coventry Primary Care Provider: Karie Fetch Other Clinician: Referring Provider: Karie Fetch Treating Provider/Extender: Altamese [place] in Treatment: 8 History of Present Illness Location: swelling of the left lower extremity with ulceration Quality: Patient reports experiencing a dull pain to affected area(s). Severity: Patient states wound are getting worse. Duration: Patient has had the wound for > 3 months prior to seeking treatment at the wound center Timing: Pain in wound is Intermittent (comes and goes Context: The wound would happen gradually Modifying Factors: Other treatment(s) tried include:Neosporin ointment locally Associated Signs and Symptoms: Patient reports having increase discharge. HPI Description: 61 year old patient who is known to our practice from a previous visit here in 2015 and 2017 for a similar problem. At that stage, she was lost to follow-up, because she did not have insurance and did not complete a venous ablation. I understand she has now obtained insurance and has an appointment to see Dr. Hart Rochester at [facility] in [place], on November 5th. Other than that the patient has had no other issues 10/18/17 on evaluation today patient appears to be doing okay in regard to her left anterior lower extremity wound. She tells me she does have some discomfort but the wraps that we are performing which is a three layer wrap seems to be of benefit for her. She also has an appointment upcoming with Dr. Hart Rochester from November 5 to discuss options for preventing these type of venous ulcers from occurring in the future. No fevers chills noted. 11/01/2017 -- she was seen by Dr. Hart Rochester  on 10/28/2017, and noted that there was an unsuccessful left great saphenous vein ablation done 3 years ago in [facility]. on doing a venous duplex exam of the left leg patient had a large caliber left great saphenous vein which had rapid cross reflux throughout identified from the proximal calf to the saphenofemoral junction. There was also a large caliber left small saphenous vein. he recommended laser ablation of the left great saphenous vein from the proximal calf to near the saphenofemoral junction as soon as possible. she is willing to proceed 11/08/2017 -- I understand her endovenous ablation has been planned for November 20 and she will see [place] sometime after this 11/25/17 on evaluation today patient appears to be doing well. She states that she has been very pleased with her endovenous ablation which was performed November 20. She has noted some improvement in the swelling of her leg already. With that being said her wound appears to be doing well on evaluation today as well. She has no pain and no slough covering the wound bed. She does inquire about compression for once her wound heals. I discussed that a farrow wrap basic may be beneficial for her considering the wine bottle appearance of her lower extremities. However I would like to give her leg a little bit more time for the swelling to improve before measurement. 12/04/17; the patient's wound has fully epithelialized and she has no open wound area. The areas still looks somewhat threatened on the left leg. She has her own compression stocking for the left leg and will have her put this on this week. Emphasized keeping the skin lubricated. She works at [employer]. She is up on her feet for long periods hopefully  the epithelialization will remain intact. She does not have a stocking for the right leg although she is not had previous wounds in this area. Her ablation was on November 20 ======= Old notes This patient  was been previously seen at the wound center is being seen by me for the first time today. Schnackenberg, [name] ([id number]) 39F with h/o lower extremity swelling presents with a left lower extremity ulcer (first presented to us on 07/12/14) . the patient is deaf and lip reads and is fairly conversant. She has had venous ablation in 2015 but due to lack of insurance and money she has been unable to follow-up with the vascular surgeons not she been wearing compression garments. Does not have a past medical history of diabetes or other issues. He has not been wearing compression stockings and we did try to get her Velcro wraps but she is unable to afford the copayment. Addendum: I have found some reports from August 2015 when an arterial study was done and the left lower extremity had triphasic waveform showing no arterial insufficiency. She was also to found to have left greater saphenous vein reflux throughout with tortuous veins and on 08/27/2014 she had a laser ablation done. At a follow-up on September 8 it was found that she needed a second procedure as a first procedure was not completely successful. Due to the patient's insurance issues she has not gone back for the second procedure. 03/02/2016 -- he is here with the interpreter today and we have had a thorough discussion about all the above and all questions answered. ========== Electronic Signature(s) Signed: 12/04/2017 5:01:55 PM By: Baltazar Najjarobson, Delron Comer MD Entered By: Baltazar Najjarobson, Lasaundra Riche on 12/04/2017 15:09:30 Dennington, [name] ([id number]) -------------------------------------------------------------------------------- Physical Exam Details Patient Name: Kokesh, [name] Date of Service: 12/04/2017 2:30 PM Medical Record Number: [id number] Patient Account Number: [id number] Date of Birth/Sex: [birth year] (61 y.o. Female) Treating RN: Huel CoventryWoody, Kim Primary Care Provider: Karie FetchAYCOCK, NGWE Other Clinician: Referring Provider: Karie FetchAYCOCK,  NGWE Treating Provider/Extender: Altamese CarolinaOBSON, Margaux Engen G Weeks in Treatment: 8 Constitutional Patient is hypertensive.. Pulse regular and within target range for patient.Marland Kitchen. Respirations regular, non-labored and within target range.. Temperature is normal and within the target range for the patient.Marland Kitchen. appears in no distress. Notes wound exam; the patient has no open wound area. Electronic Signature(s) Signed: 12/04/2017 5:01:55 PM By: Baltazar Najjarobson, Alyn [name] MD Entered By: Baltazar Najjarobson, Clarene Curran on 12/04/2017 15:10:05 Fuente, [name] ([id number]) -------------------------------------------------------------------------------- Physician Orders Details Patient Name: Nanni, [name] Date of Service: 12/04/2017 2:30 PM Medical Record Number: [id number] Patient Account Number: [id number] Date of Birth/Sex: [birth year] (61 y.o. Female) Treating RN: Huel CoventryWoody, Kim Primary Care Provider: Karie FetchAYCOCK, NGWE Other Clinician: Referring Provider: Karie FetchAYCOCK, NGWE Treating Provider/Extender: Altamese CarolinaOBSON, Emalyn Schou G Weeks in Treatment: 8 Verbal / Phone Orders: No Diagnosis Coding Wound Cleansing Wound #3 Left,Medial,Anterior Lower Leg o May Shower, gently pat wound dry prior to applying new dressing. Primary Wound Dressing Wound #3 Left,Medial,Anterior Lower Leg o Other: - mepitel lite Follow-up Appointments o Return Appointment in 1 week. Edema Control Wound #3 Left,Medial,Anterior Lower Leg o Other: - Delman CheadleFarrow Wrap Electronic Signature(s) Signed: 12/04/2017 4:30:20 PM By: Elliot GurneyWoody, BSN, RN, CWS, Kim RN, BSN Signed: 12/04/2017 5:01:55 PM By: Baltazar Najjarobson, Aws Shere MD Entered By: Elliot GurneyWoody, BSN, RN, CWS, Kim on 12/04/2017 15:00:51 Ask, [name] ([id number]) -------------------------------------------------------------------------------- Problem List Details Patient Name: Bridgett, [name] Date of Service: 12/04/2017 2:30 PM Medical Record Number: [id number] Patient Account Number: [id number] Date of Birth/Sex:  [birth year] (61 y.o. 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 ([id number]) -------------------------------------------------------------------------------- Progress Note Details Patient Name: Asato, [name] Date of Service: 12/04/2017 2:30 PM Medical Record Number: [id number] Patient Account Number: [id number] Date of Birth/Sex: 11/23/1956 (61 y.o. Female) Treating RN: Huel Coventry Primary Care Provider: Karie Fetch Other Clinician: Referring Provider: Karie Fetch Treating Provider/Extender: Altamese [place] in Treatment: 8 Subjective History of Present Illness (HPI) The following HPI elements were documented for the patient's wound: Location: swelling of the left lower extremity with ulceration Quality: Patient reports experiencing a dull pain to affected area(s). Severity: Patient states wound are getting worse. Duration: Patient has had the wound for > 3 months prior to seeking treatment at the wound center Timing: Pain in wound is Intermittent (comes and goes Context: The wound would happen gradually Modifying Factors: Other treatment(s) tried include:Neosporin ointment locally Associated Signs and Symptoms: Patient reports having increase discharge. 61 year old patient who is known to our practice from a  previous visit here in 2015 and 2017 for a similar problem. At that stage, she was lost to follow-up, because she did not have insurance and did not complete a venous ablation. I understand she has now obtained insurance and has an appointment to see Dr. Hart Rochester at [facility] and vascular in [place], on November 5th. Other than that the patient has had no other issues 10/18/17 on evaluation today patient appears to be doing okay in regard to her left anterior lower extremity wound. She tells me she does have some discomfort but the wraps that we are performing which is a three layer wrap seems to be of benefit for her. She also has an appointment upcoming with Dr. Hart Rochester from November 5 to discuss options for preventing these type of venous ulcers from occurring in the future. No fevers chills noted. 11/01/2017 -- she was seen by Dr. Hart Rochester on 10/28/2017, and noted that there was an unsuccessful left great saphenous vein ablation done 3 years ago in [facility]. on doing a venous duplex exam of the left leg patient had a large caliber left great saphenous vein which had rapid cross reflux throughout identified from the proximal calf to the saphenofemoral junction. There was also a large caliber left small saphenous vein. he recommended laser ablation of the left great saphenous vein from the proximal calf to near the saphenofemoral junction as soon as possible. she is willing to proceed 11/08/2017 -- I understand her endovenous ablation has been planned for November 20 and she will see [place] sometime after this 11/25/17 on evaluation today patient appears to be doing well. She states that she has been very pleased with her endovenous ablation which was performed November 20. She has noted some improvement in the swelling of her leg already. With that being said her wound appears to be doing well on evaluation today as well. She has no pain and no slough covering the wound bed. She does  inquire about compression for once her wound heals. I discussed that a farrow wrap basic may be  beneficial for her considering the wine bottle appearance of her lower extremities. However I would like to give her leg a little bit more time for the swelling to improve before measurement. 12/04/17; the patient's wound has fully epithelialized and she has no open wound area. The areas still looks somewhat threatened on the left leg. She has her own compression stocking for the left leg and will have her put this on this week. Emphasized keeping the skin lubricated. She works at [employer]. She is up on her feet for long periods hopefully the epithelialization will remain intact. She does not have a stocking for the right leg although she is not had previous wounds in this area. Her ablation was on November 20 ======= Old notes This patient was been previously seen at the wound center is being seen by me for the first time today. Presutti, [name] ([id number]) 70F with h/o lower extremity swelling presents with a left lower extremity ulcer (first presented to [place] on 07/12/14) . the patient is deaf and lip reads and is fairly conversant. She has had venous ablation in 2015 but due to lack of insurance and money she has been unable to follow-up with the vascular surgeons not she been wearing compression garments. Does not have a past medical history of diabetes or other issues. He has not been wearing compression stockings and we did try to get her Velcro wraps but she is unable to afford the copayment. Addendum: I have found some reports from August 2015 when an arterial study was done and the left lower extremity had triphasic waveform showing no arterial insufficiency. She was also to found to have left greater saphenous vein reflux throughout with tortuous veins and on 08/27/2014 she had a laser ablation done. At a follow-up on September 8 it was found that she needed a second procedure as a  first procedure was not completely successful. Due to the patient's insurance issues she has not gone back for the second procedure. 03/02/2016 -- he is here with the interpreter today and we have had a thorough discussion about all the above and all questions answered. ========== Objective Constitutional Patient is hypertensive.. Pulse regular and within target range for patient.Marland Kitchen Respirations regular, non-labored and within target range.. Temperature is normal and within the target range for the patient.Marland Kitchen appears in no distress. Vitals Time Taken: 2:12 PM, Height: 60 in, Weight: 230 lbs, BMI: 44.9, Temperature: 98.0 F, Pulse: 69 bpm, Respiratory Rate: 16 breaths/min, Blood Pressure: 178/68 mmHg. General Notes: wound exam; the patient has no open wound area. Integumentary (Hair, Skin) Wound #3 status is Open. Original cause of wound was Gradually Appeared. The wound is located on the Left,Medial,Anterior Lower Leg. The wound measures 0.1cm length x 0.1cm width x 0.1cm depth; 0.008cm^2 area and 0.001cm^3 volume. The wound is limited to skin breakdown. There is no tunneling or undermining noted. There is a none present amount of drainage noted. The wound margin is flat and intact. There is no granulation within the wound bed. There is no necrotic tissue within the wound bed. The periwound skin appearance exhibited: Dry/Scaly. The periwound skin appearance did not exhibit: Callus, Crepitus, Excoriation, Induration, Rash, Scarring, Maceration, Atrophie Blanche, Cyanosis, Ecchymosis, Hemosiderin Staining, Mottled, Pallor, Rubor, Erythema. Periwound temperature was noted as No Abnormality. 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 ([id number]) E66.01 -  Morbid (severe) obesity due to excess calories Plan Wound  Cleansing: Wound #3 Left,Medial,Anterior Lower Leg: May Shower, gently pat wound dry prior to applying new dressing. Primary Wound Dressing: Wound #3 Left,Medial,Anterior Lower Leg: Other: - mepitel lite Follow-up Appointments: Return Appointment in 1 week. Edema Control: Wound #3 Left,Medial,Anterior Lower Leg: Other: - Farrow Wrap #1 left anterior leg wound we will use Mepitel on the patient's own stocking. #2 will see her back one time next week to ensure integrity of the skin on the wound and she can be discharged at that point Electronic Signature(s) Signed: 12/04/2017 5:01:55 PM By: Baltazar Najjarobson, Amadi Frady MD Entered By: Baltazar Najjarobson, Lorma Heater on 12/04/2017 15:10:59 Mccurley, [name] ([id number]) -------------------------------------------------------------------------------- SuperBill Details Patient Name: Krumholz, [name] Date of Service: 12/04/2017 Medical Record Number: [id number] Patient Account Number: [id number] Date of Birth/Sex: 11/04/1956 (61 y.o. 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 E66.01 Morbid (severe) obesity due to excess calories Facility Procedures CPT4 Code: [id number] Description: [id number] - WOUND CARE VISIT-LEV 2 EST PT Modifier: Quantity: 1 Physician Procedures CPT4 Code Description: [id number] - WC PHYS LEVEL 2 - EST PT ICD-10 Diagnosis Description L97.222 Non-pressure chronic ulcer of left calf with fat layer expose I83.222 Varicose veins of left lower extremity with both ulcer of cal Modifier: d f and inflammation Quantity: 1 Electronic Signature(s) Signed: 12/04/2017 5:01:55 PM By: Baltazar Najjarobson, Zaynah Chawla  MD Entered By: Baltazar Najjarobson, Amirra Herling on 12/04/2017 15:11:23

## 2017-12-11 ENCOUNTER — Ambulatory Visit: Payer: BLUE CROSS/BLUE SHIELD | Admitting: Internal Medicine

## 2018-04-10 ENCOUNTER — Encounter: Payer: Self-pay | Admitting: Emergency Medicine

## 2018-04-10 ENCOUNTER — Ambulatory Visit
Admission: EM | Admit: 2018-04-10 | Discharge: 2018-04-10 | Disposition: A | Discharge status: Home / Self Care | Payer: BLUE CROSS/BLUE SHIELD | Attending: Emergency Medicine | Admitting: Emergency Medicine

## 2018-04-10 ENCOUNTER — Other Ambulatory Visit: Payer: Self-pay

## 2018-04-10 ENCOUNTER — Ambulatory Visit (INDEPENDENT_AMBULATORY_CARE_PROVIDER_SITE_OTHER): Payer: BLUE CROSS/BLUE SHIELD

## 2018-04-10 DIAGNOSIS — M5441 Lumbago with sciatica, right side: Secondary | ICD-10-CM

## 2018-04-10 DIAGNOSIS — M545 Low back pain: Secondary | ICD-10-CM | POA: Diagnosis not present

## 2018-04-10 DIAGNOSIS — N814 Uterovaginal prolapse, unspecified: Secondary | ICD-10-CM

## 2018-04-10 DIAGNOSIS — N898 Other specified noninflammatory disorders of vagina: Secondary | ICD-10-CM

## 2018-04-10 DIAGNOSIS — M129 Arthropathy, unspecified: Secondary | ICD-10-CM

## 2018-04-10 LAB — WET PREP, GENITAL
Clue Cells Wet Prep HPF POC: NONE SEEN
Sperm: NONE SEEN
Trich, Wet Prep: NONE SEEN
YEAST WET PREP: NONE SEEN

## 2018-04-10 IMAGING — CR DG LUMBAR SPINE COMPLETE 4+V
5 series · 5 of 5 positions shown · non-contrast
Comparison: None.

CLINICAL DATA: Lumbago with right-sided radicular symptoms

EXAM:
LUMBAR SPINE - COMPLETE 4+ VIEW

[l-spine ap]
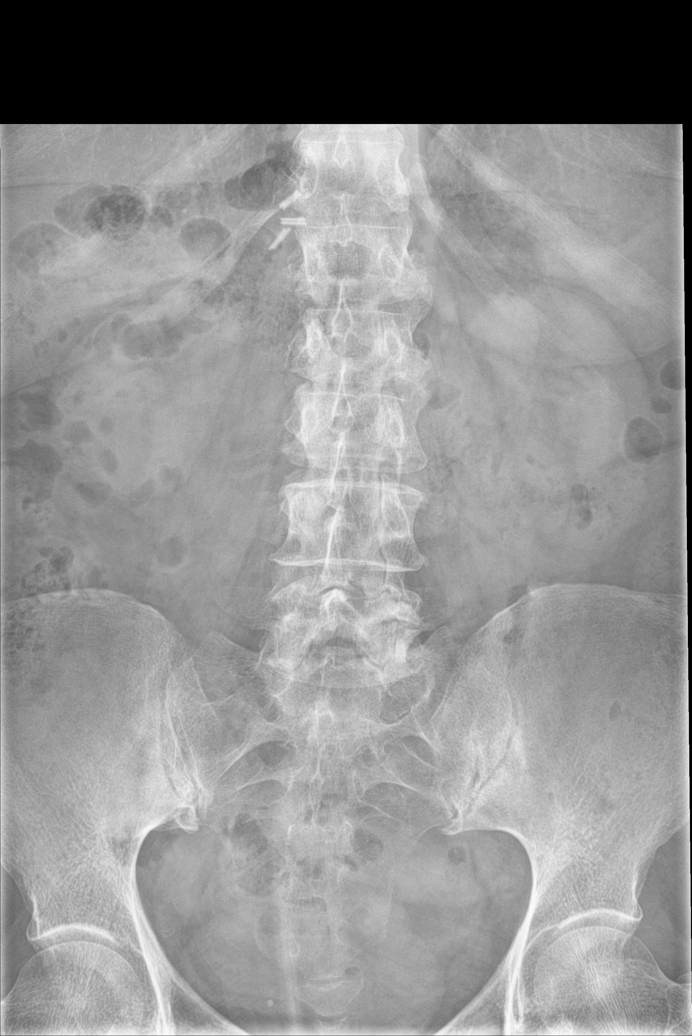

[l-spine obl (1 of 2)]
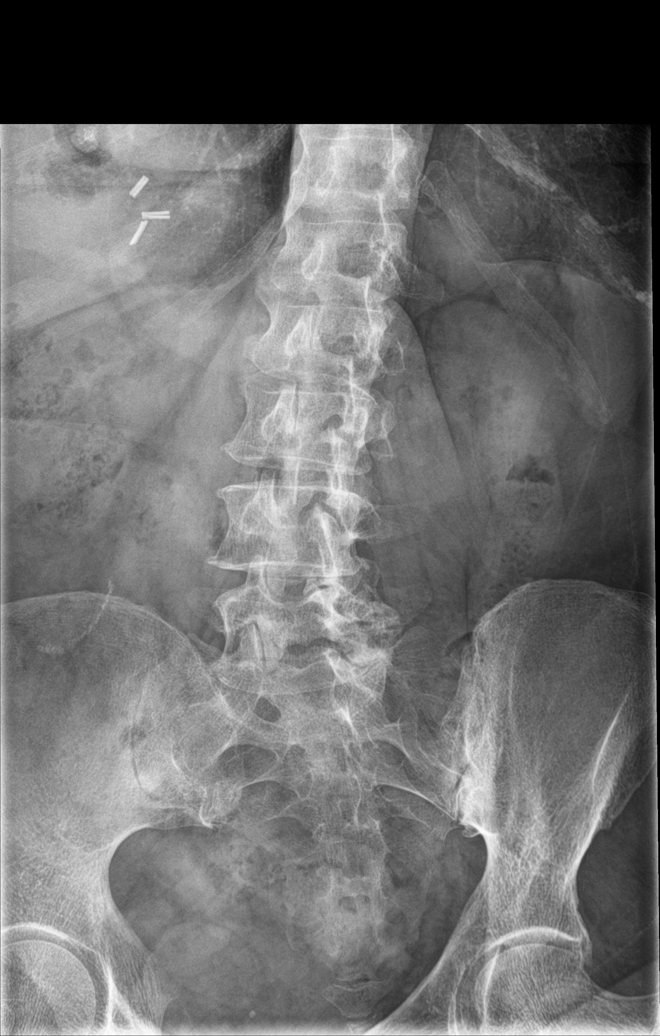

[l-spine obl (2 of 2)]
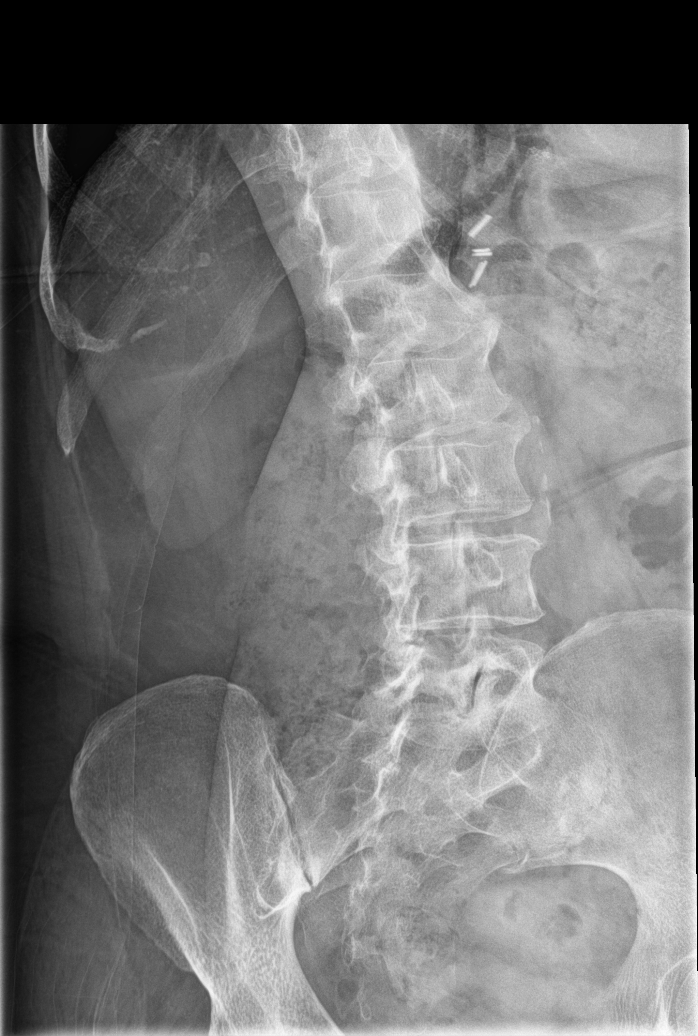

[l-spine lat]
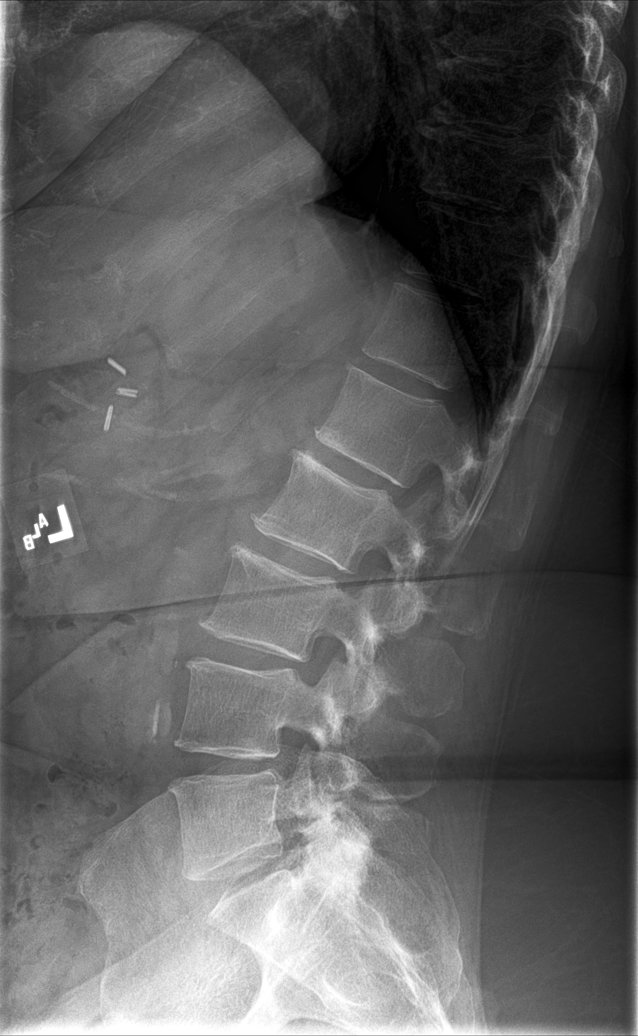

[l-spine spot]
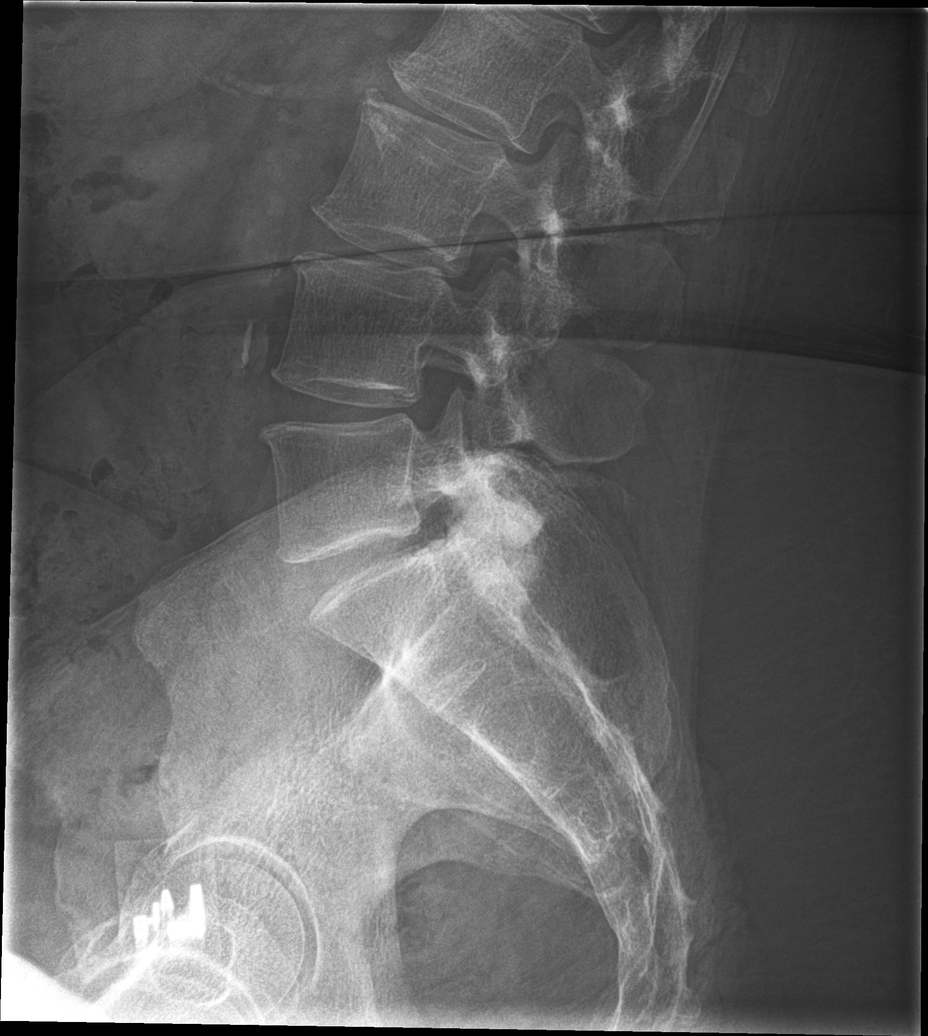

[5 of 5 positions shown; findings below may reference images not displayed]

FINDINGS: Frontal, lateral, spot lumbosacral lateral, and bilateral oblique
views were obtained. There are 5 non-rib-bearing lumbar type
vertebral bodies. There is no fracture or spondylolisthesis. Disc
spaces are unremarkable. There is facet osteoarthritic change at
L4-5 and L5-S1 bilaterally. There is aortic atherosclerosis.
IMPRESSION: Lower lumbar facet arthropathy. No appreciable disc space narrowing.
No fracture or spondylolisthesis. There is aortic atherosclerosis.

Aortic Atherosclerosis ([X7]-[X7]).

## 2018-04-10 MED ORDER — METHOCARBAMOL 750 MG PO TABS: 750.0000 mg | ORAL_TABLET | ORAL | 0 refills | Status: DC

## 2018-04-10 MED ORDER — METHYLPREDNISOLONE 4 MG PO TBPK: ORAL_TABLET | ORAL | 0 refills | Status: DC

## 2018-04-10 MED ORDER — IBUPROFEN 600 MG PO TABS: 600.0000 mg | ORAL_TABLET | Freq: Four times a day (QID) | ORAL | 0 refills | Status: DC | PRN

## 2018-04-10 NOTE — Discharge Instructions (Signed)
600 mg of ibuprofen with 1 g of Tylenol 3-4 times a day as needed for pain.  Try the Robaxin and the steroids. Follow-up with [facility]  for your back and Dr. Gaynelle ArabianShuman for your uterine prolapse, or "squishy thing". Go to the ER for the signs and symptoms we discussed.

## 2018-04-10 NOTE — ED Triage Notes (Addendum)
Patient in today c/o right sided back pain radiating down right leg off & on 3 months. Getting worse and unable to sleep at night.  Patient also states she thinks she may have a yeast infection. Patient has vaginal discharge, no itching.

## 2018-04-10 NOTE — ED Provider Notes (Signed)
HPI  SUBJECTIVE:  [name] is a 62 y.o. female who presents with 2 complaints.  First, she reports right low back pain with radiation down her posterior leg wrapping around to her foot for the past 3 months.  States it is daily, intermittent, lasting hours, present mostly at night after standing on her feet for 8-10 hours a day.  She describes the pain as stabbing.  She denies trauma, heavy lifting. denies fevers, abdominal pain.  No syncope. No saddle anesthesia, distal weakness/numbness, bilateral radicular leg pain/weakness,  bladder/ bowel incontinence, urinary retention.  Pain is worse at night.  No h/o CA / multiple myleoma,  h/o prolonged steroid use, h/o osteopenia, h/o IVDU, h/o HIV, known AAA.  She tried Tylenol, and putting egg crate foam on top of her mattress and new shoes.  The Tylenol helps.  Symptoms worse with lying down and at the end of the day.  She also reports odorous vaginal discharge for the past 3 months.  Reports a painless "squishy thing" in her vagina that gets bigger and smaller.  At times gets smaller after urinating but not consistently so.  There are no aggravating factors.  It does not seem to be associated with Valsalva..  States that she uses oil of olay soap on her genitals, but has been using this for a long time without any problems.  No vaginal itching, burning, rash.  States that she has not been sexually active in 5-6 years.  She has had 4 children.  No abdominal, pelvic pain.  She tried increasing fluids without improvement in her symptoms.  There are no other aggravating or alleviating factors.  Past medical history negative for gonorrhea, chlamydia, herpes, HIV, syphilis, Trichomonas, BV, yeast.  Past medical history also negative for diabetes, hypertension, kidney disease.  ZOX:WRUEAV, Sylvie Farrier, MD   Past Medical History:  Diagnosis Date  . Injury, blood vessel     Past Surgical History:  Procedure Laterality Date  . CHOLECYSTECTOMY    .  ENDOVENOUS ABLATION SAPHENOUS VEIN W/ LASER Left 11/12/2017   endovenous laser ablation left greater saphenous vein by Josephina Gip MD   . VASCULAR SURGERY      Family History  Problem Relation Age of Onset  . Heart disease Mother   . Diabetes Mother   . Cancer Father   . Heart disease Father   . Hypertension Father     Social History   Tobacco Use  . Smoking status: Never Smoker  . Smokeless tobacco: Never Used  Substance Use Topics  . Alcohol use: No  . Drug use: No    No current facility-administered medications for this encounter.   Current Outpatient Medications:  .  ibuprofen (ADVIL,MOTRIN) 600 MG tablet, Take 1 tablet (600 mg total) by mouth every 6 (six) hours as needed., Disp: 30 tablet, Rfl: 0 .  methocarbamol (ROBAXIN) 750 MG tablet, Take 1 tablet (750 mg total) by mouth every 4 (four) hours., Disp: 40 tablet, Rfl: 0 .  methylPREDNISolone (MEDROL DOSEPAK) 4 MG TBPK tablet, follow package directions, Disp: 21 tablet, Rfl: 0  Allergies  Allergen Reactions  . Codeine   . Septra Ds [Sulfamethoxazole-Trimethoprim] Other (See Comments)    Nausea   And stomach burning  . Tramadol Other (See Comments)    Makes me feel loopy       ROS  As noted in HPI.   Physical Exam  BP (!) 120/100 (BP Location: Left Arm)   Pulse (!) 57   Temp  97.9 F (36.6 C) (Oral)   Resp 16   Ht 5' (1.524 m)   Wt 230 lb (104.3 kg)   SpO2 100%   BMI 44.92 kg/m   Constitutional: Well developed, well nourished, no acute distress Eyes:  EOMI, conjunctiva normal bilaterally HENT: Normocephalic, atraumatic,mucus membranes moist Respiratory: Normal inspiratory effort Cardiovascular: Normal rate GI: nondistended. No suprapubic tenderness skin: No rash, skin intact GU: Normal external genitalia.  Normal urethra.  Positive uterine prolapse.  OS normal.  No ulcerations.  Minimal clear discharge.  Chaperone present during exam Musculoskeletal: no CVAT. + Right-sided paralumbar tenderness,  + mild muscle spasm. No bony tenderness.  No Tenderness at the SI joints bilateral lower extremities baseline ROM with intact DP pulses, No pain with passive Int/ext rotation flex/extension hips bilaterally.  Pain aggravated with active hip flexion on the right.  SLR + R side bilaterally. Sensation baseline light touch bilaterally for Pt, DTR's symmetric and intact bilaterally KJ, Motor symmetric bilateral 5/5 hip flexion, quadriceps, hamstrings, EHL, foot dorsiflexion, foot plantarflexion, gait somewhat antalgic but without apparent new ataxia. Neurologic: Alert & oriented x 3, no focal neuro deficits Psychiatric: Speech and behavior appropriate   ED Course   Medications - No data to display  Orders Placed This Encounter  Procedures  . Wet prep, genital    Standing Status:   Standing    Number of Occurrences:   1  . DG Lumbar Spine Complete    Standing Status:   Standing    Number of Occurrences:   1    Order Specific Question:   Reason for Exam (SYMPTOM  OR DIAGNOSIS REQUIRED)    Answer:   R sided back pain with sciatica 3 months  . Ambulatory referral to Obstetrics / Gynecology    Referral Priority:   Routine    Referral Type:   Consultation    Referral Reason:   Specialty Services Required    Requested Specialty:   Obstetrics and Gynecology    Number of Visits Requested:   1    Results for orders placed or performed during the hospital encounter of 04/10/18 (from the past 24 hour(s))  Wet prep, genital     Status: Abnormal   Collection Time: 04/10/18  9:59 AM  Result Value Ref Range   Yeast Wet Prep HPF POC NONE SEEN NONE SEEN   Trich, Wet Prep NONE SEEN NONE SEEN   Clue Cells Wet Prep HPF POC NONE SEEN NONE SEEN   WBC, Wet Prep HPF POC FEW (A) NONE SEEN   Sperm NONE SEEN    Dg Lumbar Spine Complete  Result Date: 04/10/2018 CLINICAL DATA:  Lumbago with right-sided radicular symptoms EXAM: LUMBAR SPINE - COMPLETE 4+ VIEW COMPARISON:  None. FINDINGS: Frontal, lateral, spot  lumbosacral lateral, and bilateral oblique views were obtained. There are 5 non-rib-bearing lumbar type vertebral bodies. There is no fracture or spondylolisthesis. Disc spaces are unremarkable. There is facet osteoarthritic change at L4-5 and L5-S1 bilaterally. There is aortic atherosclerosis. IMPRESSION: Lower lumbar facet arthropathy. No appreciable disc space narrowing. No fracture or spondylolisthesis. There is aortic atherosclerosis. Aortic Atherosclerosis (ICD10-I70.0). Electronically Signed   By: Bretta Bang III M.D.   On: 04/10/2018 09:58    ED Clinical Impression  Right-sided low back pain with right-sided sciatica, unspecified chronicity  Uterine prolapse   ED Assessment/Plan  1.  Back pain- patient was consistent with a musculoskeletal cause of her back pain.  Suspect osteoarthritis at L5, S1.  Given its duration, seriously doubt  UTI, nephrolithiasis, so UA was not done.  However due to the duration of symptoms, L-spine films were obtained.  reviiewed Imaging independently.  Facet osteoarthritic changes at L4-L5 and L5-S1.  Aortic atherosclerosis.  See radiology report for details  Presentation consistent with osteoarthritis.  Plan to send home with ibuprofen 600 mg to take with 1 g of Tylenol 3-4 times a day, Robaxin because she does have muscular tenderness and a Medrol Dosepak.  She will need to follow-up with [facility] and/or her primary care physician.  2.  Vaginal discharge-will also check a wet prep.  She appears to have a prolapsed uterus and I suspect that is what is causing her discharge.  Will refer to GYN for repair. Dr. Jerene PitchSchuman on call.  Do not think that we need to check for gonorrhea and chlamydia as she has not been sexually active in 5-6 years.  Wet prep negative.   Discussed imaging, labs, medical decision-making, and plan for follow-up with the patient.  Discussed signs and symptoms that should prompt return to the emergency department.  Patient  agrees with plan.  Meds ordered this encounter  Medications  . methylPREDNISolone (MEDROL DOSEPAK) 4 MG TBPK tablet    Sig: follow package directions    Dispense:  21 tablet    Refill:  0  . methocarbamol (ROBAXIN) 750 MG tablet    Sig: Take 1 tablet (750 mg total) by mouth every 4 (four) hours.    Dispense:  40 tablet    Refill:  0  . ibuprofen (ADVIL,MOTRIN) 600 MG tablet    Sig: Take 1 tablet (600 mg total) by mouth every 6 (six) hours as needed.    Dispense:  30 tablet    Refill:  0    *This clinic note was created using Scientist, clinical (histocompatibility and immunogenetics)Dragon dictation software. Therefore, there may be occasional mistakes despite careful proofreading.  ?    Domenick GongMortenson, Emogene Muratalla, MD 04/10/18 1114

## 2018-10-07 ENCOUNTER — Encounter: Payer: Self-pay | Admitting: Emergency Medicine

## 2018-10-07 ENCOUNTER — Other Ambulatory Visit: Payer: Self-pay

## 2018-10-07 ENCOUNTER — Ambulatory Visit
Admission: EM | Admit: 2018-10-07 | Discharge: 2018-10-07 | Disposition: A | Discharge status: Home / Self Care | Payer: BLUE CROSS/BLUE SHIELD | Attending: Family Medicine | Admitting: Family Medicine

## 2018-10-07 DIAGNOSIS — S80861A Insect bite (nonvenomous), right lower leg, initial encounter: Secondary | ICD-10-CM | POA: Diagnosis not present

## 2018-10-07 DIAGNOSIS — S80862A Insect bite (nonvenomous), left lower leg, initial encounter: Secondary | ICD-10-CM | POA: Diagnosis not present

## 2018-10-07 DIAGNOSIS — W57XXXA Bitten or stung by nonvenomous insect and other nonvenomous arthropods, initial encounter: Secondary | ICD-10-CM | POA: Diagnosis not present

## 2018-10-07 DIAGNOSIS — S80869A Insect bite (nonvenomous), unspecified lower leg, initial encounter: Secondary | ICD-10-CM

## 2018-10-07 DIAGNOSIS — L03115 Cellulitis of right lower limb: Secondary | ICD-10-CM

## 2018-10-07 MED ORDER — MUPIROCIN 2 % EX OINT: TOPICAL_OINTMENT | CUTANEOUS | 0 refills | Status: DC

## 2018-10-07 MED ORDER — TETANUS-DIPHTH-ACELL PERTUSSIS 5-2.5-18.5 LF-MCG/0.5 IM SUSP
0.5000 mL | Freq: Once | INTRAMUSCULAR | Status: AC
  Administered 2018-10-07: 0.5 mL via INTRAMUSCULAR

## 2018-10-07 MED ORDER — CEPHALEXIN 500 MG PO CAPS: 500.0000 mg | ORAL_CAPSULE | Freq: Four times a day (QID) | ORAL | 0 refills | Status: AC

## 2018-10-07 NOTE — ED Provider Notes (Addendum)
[facility] ____________________________________________  Time seen: Approximately 11:18 AM  I have reviewed the triage vital signs and the nursing notes.   HISTORY  Chief Complaint Insect Bite   HPI [name] is a 62 y.o. female presenting for evaluation of ant bites to bilateral lower extremities that occurred this past Sunday afternoon.  Patient states that she accidentally stepped out of her car into an ant hill.  States that she had numerous aunts on both of her lower legs and later noticed the bites.  States the area is itchy and mildly tender.  Denies any drainage.  States that she noticed some of the areas are very red prompting her to come in.  Did apply some Neosporin after cleaning with alcohol.  Denies other out alleviating measures attempted.  Denies other aggravating factors.  Denies pain at this time.  Reports that she had a vascular surgery to her left lower leg approximately 1 year ago when a blood vessel was injured "and the blood did not go anywhere it was getting infected ".  Reports no other vascular issues to her lower extremities since.  Denies paresthesias, swelling, pain with ambulation, fevers or other complaints.  Did have chills last night.  Reports otherwise feels well denies other complaints.  Unsure of last tetanus immunization.  Denies chest pain, shortness of breath, difficulty swallowing, edema.  Denies recent antibiotic use.  Emogene Morgan, MD: PCP  Past Medical History:  Diagnosis Date  . Injury, blood vessel     Patient Active Problem List   Diagnosis Date Noted  . Varicose veins of left lower extremity with complications 10/28/2017  . Venous ulcer of left leg (HCC) 02/09/2016    Past Surgical History:  Procedure Laterality Date  . CHOLECYSTECTOMY    . ENDOVENOUS ABLATION SAPHENOUS VEIN W/ LASER Left 11/12/2017   endovenous laser ablation left greater saphenous vein by Josephina Gip MD   . VASCULAR SURGERY         Current Facility-Administered Medications:  .  Tdap (BOOSTRIX) injection 0.5 mL, 0.5 mL, Intramuscular, Once, Renford Dills, NP  Current Outpatient Medications:  .  cephALEXin (KEFLEX) 500 MG capsule, Take 1 capsule (500 mg total) by mouth 4 (four) times daily for 7 days., Disp: 28 capsule, Rfl: 0 .  mupirocin ointment (BACTROBAN) 2 %, Apply two times a day for 7 days., Disp: 22 g, Rfl: 0  Allergies Codeine; Septra ds [sulfamethoxazole-trimethoprim]; and Tramadol  Family History  Problem Relation Age of Onset  . Heart disease Mother   . Diabetes Mother   . Cancer Father   . Heart disease Father   . Hypertension Father     Social History Social History   Tobacco Use  . Smoking status: Never Smoker  . Smokeless tobacco: Never Used  Substance Use Topics  . Alcohol use: No  . Drug use: No    Review of Systems Constitutional: No fever Cardiovascular: Denies chest pain. Respiratory: Denies shortness of breath. Gastrointestinal: No abdominal pain.   Musculoskeletal: Negative for back pain. Skin: as above.   ____________________________________________   PHYSICAL EXAM:  VITAL SIGNS: ED Triage Vitals  Enc Vitals Group     BP 10/07/18 1053 (!) 141/64     Pulse Rate 10/07/18 1053 69     Resp 10/07/18 1053 18     Temp 10/07/18 1053 97.9 F (36.6 C)     Temp Source 10/07/18 1053 Oral     SpO2 10/07/18 1053 99 %  Weight 10/07/18 1052 250 lb (113.4 kg)     Height 10/07/18 1052 5' (1.524 m)     Head Circumference --      Peak Flow --      Pain Score 10/07/18 1051 1     Pain Loc --      Pain Edu? --      Excl. in GC? --     Constitutional: Alert and oriented. Well appearing and in no acute distress. ENT      Head: Normocephalic and atraumatic. Cardiovascular: Normal rate, regular rhythm. Grossly normal heart sounds.  Good peripheral circulation. Respiratory: Normal respiratory effort without tachypnea nor retractions. Breath sounds are clear and equal  bilaterally. No wheezes, rales, rhonchi. Musculoskeletal:   Bilateral lower extremities nontender no edema noted.  Bilateral pedal pulses equal and easily palpated. Except: Bilateral lower extremities numerous mildly erythematous pustules less than 0.5 cm along ankles and dorsal feet right more than left, no drainage, no surrounding erythema and nontender.  Right lower lateral ankle approximately 0.5 cm pustule present with mild to moderate immediate surrounding erythema approximately 3 cm in diameter, nontender, no drainage, bilateral lower extremities nontender. Neurologic:  Normal speech and language. Speech is normal. No gait instability.  Skin:  Skin is warm, dry.  As above Psychiatric: Mood and affect are normal. Speech and behavior are normal. Patient exhibits appropriate insight and judgment   ___________________________________________   LABS (all labs ordered are listed, but only abnormal results are displayed)  Labs Reviewed - No data to display   PROCEDURES Procedures    INITIAL IMPRESSION / ASSESSMENT AND PLAN / ED COURSE  Pertinent labs & imaging results that were available during my care of the patient were reviewed by me and considered in my medical decision making (see chart for details).  Well-appearing patient.  No acute distress.  Numerous ant bites to bilateral lower extremities.  Tetanus immunization updated.  Appearance of secondary cellulitis to right lateral ankle bite, will treat with oral Keflex and topical Bactroban.  Encourage use of over-the-counter hydrocortisone topical as well.  Avoidance of scratching, keeping clean and monitoring.Discussed indication, risks and benefits of medications with patient.  Discussed follow up with Primary care physician this week. Discussed follow up and return parameters including no resolution or any worsening concerns. Patient verbalized understanding and agreed to plan.    ____________________________________________   FINAL CLINICAL IMPRESSION(S) / ED DIAGNOSES  Final diagnoses:  Insect bite of lower leg, unspecified laterality, initial encounter  Cellulitis of right lower extremity     ED Discharge Orders         Ordered    cephALEXin (KEFLEX) 500 MG capsule  4 times daily     10/07/18 1111    mupirocin ointment (BACTROBAN) 2 %     10/07/18 1111           Note: This dictation was prepared with Dragon dictation along with smaller phrase technology. Any transcriptional errors that result from this process are unintentional.        Renford Dills, NP 10/07/18 1124

## 2018-10-07 NOTE — Discharge Instructions (Addendum)
Take medication as prescribed. Rest. Drink plenty of fluids.  Topical over-the-counter hydrocortisone cream.  Avoid scratching and itching.  Continue to monitor.  Follow up with your primary care physician this week as needed. Return to Urgent care for new or worsening concerns.

## 2018-10-07 NOTE — ED Triage Notes (Signed)
Patient c/o possible ant bites to bilateral feet yesterday. Patient states the bites burn and itch.

## 2018-11-06 ENCOUNTER — Other Ambulatory Visit: Payer: Self-pay

## 2018-11-06 ENCOUNTER — Ambulatory Visit (INDEPENDENT_AMBULATORY_CARE_PROVIDER_SITE_OTHER): Payer: BLUE CROSS/BLUE SHIELD

## 2018-11-06 ENCOUNTER — Ambulatory Visit
Admission: EM | Admit: 2018-11-06 | Discharge: 2018-11-06 | Disposition: A | Discharge status: Home / Self Care | Payer: BLUE CROSS/BLUE SHIELD | Attending: Family Medicine | Admitting: Family Medicine

## 2018-11-06 DIAGNOSIS — M25512 Pain in left shoulder: Secondary | ICD-10-CM

## 2018-11-06 DIAGNOSIS — S46002A Unspecified injury of muscle(s) and tendon(s) of the rotator cuff of left shoulder, initial encounter: Secondary | ICD-10-CM

## 2018-11-06 IMAGING — CR DG SHOULDER 2+V*L*
3 series · 4 of 4 positions shown · non-contrast
Comparison: None.

CLINICAL DATA: Left shoulder pain over the last 10 days.

EXAM:
LEFT SHOULDER - 2+ VIEW

[shoulder grashey]
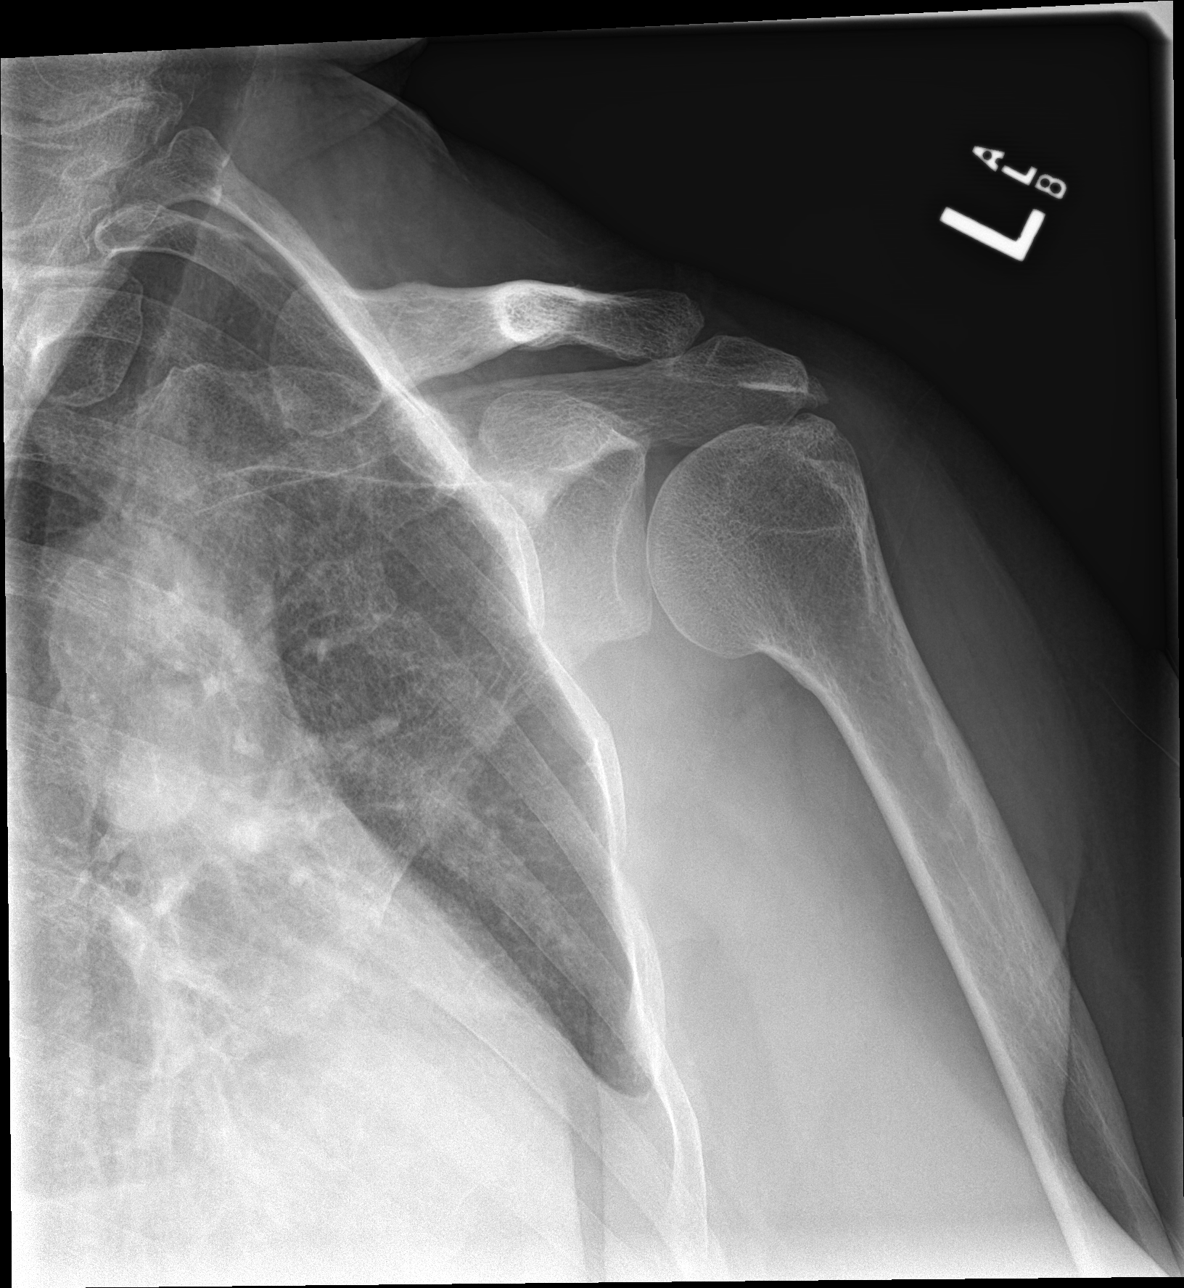

[Series 2: shoulder y view · 0.14mm/px · 2 of 2 slices shown]
[im 1/2]
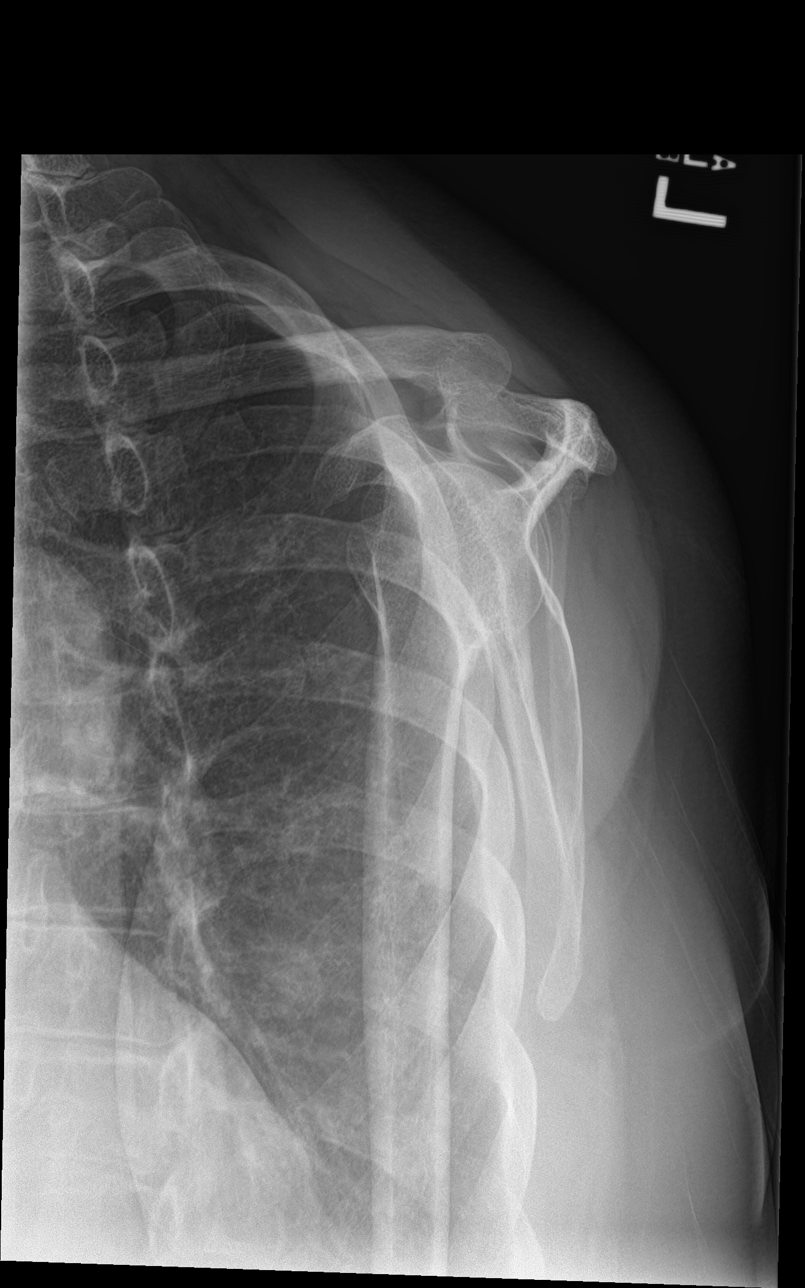
[im 2/2]
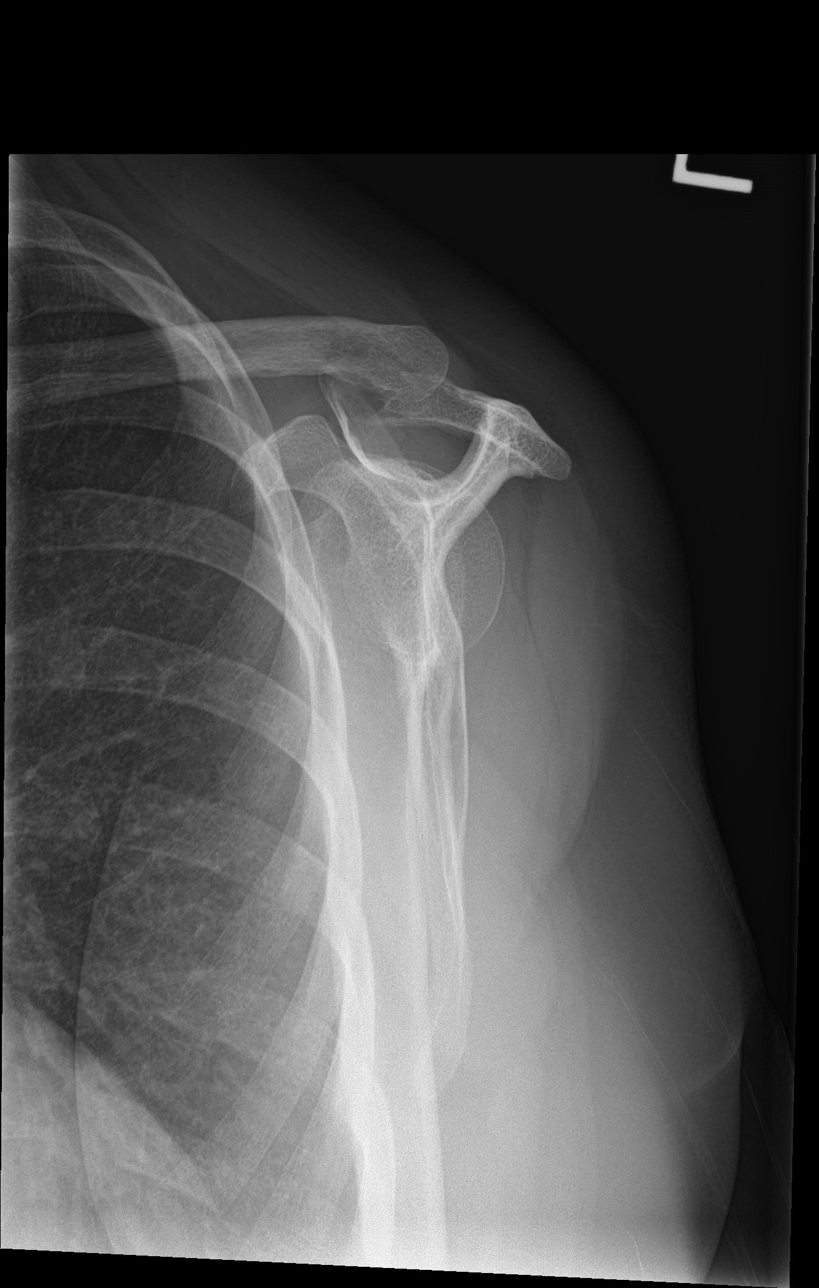

[shoulder axial]
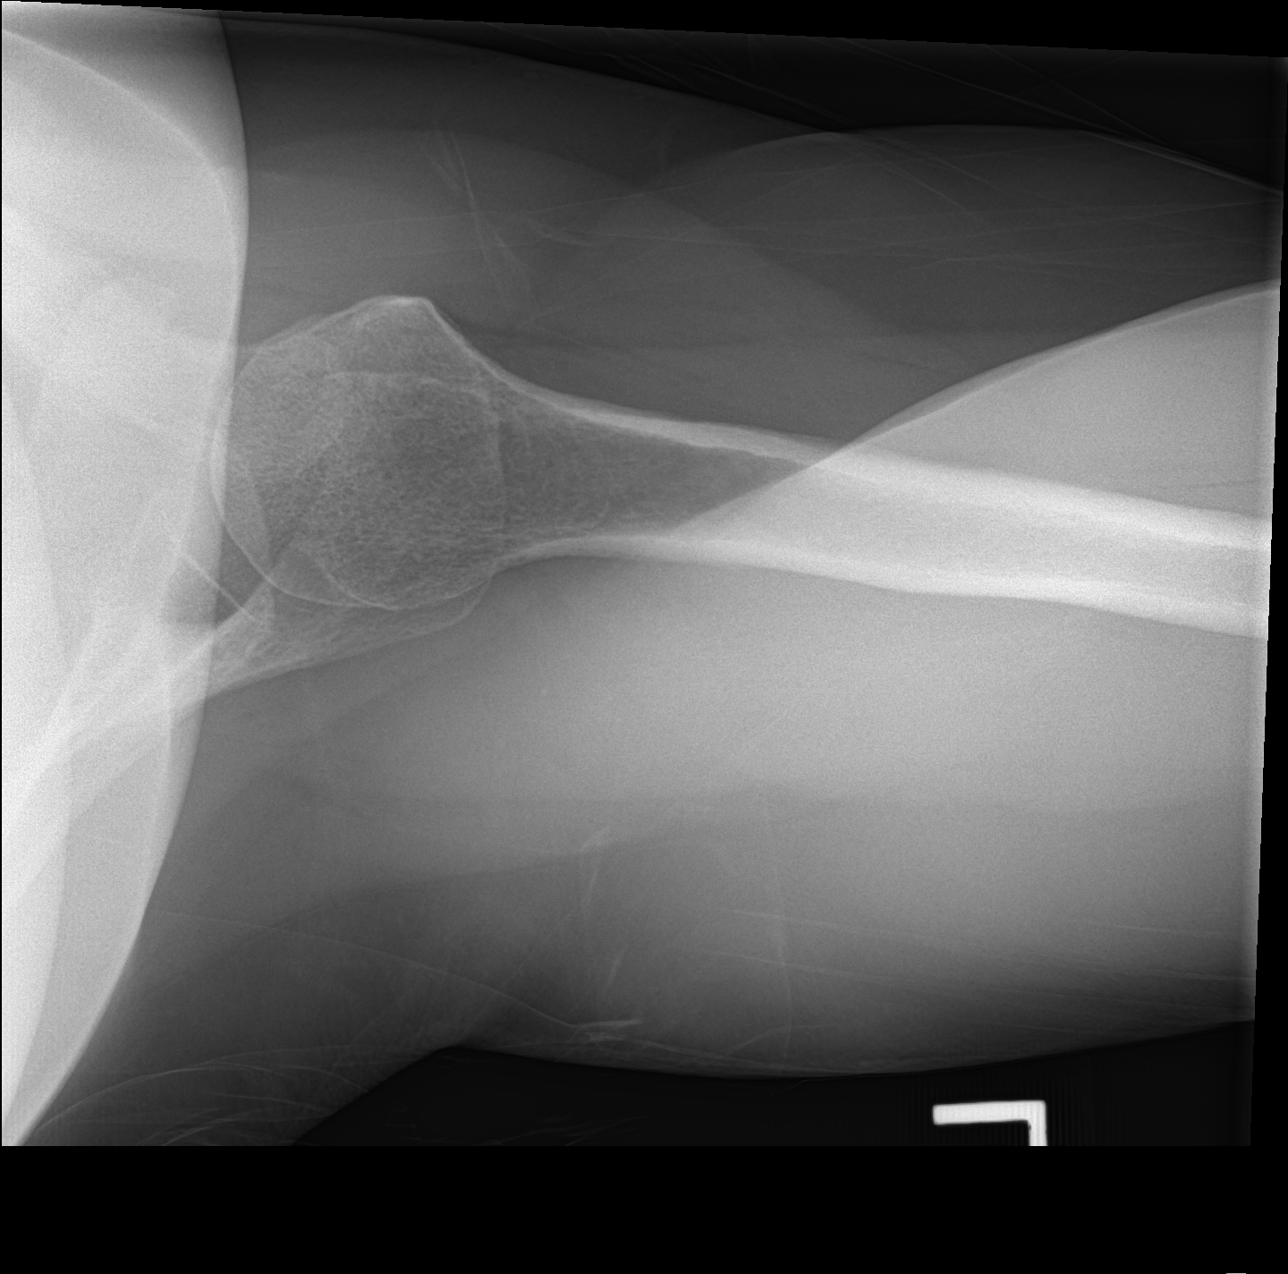

[4 of 4 positions shown; findings below may reference images not displayed]

FINDINGS: The joint itself appears normal. There is a lateral acromial spur
with some narrowing of the humeral acromial distance that could be
associated with rotator cuff disease. No other regional finding.
IMPRESSION: No acute finding.  Question rotator cuff region impinging anatomy.

## 2018-11-06 MED ORDER — CYCLOBENZAPRINE HCL 10 MG PO TABS: 10.0000 mg | ORAL_TABLET | Freq: Two times a day (BID) | ORAL | 0 refills | Status: DC | PRN

## 2018-11-06 MED ORDER — PREDNISONE 10 MG PO TABS: ORAL_TABLET | ORAL | 0 refills | Status: DC

## 2018-11-06 NOTE — ED Triage Notes (Signed)
Patient complains of left arm pain that started around 10 days ago. Patient states that pain is worse when picking up something. Patient states that shoulder has been hurting as well. Patient reports that pain is worse at night at times.

## 2018-11-06 NOTE — ED Provider Notes (Signed)
[facility] ____________________________________________  Time seen: Approximately 11:09 AM  I have reviewed the triage vital signs and the nursing notes.   HISTORY  Chief Complaint Arm Pain (left)   HPI [place] A Blase is a 62 y.o. female presenting for evaluation of left shoulder pain present for the last 2 weeks.  Patient states that she first noticed a mild pain to her left upper arm while lifting and moving things around at work, but states only mild at the time.  Reports that she continued to remain active and continued normal activities the pain gradually increased.  States she has decreased range of motion to her left shoulder with this, especially over last few days.  Denies any fall or direct trauma.  Left hand dominant, with a lot of repetitive frequent arm movements at work and at home.  States pain mostly to upper arm and shoulder but over the last few days she started to feel pain in the area between her neck and left shoulder as well.  States pain is worse with movement.  States if she finds a comfortable position that supports her arm and shoulder pain is minimal but still achy.  States pain is worse at night as she is having difficulty getting comfortable to sleep.  States that she cannot lay on the left arm due to pain increasing.  Denies any further pain radiation.  Denies paresthesias, loss of sensation, rash or swelling.  No accompanying chest pain, shortness of breath, palpitation or pain radiating towards chest.  Continues remain active.  Has applied heat without much change.  Denies history of same.  Denies decreased handgrip.  Reports otherwise doing well denies other complaints.  Denies other alleviating or aggravating factors.  Emogene Morgan, MD:PCP   Past Medical History:  Diagnosis Date  . Injury, blood vessel     Patient Active Problem List   Diagnosis Date Noted  . Varicose veins of left lower extremity with complications 10/28/2017  .  Venous ulcer of left leg (HCC) 02/09/2016    Past Surgical History:  Procedure Laterality Date  . CHOLECYSTECTOMY    . ENDOVENOUS ABLATION SAPHENOUS VEIN W/ LASER Left 11/12/2017   endovenous laser ablation left greater saphenous vein by Josephina Gip MD   . VASCULAR SURGERY       No current facility-administered medications for this encounter.   Current Outpatient Medications:  .  cyclobenzaprine (FLEXERIL) 10 MG tablet, Take 1 tablet (10 mg total) by mouth 2 (two) times daily as needed for muscle spasms. Do not drive while taking as can cause drowsiness, Disp: 15 tablet, Rfl: 0 .  predniSONE (DELTASONE) 10 MG tablet, Start 60 mg po day one, then 50 mg po day two, taper by 10 mg daily until complete., Disp: 21 tablet, Rfl: 0  Allergies Codeine; Septra ds [sulfamethoxazole-trimethoprim]; and Tramadol  Family History  Problem Relation Age of Onset  . Heart disease Mother   . Diabetes Mother   . Cancer Father   . Heart disease Father   . Hypertension Father     Social History Social History   Tobacco Use  . Smoking status: Never Smoker  . Smokeless tobacco: Never Used  Substance Use Topics  . Alcohol use: No  . Drug use: No    Review of Systems Constitutional: No fever Cardiovascular: Denies chest pain. Respiratory: Denies shortness of breath. Gastrointestinal: No abdominal pain. Genitourinary: Negative for dysuria. Musculoskeletal: Negative for back pain. As above.  Skin: Negative for rash. Neurological: Negative  for headaches, focal weakness or numbness.  ____________________________________________   PHYSICAL EXAM:  VITAL SIGNS: ED Triage Vitals  Enc Vitals Group     BP 11/06/18 1030 (!) 155/82     Pulse Rate 11/06/18 1030 (!) 53     Resp 11/06/18 1030 18     Temp 11/06/18 1030 98 F (36.7 C)     Temp Source 11/06/18 1030 Oral     SpO2 11/06/18 1030 100 %     Weight 11/06/18 1027 250 lb (113.4 kg)     Height 11/06/18 1027 5' (1.524 m)     Head  Circumference --      Peak Flow --      Pain Score 11/06/18 1027 5     Pain Loc --      Pain Edu? --      Excl. in GC? --     Constitutional: Alert and oriented. Well appearing and in no acute distress. Eyes: Conjunctivae are normal. ENT      Head: Normocephalic and atraumatic. Hematological/Lymphatic/Immunilogical: No cervical lymphadenopathy. Cardiovascular: Normal rate, regular rhythm. Grossly normal heart sounds.  Good peripheral circulation. Respiratory: Normal respiratory effort without tachypnea nor retractions. Breath sounds are clear and equal bilaterally. No wheezes, rales, rhonchi. Gastrointestinal:No CVA tenderness. Musculoskeletal:  No midline cervical, thoracic or lumbar tenderness to palpation. Bilateral distal radial pulses equal and easily palpated.  Bilateral hand grip strong and equal. Except: Left proximal humerus along deltoid and inferior AC joint to posterior inferior AC joint moderate tenderness to direct palpation, mild tenderness to palpation anterior inferior AC joint, no swelling, no ecchymosis, pain with lateral abduction and unable to abduct greater than 90 degrees, positive empty can test, negative drop arm test, no paresthesias.  Left trapezius mild to moderate tenderness to direct palpation, no point bony tenderness to scapula or midline.  No rash. Neurologic:  Normal speech and language. No gross focal neurologic deficits are appreciated. Speech is normal. No gait instability.  Skin:  Skin is warm, dry and intact. No rash noted. Psychiatric: Mood and affect are normal. Speech and behavior are normal. Patient exhibits appropriate insight and judgment   ___________________________________________   LABS (all labs ordered are listed, but only abnormal results are displayed)  Labs Reviewed - No data to display  RADIOLOGY  Dg Shoulder Left  Result Date: 11/06/2018 CLINICAL DATA:  Left shoulder pain over the last 10 days. EXAM: LEFT SHOULDER - 2+ VIEW  COMPARISON:  None. FINDINGS: The joint itself appears normal. There is a lateral acromial spur with some narrowing of the humeral acromial distance that could be associated with rotator cuff disease. No other regional finding. IMPRESSION: No acute finding.  Question rotator cuff region impinging anatomy. Electronically Signed   By: Paulina Fusi M.D.   On: 11/06/2018 11:36   ____________________________________________   PROCEDURES Procedures   INITIAL IMPRESSION / ASSESSMENT AND PLAN / ED COURSE  Pertinent labs & imaging results that were available during my care of the patient were reviewed by me and considered in my medical decision making (see chart for details).  Well-appearing patient.  No acute distress.  Left shoulder pain as above.  Patient reports pain is fully reproducible by direct palpation and range of motion.  Suspect left shoulder tendinitis, concern for rotator cuff tendinitis.  Left x-ray as above per radiologist, no acute finding, question from radiologist rotator cuff region impinging anatomy.  Discussed ROM exercises.  Will treat with prednisone and PRN Flexeril.  Encourage follow-up with orthopedic as  may need physical therapy.  Discussed strict follow-up and return parameters.  Discussed follow up with Primary care physician this week. Discussed follow up and return parameters including no resolution or any worsening concerns. Patient verbalized understanding and agreed to plan.   ____________________________________________   FINAL CLINICAL IMPRESSION(S) / ED DIAGNOSES  Final diagnoses:  Acute pain of left shoulder  Injury of left rotator cuff, initial encounter     ED Discharge Orders         Ordered    predniSONE (DELTASONE) 10 MG tablet     11/06/18 1149    cyclobenzaprine (FLEXERIL) 10 MG tablet  2 times daily PRN     11/06/18 1149           Note: This dictation was prepared with Dragon dictation along with smaller phrase technology. Any  transcriptional errors that result from this process are unintentional.         Renford DillsMiller, Damari Hiltz, NP 11/06/18 1442

## 2018-11-06 NOTE — Discharge Instructions (Signed)
Take medication as prescribed. Rest. Drink plenty of fluids. Stretch.   Follow up with orthopedic as discussed. Call to schedule.  Follow up with your primary care physician this week as needed. Return to Urgent care for new or worsening concerns.

## 2019-01-06 ENCOUNTER — Other Ambulatory Visit: Payer: Self-pay | Admitting: Family Medicine

## 2019-01-06 DIAGNOSIS — Z1231 Encounter for screening mammogram for malignant neoplasm of breast: Secondary | ICD-10-CM

## 2020-05-30 ENCOUNTER — Ambulatory Visit
Admission: EM | Admit: 2020-05-30 | Discharge: 2020-05-30 | Disposition: A | Discharge status: Home / Self Care | Payer: 59 | Attending: Family Medicine | Admitting: Family Medicine

## 2020-05-30 ENCOUNTER — Other Ambulatory Visit: Payer: Self-pay

## 2020-05-30 DIAGNOSIS — B373 Candidiasis of vulva and vagina: Secondary | ICD-10-CM | POA: Diagnosis not present

## 2020-05-30 DIAGNOSIS — R748 Abnormal levels of other serum enzymes: Secondary | ICD-10-CM | POA: Insufficient documentation

## 2020-05-30 DIAGNOSIS — R7989 Other specified abnormal findings of blood chemistry: Secondary | ICD-10-CM | POA: Insufficient documentation

## 2020-05-30 DIAGNOSIS — B3731 Acute candidiasis of vulva and vagina: Secondary | ICD-10-CM

## 2020-05-30 LAB — CBC WITH DIFFERENTIAL/PLATELET
Abs Immature Granulocytes: 0.01 10*3/uL (ref 0.00–0.07)
Basophils Absolute: 0 10*3/uL (ref 0.0–0.1)
Basophils Relative: 1 %
Eosinophils Absolute: 0.1 10*3/uL (ref 0.0–0.5)
Eosinophils Relative: 1 %
HCT: 45.4 % (ref 36.0–46.0)
Hemoglobin: 15.1 g/dL — ABNORMAL HIGH (ref 12.0–15.0)
Immature Granulocytes: 0 %
Lymphocytes Relative: 32 %
Lymphs Abs: 1.8 10*3/uL (ref 0.7–4.0)
MCH: 31.1 pg (ref 26.0–34.0)
MCHC: 33.3 g/dL (ref 30.0–36.0)
MCV: 93.6 fL (ref 80.0–100.0)
Monocytes Absolute: 0.4 10*3/uL (ref 0.1–1.0)
Monocytes Relative: 8 %
Neutro Abs: 3.3 10*3/uL (ref 1.7–7.7)
Neutrophils Relative %: 58 %
Platelets: 285 10*3/uL (ref 150–400)
RBC: 4.85 MIL/uL (ref 3.87–5.11)
RDW: 12.5 % (ref 11.5–15.5)
WBC: 5.6 10*3/uL (ref 4.0–10.5)
nRBC: 0 % (ref 0.0–0.2)

## 2020-05-30 LAB — COMPREHENSIVE METABOLIC PANEL
ALT: 432 U/L — ABNORMAL HIGH (ref 0–44)
AST: 446 U/L — ABNORMAL HIGH (ref 15–41)
Albumin: 4 g/dL (ref 3.5–5.0)
Alkaline Phosphatase: 161 U/L — ABNORMAL HIGH (ref 38–126)
Anion gap: 7 (ref 5–15)
BUN: 14 mg/dL (ref 8–23)
CO2: 28 mmol/L (ref 22–32)
Calcium: 9.3 mg/dL (ref 8.9–10.3)
Chloride: 103 mmol/L (ref 98–111)
Creatinine, Ser: 0.74 mg/dL (ref 0.44–1.00)
GFR calc Af Amer: >60 mL/min (ref >60)
GFR calc non Af Amer: >60 mL/min (ref >60)
Glucose, Bld: 95 mg/dL (ref 70–99)
Potassium: 4.5 mmol/L (ref 3.5–5.1)
Sodium: 138 mmol/L (ref 135–145)
Total Bilirubin: 0.7 mg/dL (ref 0.3–1.2)
Total Protein: 7.8 g/dL (ref 6.5–8.1)

## 2020-05-30 LAB — WET PREP, GENITAL
Clue Cells Wet Prep HPF POC: NONE SEEN
Sperm: NONE SEEN
Trich, Wet Prep: NONE SEEN

## 2020-05-30 LAB — LIPASE, BLOOD: Lipase: 25 U/L (ref 11–51)

## 2020-05-30 LAB — GAMMA GT: GGT: 156 U/L — ABNORMAL HIGH (ref 7–50)

## 2020-05-30 MED ORDER — ONDANSETRON 8 MG PO TBDP
8.0000 mg | ORAL_TABLET | Freq: Once | ORAL | Status: AC
  Administered 2020-05-30: 8 mg via ORAL

## 2020-05-30 MED ORDER — ONDANSETRON HCL 4 MG PO TABS: 4.0000 mg | ORAL_TABLET | Freq: Three times a day (TID) | ORAL | 0 refills | Status: DC | PRN

## 2020-05-30 MED ORDER — FLUCONAZOLE 150 MG PO TABS: 150.0000 mg | ORAL_TABLET | Freq: Once | ORAL | 0 refills | Status: AC

## 2020-05-30 NOTE — ED Triage Notes (Signed)
Pt states "I think I have a yeast infection." Vaginal discharge with some odor." Headache x past month off and on. Sometimes has nausea.

## 2020-05-30 NOTE — ED Provider Notes (Addendum)
[facility]    CSN: [id number] Arrival date & time: 05/30/20  1007      History   Chief Complaint Chief Complaint  Patient presents with  . Headache  . Vaginal Discharge   HPI  64 year old female presents with nausea, intermittent headaches, and vaginal discharge.  Patient reports that she has had ongoing headaches for the past 2 months.  Occur intermittently.  Seem to respond to Tylenol.  Patient has also had ongoing nausea over the past 2 months.  No reports of vomiting.  Denies abdominal pain.  She does state that she has times where she feels bloated and distended.  Patient states that she does not feel well and believes that there is something underlying which is causing her symptoms.  Patient also reports ongoing vaginal discharge for the past 8 to 9 months.  Recent development of vaginal itching.  She is concerned that she has a yeast infection.  No other reported symptoms.  No other complaints.  Past Medical History:  Diagnosis Date  . Injury, blood vessel     Patient Active Problem List   Diagnosis Date Noted  . Varicose veins of left lower extremity with complications 10/28/2017  . Venous ulcer of left leg (HCC) 02/09/2016    Past Surgical History:  Procedure Laterality Date  . CHOLECYSTECTOMY    . ENDOVENOUS ABLATION SAPHENOUS VEIN W/ LASER Left 11/12/2017   endovenous laser ablation left greater saphenous vein by Josephina Gip MD   . VASCULAR SURGERY      OB History   No obstetric history on file.      Home Medications    Prior to Admission medications   Medication Sig Start Date End Date Taking? Authorizing Provider  fluconazole (DIFLUCAN) 150 MG tablet Take 1 tablet (150 mg total) by mouth once for 1 dose. Repeat dose in 72 hours. 05/30/20 05/30/20  Tommie Sams, DO  ondansetron (ZOFRAN) 4 MG tablet Take 1 tablet (4 mg total) by mouth every 8 (eight) hours as needed for nausea or vomiting. 05/30/20   Tommie Sams, DO    Family History Family  History  Problem Relation Age of Onset  . Heart disease Mother   . Diabetes Mother   . Cancer Father   . Heart disease Father   . Hypertension Father     Social History Social History   Tobacco Use  . Smoking status: Never Smoker  . Smokeless tobacco: Never Used  Substance Use Topics  . Alcohol use: No  . Drug use: No     Allergies   Codeine, Septra ds [sulfamethoxazole-trimethoprim], and Tramadol   Review of Systems Review of Systems  Gastrointestinal: Positive for nausea.  Genitourinary: Positive for vaginal discharge.  Neurological: Positive for headaches.   Physical Exam Triage Vital Signs ED Triage Vitals  Enc Vitals Group     BP 05/30/20 1025 (!) 158/85     Pulse Rate 05/30/20 1025 64     Resp 05/30/20 1025 18     Temp 05/30/20 1025 98.1 F (36.7 C)     Temp Source 05/30/20 1025 Oral     SpO2 05/30/20 1025 99 %     Weight 05/30/20 1028 230 lb (104.3 kg)     Height 05/30/20 1028 4\' 11"  (1.499 m)     Head Circumference --      Peak Flow --      Pain Score 05/30/20 1025 0     Pain Loc --  Pain Edu? --      Excl. in [place]? --    Updated Vital Signs BP (!) 158/85 (BP Location: Left Arm)   Pulse 64   Temp 98.1 F (36.7 C) (Oral)   Resp 18   Ht 4\' 11"  (1.499 m)   Wt 104.3 kg   SpO2 99%   BMI 46.45 kg/m   Visual Acuity Right Eye Distance:   Left Eye Distance:   Bilateral Distance:    Right Eye Near:   Left Eye Near:    Bilateral Near:     Physical Exam Constitutional:      General: She is not in acute distress.    Appearance: Normal appearance. She is obese. She is not ill-appearing.  HENT:     Head: Normocephalic and atraumatic.  Eyes:     General:        Right eye: No discharge.        Left eye: No discharge.     Conjunctiva/sclera: Conjunctivae normal.  Cardiovascular:     Rate and Rhythm: Normal rate and regular rhythm.     Heart sounds: No murmur.  Pulmonary:     Effort: Pulmonary effort is normal.     Breath sounds: Normal  breath sounds. No wheezing, rhonchi or rales.  Abdominal:     General: There is no distension.     Palpations: Abdomen is soft.     Tenderness: There is no abdominal tenderness.  Neurological:     Mental Status: She is alert.  Psychiatric:        Mood and Affect: Mood normal.        Behavior: Behavior normal.    UC Treatments / Results  Labs (all labs ordered are listed, but only abnormal results are displayed) Labs Reviewed  WET PREP, GENITAL - Abnormal; Notable for the following components:      Result Value   Yeast Wet Prep HPF POC PRESENT (*)    WBC, Wet Prep HPF POC FEW (*)    All other components within normal limits  CBC WITH DIFFERENTIAL/PLATELET - Abnormal; Notable for the following components:   Hemoglobin 15.1 (*)    All other components within normal limits  COMPREHENSIVE METABOLIC PANEL - Abnormal; Notable for the following components:   AST 446 (*)    ALT 432 (*)    Alkaline Phosphatase 161 (*)    All other components within normal limits  LIPASE, BLOOD  GAMMA GT    EKG   Radiology No results found.  Procedures Procedures (including critical care time)  Medications Ordered in UC Medications  ondansetron (ZOFRAN-ODT) disintegrating tablet 8 mg (8 mg Oral Given 05/30/20 1103)    Initial Impression / Assessment and Plan / UC Course  I have reviewed the triage vital signs and the nursing notes.  Pertinent labs & imaging results that were available during my care of the patient were reviewed by me and considered in my medical decision making (see chart for details).    64 year old female presents with ongoing nausea, headaches, and vaginal discharge.  Wet prep revealed yeast.  Treating with Diflucan.  Given ongoing nausea, laboratory studies obtained and revealed markedly elevated liver enzymes (AST 446, ALT 432).  Alkaline phosphatase also elevated today at 161.  Awaiting GGT.  Zofran as needed for nausea.  Referring to GI.  Final Clinical Impressions(s)  / UC Diagnoses   Final diagnoses:  Yeast vaginitis  Elevated LFTs  Elevated alkaline phosphatase level     Discharge Instructions  I will place a referral to Dr. Servando Snare.  Medication as prescribed.  Take care  Dr. Adriana Simas     ED Prescriptions    Medication Sig Dispense Auth. Provider   ondansetron (ZOFRAN) 4 MG tablet Take 1 tablet (4 mg total) by mouth every 8 (eight) hours as needed for nausea or vomiting. 20 tablet Corretta Munce G, DO   fluconazole (DIFLUCAN) 150 MG tablet Take 1 tablet (150 mg total) by mouth once for 1 dose. Repeat dose in 72 hours. 2 tablet Tommie Sams, DO     PDMP not reviewed this encounter.   Tommie Sams, DO 05/30/20 1319    Everlene Other G, DO 05/30/20 1337

## 2020-05-30 NOTE — Discharge Instructions (Signed)
I will place a referral to Dr. Servando Snare.  Medication as prescribed.  Take care  Dr. Adriana Simas

## 2020-08-01 ENCOUNTER — Ambulatory Visit
Admission: EM | Admit: 2020-08-01 | Discharge: 2020-08-01 | Disposition: A | Discharge status: Home / Self Care | Payer: 59 | Attending: Family Medicine | Admitting: Family Medicine

## 2020-08-01 ENCOUNTER — Ambulatory Visit (INDEPENDENT_AMBULATORY_CARE_PROVIDER_SITE_OTHER): Payer: 59

## 2020-08-01 ENCOUNTER — Other Ambulatory Visit: Payer: Self-pay

## 2020-08-01 DIAGNOSIS — M7918 Myalgia, other site: Secondary | ICD-10-CM

## 2020-08-01 IMAGING — CR DG HAND COMPLETE 3+V*R*
3 series · 3 of 3 positions shown · non-contrast
Comparison: None.

CLINICAL DATA: Right hand pain after an injury when the patient
slipped yesterday. Initial encounter.

EXAM:
RIGHT HAND - COMPLETE 3+ VIEW

[hand ap]
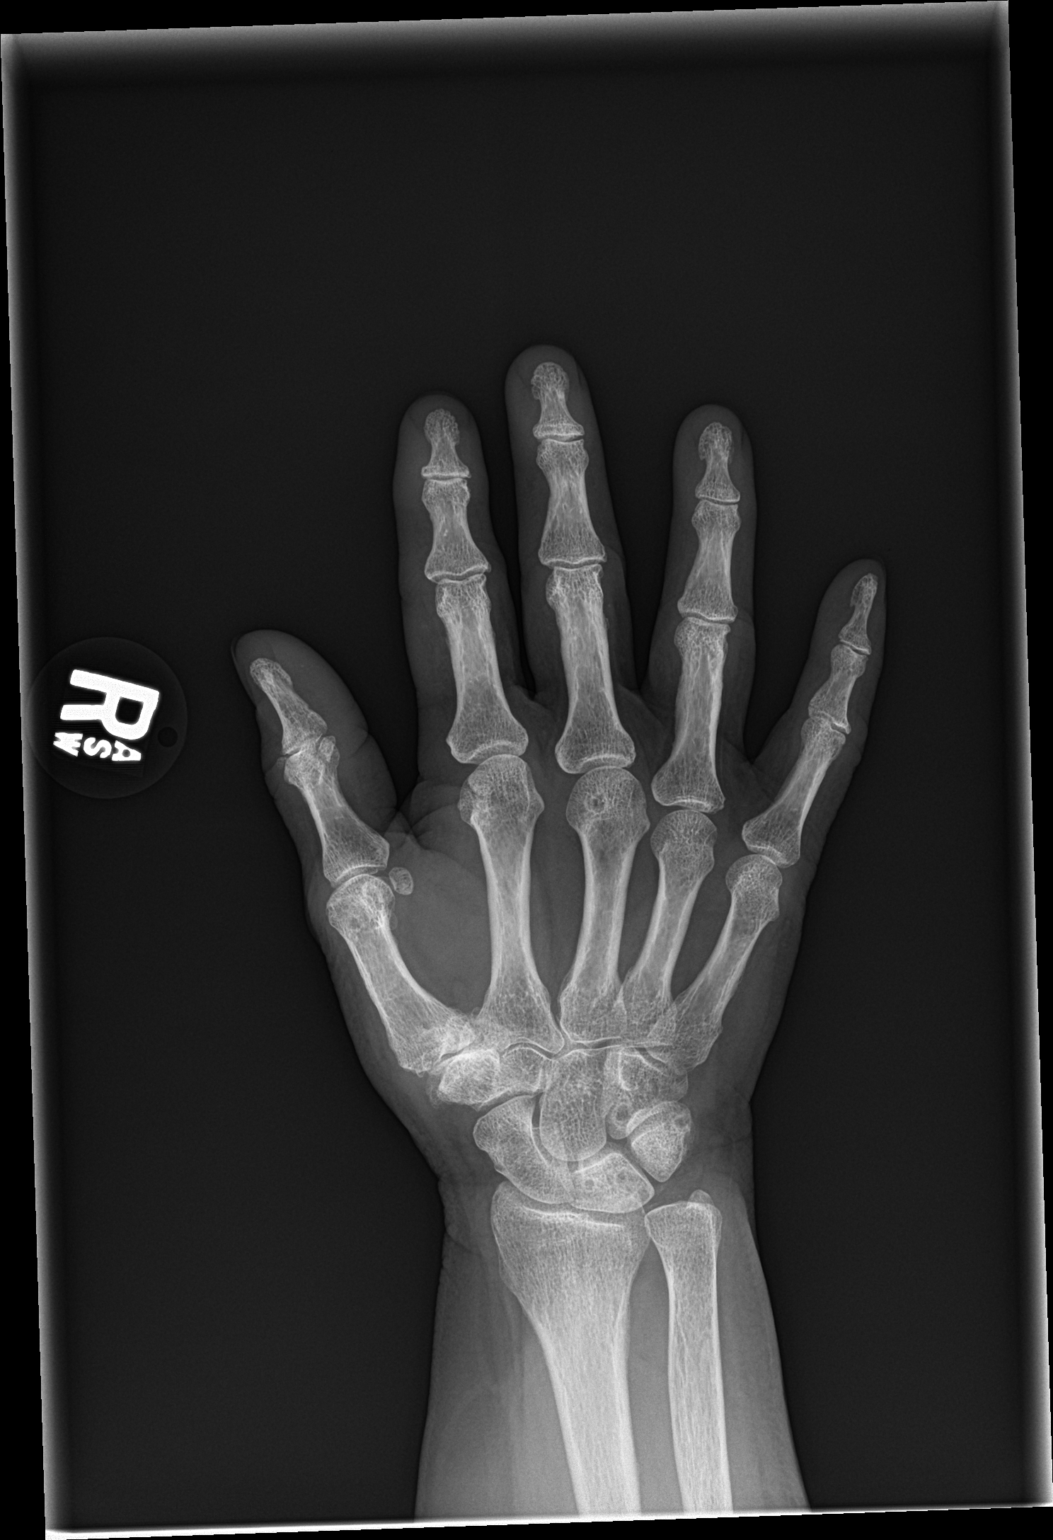

[hand obl]
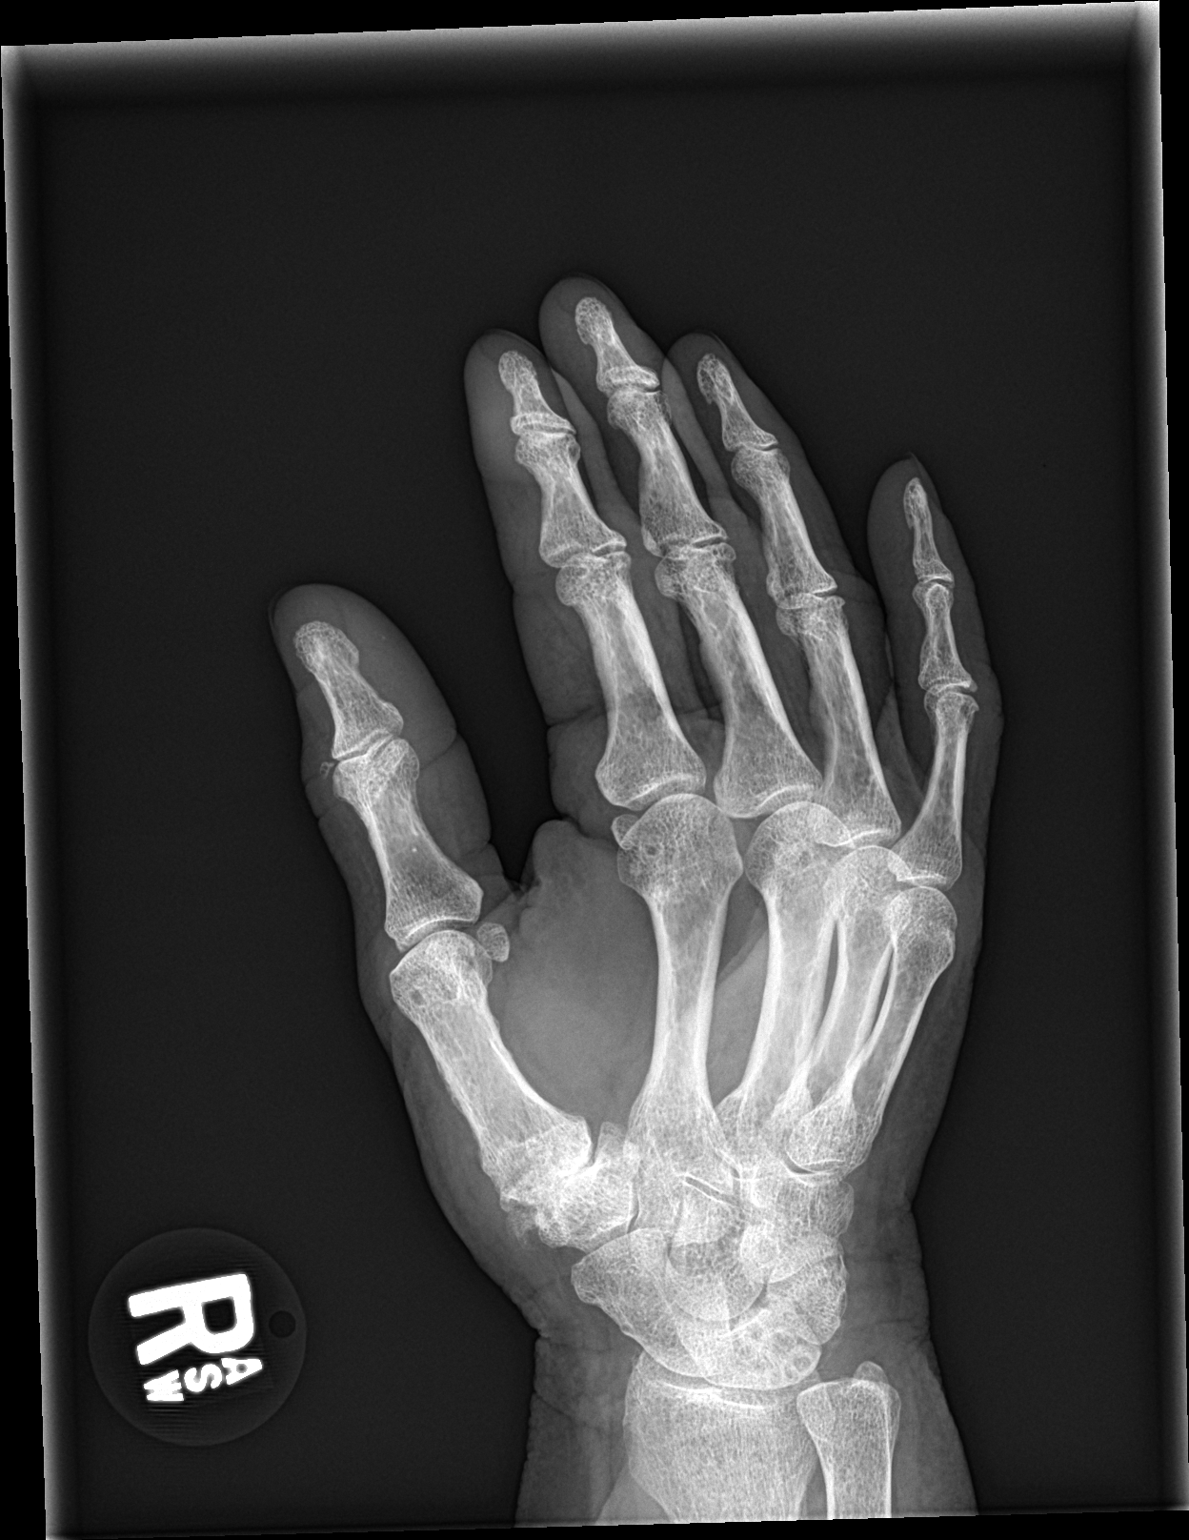

[hand lat]
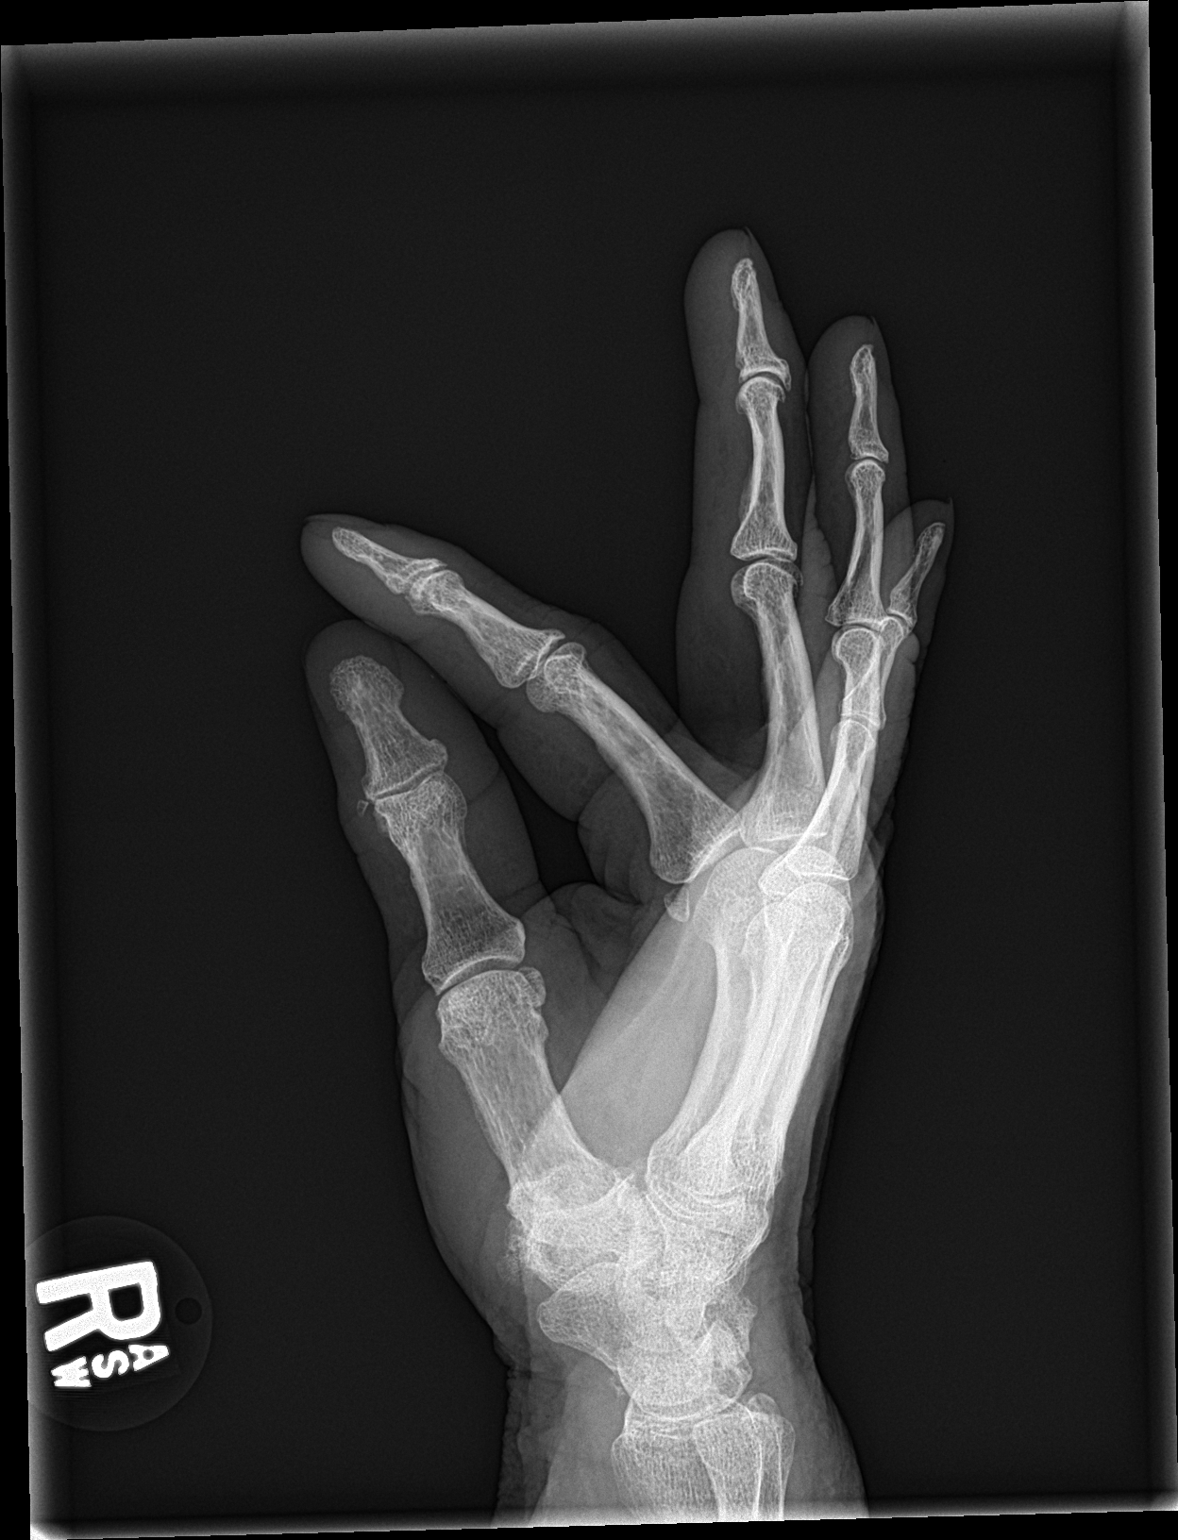

[3 of 3 positions shown; findings below may reference images not displayed]

FINDINGS: No acute bony or joint abnormality is identified. The patient has
scattered interphalangeal joint osteoarthritis and first CMC
osteoarthritis. Soft tissues are unremarkable.
IMPRESSION: No acute abnormality.

Multifocal osteoarthritis appears worst at the first CMC joint.

## 2020-08-01 IMAGING — CR DG WRIST COMPLETE 3+V*R*
4 series · 4 of 4 positions shown · non-contrast
Comparison: None.

CLINICAL DATA: Right wrist pain since an injury when the patient
slipped yesterday. Initial encounter.

EXAM:
RIGHT WRIST - COMPLETE 3+ VIEW

[wrist pa]
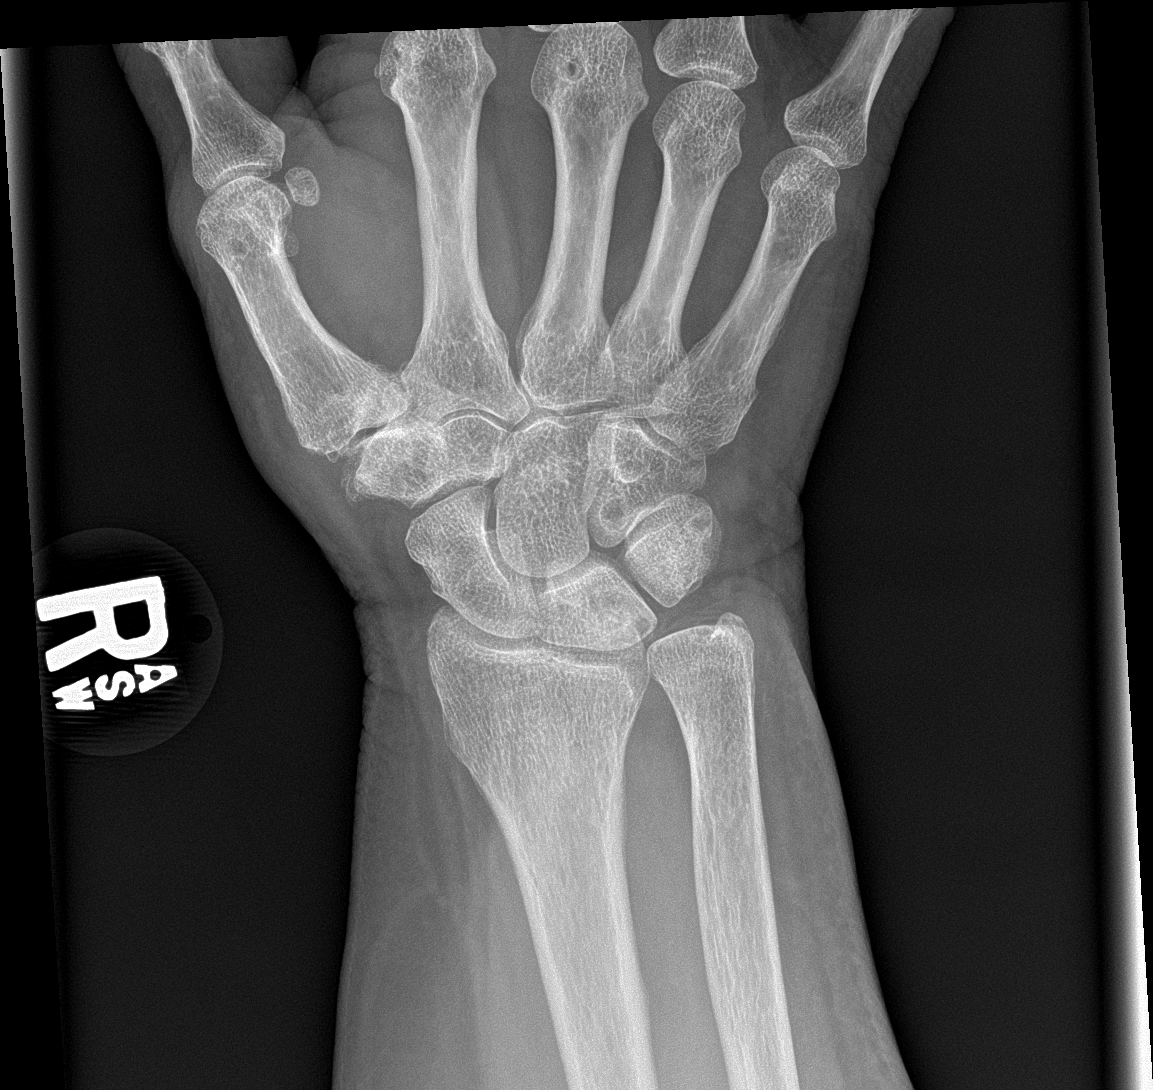

[wrist obl]
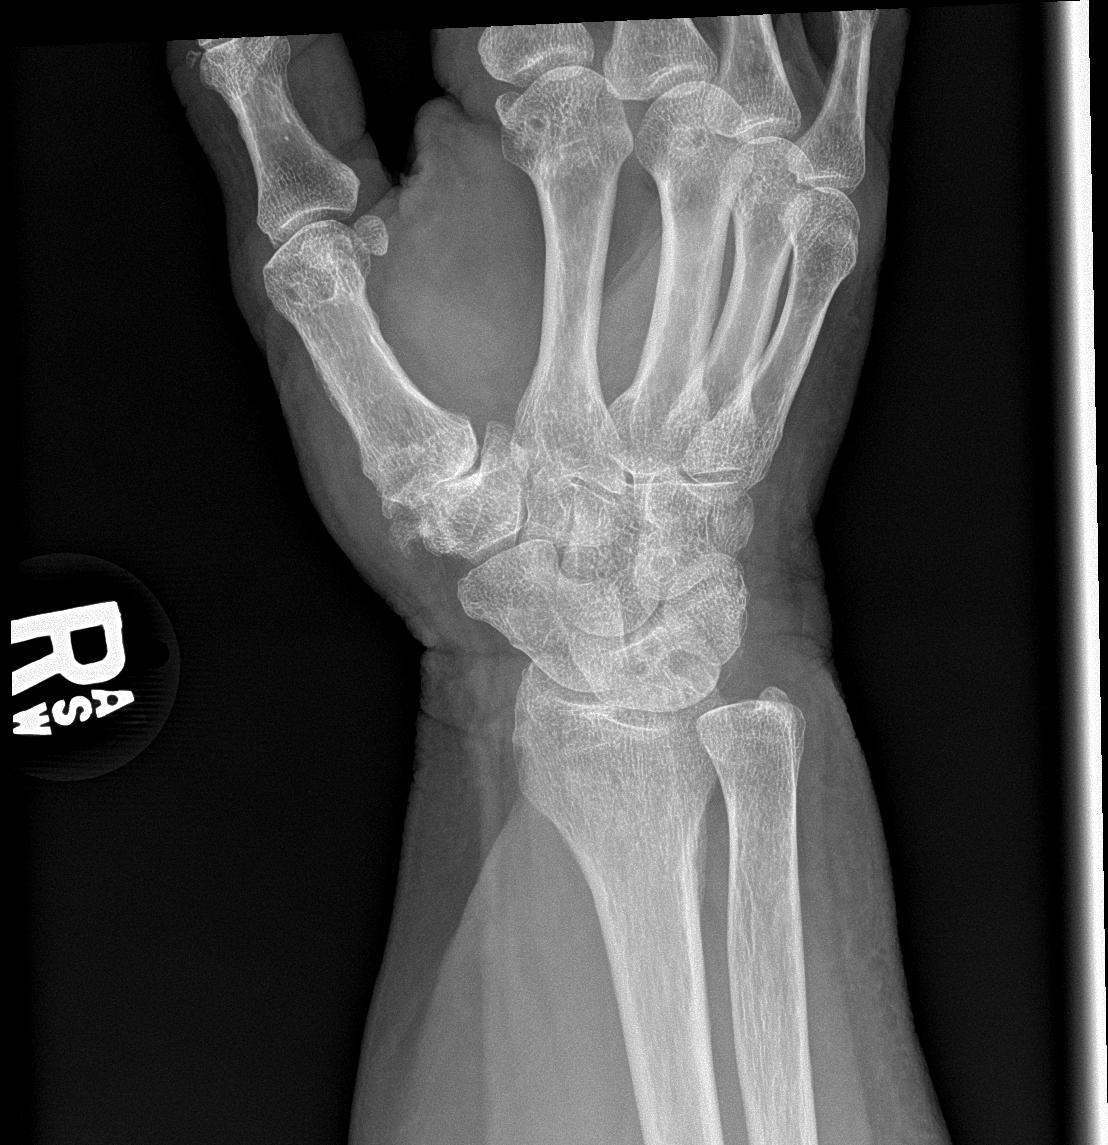

[wrist lat]
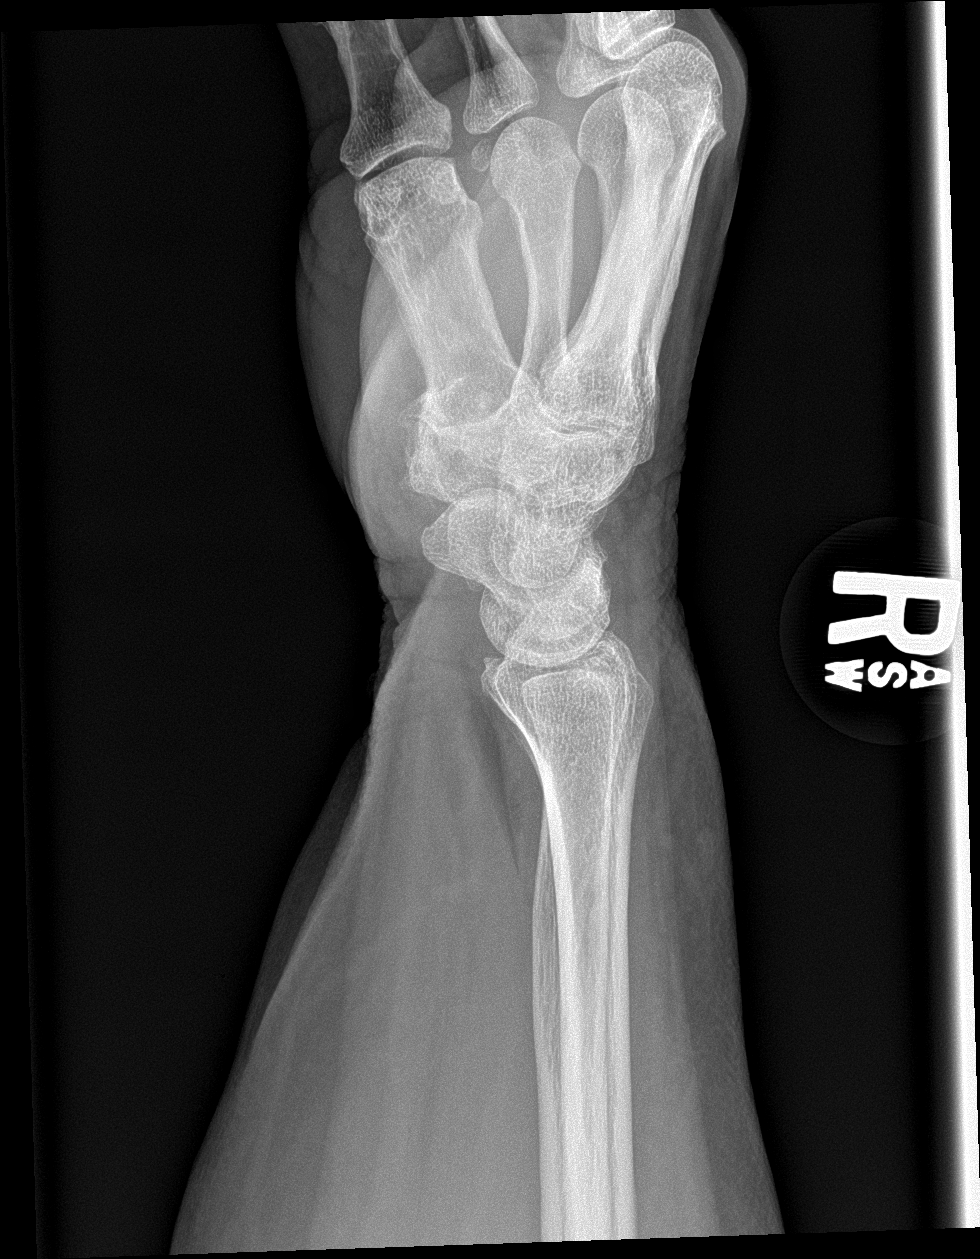

[wrist navicular]
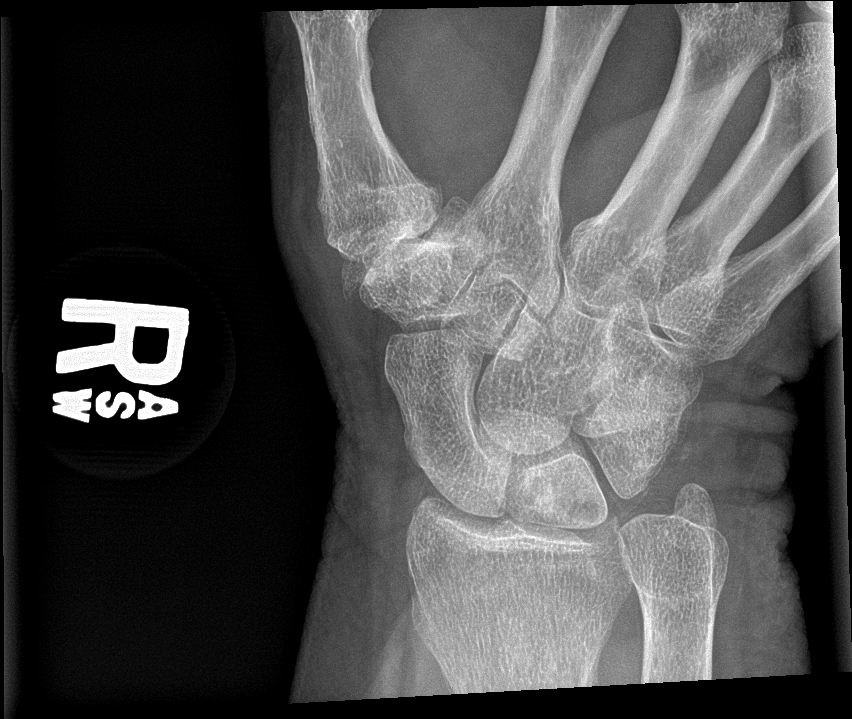

[4 of 4 positions shown; findings below may reference images not displayed]

FINDINGS: No acute bony or joint abnormality is identified. The patient has
advanced first CMC osteoarthritis. Ulnar positive variance and
cystic change in the ulnar aspect of the lunate are compatible with
ulnocarpal abutment. There is some chondrocalcinosis of the
triangular fibrocartilage.
IMPRESSION: No acute abnormality.

Findings compatible with ulnocarpal abutment.

Advanced first CMC osteoarthritis.

Chondrocalcinosis.

## 2020-08-01 IMAGING — CR DG ELBOW COMPLETE 3+V*R*
4 series · 4 of 4 positions shown · non-contrast
Comparison: None.

CLINICAL DATA: Right elbow pain since an injury when the patient
slipped yesterday. Initial encounter.

EXAM:
RIGHT ELBOW - COMPLETE 3+ VIEW

[elbow ap]
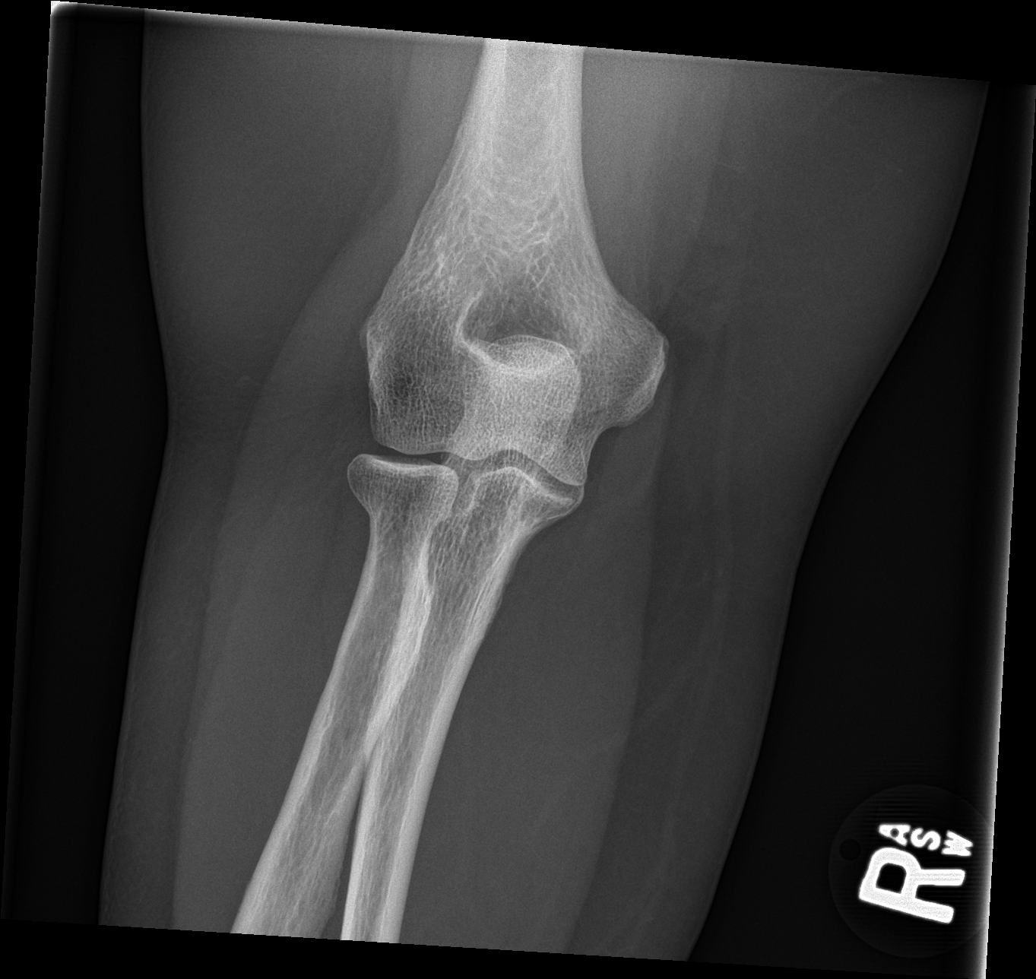

[elbow obl (1 of 2)]
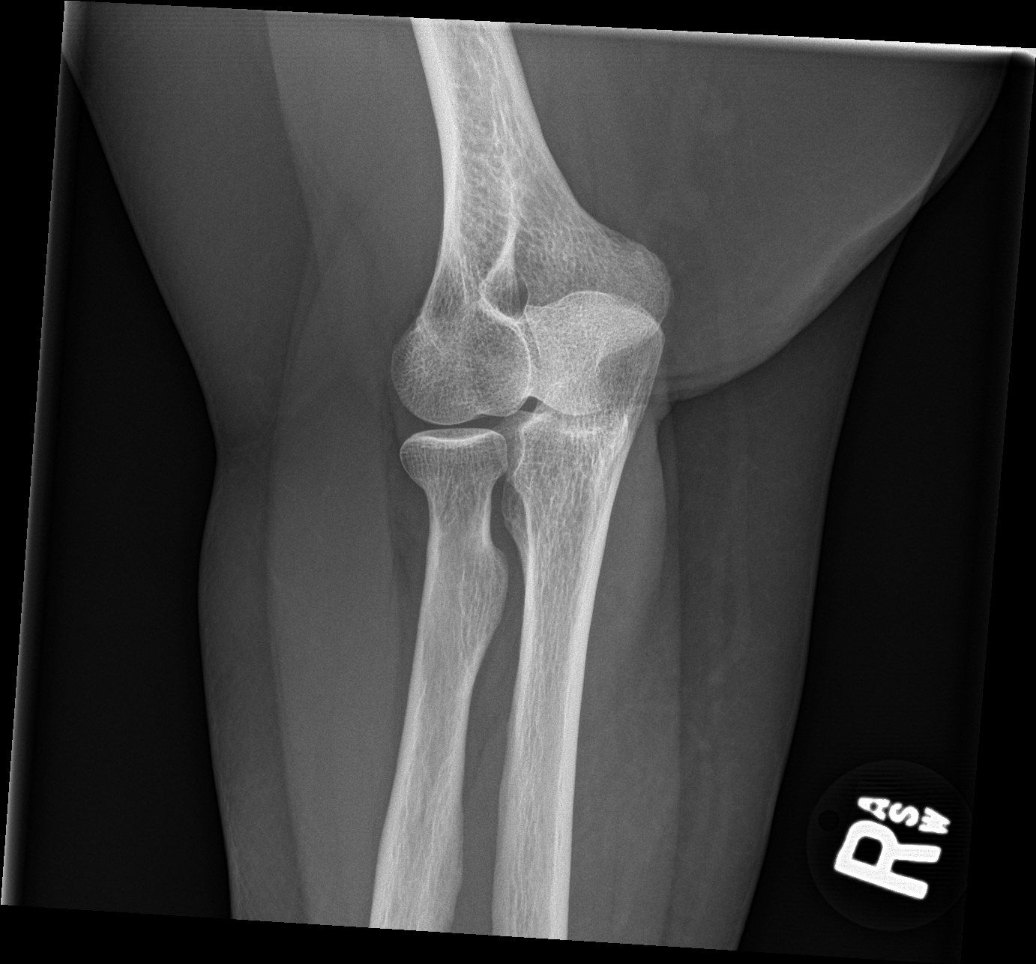

[elbow obl (2 of 2)]
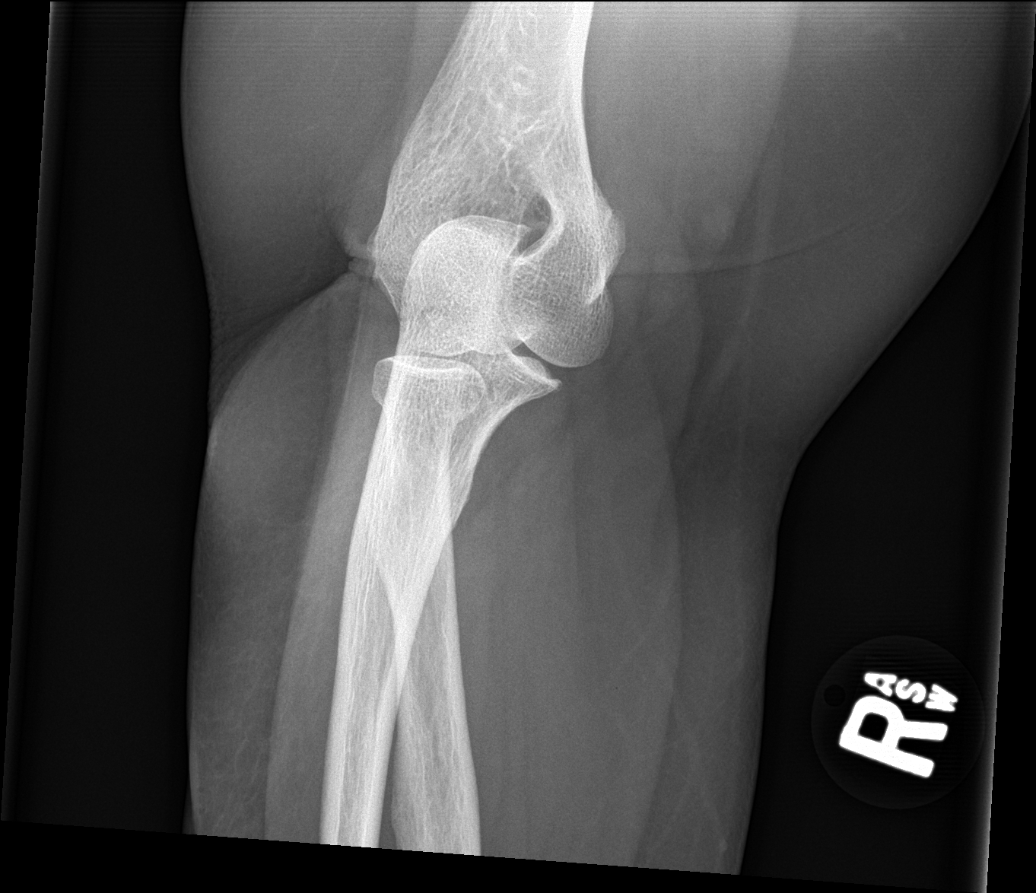

[elbow lat]
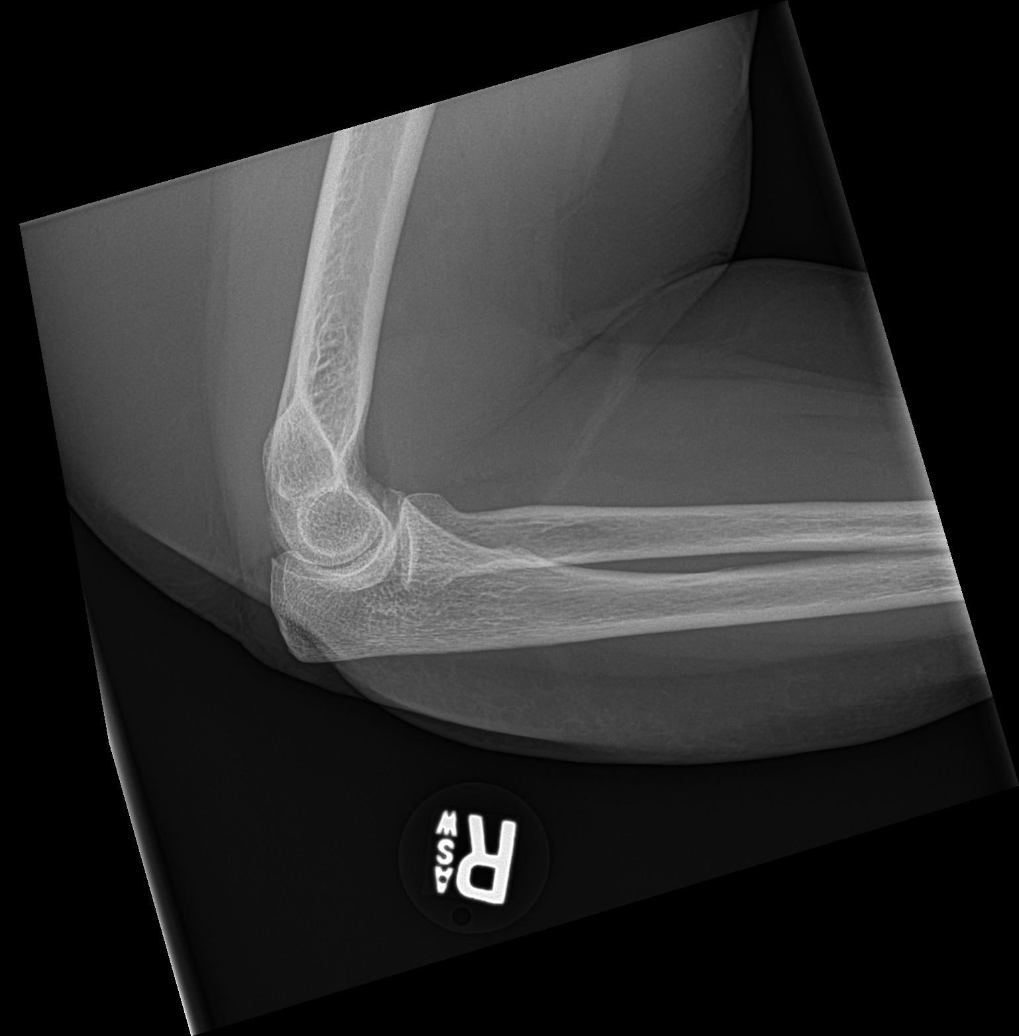

[4 of 4 positions shown; findings below may reference images not displayed]

FINDINGS: There is no evidence of fracture, dislocation, or joint effusion.
There is no evidence of arthropathy or other focal bone abnormality.
Soft tissues are unremarkable.
IMPRESSION: Normal exam.

## 2020-08-01 MED ORDER — NAPROXEN 500 MG PO TABS: 500.0000 mg | ORAL_TABLET | Freq: Two times a day (BID) | ORAL | 0 refills | Status: DC | PRN

## 2020-08-01 NOTE — ED Triage Notes (Signed)
Pt was reaching into her car yesterday and feet went out from under her and she grabbed the seat belt holder and it broke. Pain to right hand, wrist and forearm.

## 2020-08-01 NOTE — ED Provider Notes (Signed)
[facility]    CSN: [id number] Arrival date & time: 08/01/20  1014      History   Chief Complaint Chief Complaint  Patient presents with  . Hand Pain    HPI  64 year old female presents with right hand, wrist, and elbow pain.  Patient reports that she suffered a fall yesterday.  She fell while reaching into her car.  She injured her right arm.  Patient reports some mild swelling.  Mild pain, 2/10 in severity.  Patient is concerned about the possibility of fracture.  No relieving factors.  No other associated symptoms.  No other complaints.  Patient Active Problem List   Diagnosis Date Noted  . Varicose veins of left lower extremity with complications 10/28/2017  . Venous ulcer of left leg (HCC) 02/09/2016    Past Surgical History:  Procedure Laterality Date  . CHOLECYSTECTOMY    . ENDOVENOUS ABLATION SAPHENOUS VEIN W/ LASER Left 11/12/2017   endovenous laser ablation left greater saphenous vein by Josephina Gip MD   . VASCULAR SURGERY      OB History   No obstetric history on file.      Home Medications    Prior to Admission medications   Medication Sig Start Date End Date Taking? Authorizing Provider  naproxen (NAPROSYN) 500 MG tablet Take 1 tablet (500 mg total) by mouth 2 (two) times daily as needed for moderate pain. 08/01/20   Tommie Sams, DO  ondansetron (ZOFRAN) 4 MG tablet Take 1 tablet (4 mg total) by mouth every 8 (eight) hours as needed for nausea or vomiting. 05/30/20   Tommie Sams, DO    Family History Family History  Problem Relation Age of Onset  . Heart disease Mother   . Diabetes Mother   . Cancer Father   . Heart disease Father   . Hypertension Father     Social History Social History   Tobacco Use  . Smoking status: Never Smoker  . Smokeless tobacco: Never Used  Vaping Use  . Vaping Use: Never used  Substance Use Topics  . Alcohol use: No  . Drug use: No     Allergies   Codeine, Septra ds  [sulfamethoxazole-trimethoprim], and Tramadol   Review of Systems Review of Systems Per HPI  Physical Exam Triage Vital Signs ED Triage Vitals  Enc Vitals Group     BP 08/01/20 1031 (!) 174/79     Pulse Rate 08/01/20 1031 (!) 52     Resp 08/01/20 1031 17     Temp 08/01/20 1031 98.2 F (36.8 C)     Temp Source 08/01/20 1031 Oral     SpO2 08/01/20 1031 99 %     Weight 08/01/20 1033 230 lb (104.3 kg)     Height 08/01/20 1033 5' (1.524 m)     Head Circumference --      Peak Flow --      Pain Score 08/01/20 1028 2     Pain Loc --      Pain Edu? --      Excl. in [place]? --    No data found.  Updated Vital Signs BP (!) 174/79 (BP Location: Left Arm)   Pulse (!) 52   Temp 98.2 F (36.8 C) (Oral)   Resp 17   Ht 5' (1.524 m)   Wt 104.3 kg   SpO2 99%   BMI 44.92 kg/m   Visual Acuity Right Eye Distance:   Left Eye Distance:   Bilateral Distance:  Right Eye Near:   Left Eye Near:    Bilateral Near:     Physical Exam Constitutional:      General: She is not in acute distress.    Appearance: Normal appearance. She is obese. She is not ill-appearing.  HENT:     Head: Normocephalic and atraumatic.  Eyes:     General:        Right eye: No discharge.        Left eye: No discharge.     Conjunctiva/sclera: Conjunctivae normal.  Cardiovascular:     Rate and Rhythm: Normal rate and regular rhythm.  Pulmonary:     Effort: Pulmonary effort is normal.     Breath sounds: Normal breath sounds.  Musculoskeletal:     Comments: Right hand, wrist, elbow -evidence of arthritis noted of the right hand.  No discrete areas of tenderness.  Normal range of motion.  Neurological:     Mental Status: She is alert.  Psychiatric:        Mood and Affect: Mood normal.        Behavior: Behavior normal.    UC Treatments / Results  Labs (all labs ordered are listed, but only abnormal results are displayed) Labs Reviewed - No data to display  EKG   Radiology DG Elbow Complete  Right  Result Date: 08/01/2020 CLINICAL DATA:  Right elbow pain since an injury when the patient slipped yesterday. Initial encounter. EXAM: RIGHT ELBOW - COMPLETE 3+ VIEW COMPARISON:  None. FINDINGS: There is no evidence of fracture, dislocation, or joint effusion. There is no evidence of arthropathy or other focal bone abnormality. Soft tissues are unremarkable. IMPRESSION: Normal exam. Electronically Signed   By: Drusilla Kanner M.D.   On: 08/01/2020 11:25   DG Wrist Complete Right  Result Date: 08/01/2020 CLINICAL DATA:  Right wrist pain since an injury when the patient slipped yesterday. Initial encounter. EXAM: RIGHT WRIST - COMPLETE 3+ VIEW COMPARISON:  None. FINDINGS: No acute bony or joint abnormality is identified. The patient has advanced first CMC osteoarthritis. Ulnar positive variance and cystic change in the ulnar aspect of the lunate are compatible with ulnocarpal abutment. There is some chondrocalcinosis of the triangular fibrocartilage. IMPRESSION: No acute abnormality. 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 Electronically Signed   By: Drusilla Kanner M.D.   On: 08/01/2020 11:25   DG Hand Complete Right  Result Date: 08/01/2020 CLINICAL DATA:  Right hand pain after an injury when the patient slipped yesterday. Initial encounter. EXAM: RIGHT HAND - COMPLETE 3+ VIEW COMPARISON:  None. FINDINGS: No acute bony or joint abnormality is identified. The patient has scattered interphalangeal joint osteoarthritis and first CMC osteoarthritis. Soft tissues are unremarkable. IMPRESSION: No acute abnormality. Multifocal osteoarthritis appears worst at the first [facility] joint. Electronically Signed   By: Drusilla Kanner M.D.   On: 08/01/2020 11:24    Procedures Procedures (including critical care time)  Medications Ordered in UC Medications - No data to display  Initial Impression / Assessment and Plan / UC Course  I have reviewed the  triage vital signs and the nursing notes.  Pertinent labs & imaging results that were available during my care of the patient were reviewed by me and considered in my medical decision making (see chart for details).    64 year old female presents with musculoskeletal pain after suffering a fall.  X-rays obtained today of the wrist, hand, and elbow.  X-rays interpreted independently by me.  X-ray of the hand  revealed osteoarthritis of the IP joints as well as the [facility] joint.  No fracture.  X-ray of the wrist revealed CMC arthritis.  No acute fracture.  X-ray of the elbow was normal.  Naproxen as directed.  Rest, ice.  Supportive care.  Final Clinical Impressions(s) / UC Diagnoses   Final diagnoses:  Musculoskeletal pain     Discharge Instructions     No fracture.  Rest, ice.  Medication as needed.  Take care  Dr. Adriana Simas    ED Prescriptions    Medication Sig Dispense Auth. Provider   naproxen (NAPROSYN) 500 MG tablet Take 1 tablet (500 mg total) by mouth 2 (two) times daily as needed for moderate pain. 30 tablet Tommie Sams, DO     PDMP not reviewed this encounter.   Tommie Sams, [place] 08/01/20 1159

## 2020-08-01 NOTE — Discharge Instructions (Signed)
No fracture.  Rest, ice.  Medication as needed.  Take care  Dr. Adriana Simas

## 2020-09-08 ENCOUNTER — Other Ambulatory Visit: Payer: Self-pay

## 2020-09-08 ENCOUNTER — Ambulatory Visit
Admission: EM | Admit: 2020-09-08 | Discharge: 2020-09-08 | Disposition: A | Discharge status: Home / Self Care | Payer: 59 | Attending: Family Medicine | Admitting: Family Medicine

## 2020-09-08 DIAGNOSIS — Z20822 Contact with and (suspected) exposure to covid-19: Secondary | ICD-10-CM | POA: Insufficient documentation

## 2020-09-08 DIAGNOSIS — R0981 Nasal congestion: Secondary | ICD-10-CM

## 2020-09-08 DIAGNOSIS — Z888 Allergy status to other drugs, medicaments and biological substances status: Secondary | ICD-10-CM | POA: Diagnosis not present

## 2020-09-08 DIAGNOSIS — Z885 Allergy status to narcotic agent status: Secondary | ICD-10-CM | POA: Diagnosis not present

## 2020-09-08 DIAGNOSIS — R519 Headache, unspecified: Secondary | ICD-10-CM | POA: Diagnosis not present

## 2020-09-08 DIAGNOSIS — J302 Other seasonal allergic rhinitis: Secondary | ICD-10-CM | POA: Insufficient documentation

## 2020-09-08 DIAGNOSIS — Z881 Allergy status to other antibiotic agents status: Secondary | ICD-10-CM | POA: Insufficient documentation

## 2020-09-08 LAB — SARS CORONAVIRUS 2 (TAT 6-24 HRS): SARS Coronavirus 2: NEGATIVE

## 2020-09-08 NOTE — ED Triage Notes (Signed)
Pt with 5-6 days of nasal congestion and headache.

## 2020-09-08 NOTE — ED Provider Notes (Addendum)
[facility]    CSN: [id number] Arrival date & time: 09/08/20  0813      History   Chief Complaint Chief Complaint  Patient presents with  . Nasal Congestion  . Headache    HPI [place] Hashman is a 64 y.o. female.   MS. Champine is a 64 yo female who is here for evaluation of nasal congestion and HA. She has had congestion for a week and a frontal headache that developed today. She wakes up every morning with a stuffy nose and crusty discharge. Sometimes she wakes up in the night with same. She states that she has Fall and Spring allergies and that her symptoms typically start around this time. Her symptoms typically include a headache which she takes Tylenol for. She does not take any allergy medication. Her HA waxes and wanes and she has not taken anything for her symptoms today. Her family suggested she come to UC for  COVID test. She has not had a fever, ear pressure, continuous nasal discharge, ST, changes to her sense of taste or smell,  cough, shortness of breath, chest pain, or N/V/D.   The history is provided by the patient.  Headache Pain location:  Frontal Quality:  Dull Associated symptoms: congestion and hearing loss   Associated symptoms: no abdominal pain, no back pain, no cough, no diarrhea, no drainage, no ear pain, no fatigue, no fever, no nausea, no neck pain, no sore throat and no vomiting     Past Medical History:  Diagnosis Date  . Injury, blood vessel     Patient Active Problem List   Diagnosis Date Noted  . Varicose veins of left lower extremity with complications 56/38/9373  . Venous ulcer of left leg ([place]) 02/09/2016    Past Surgical History:  Procedure Laterality Date  . CHOLECYSTECTOMY    . ENDOVENOUS ABLATION SAPHENOUS VEIN W/ LASER Left 11/12/2017   endovenous laser ablation left greater saphenous vein by Tinnie Gens MD   . VASCULAR SURGERY      OB History   No obstetric history on file.      Home Medications     Prior to Admission medications   Medication Sig Start Date End Date Taking? Authorizing Provider  naproxen (NAPROSYN) 500 MG tablet Take 1 tablet (500 mg total) by mouth 2 (two) times daily as needed for moderate pain. 08/01/20   Coral Spikes, DO  ondansetron (ZOFRAN) 4 MG tablet Take 1 tablet (4 mg total) by mouth every 8 (eight) hours as needed for nausea or vomiting. 05/30/20   Coral Spikes, DO    Family History Family History  Problem Relation Age of Onset  . Heart disease Mother   . Diabetes Mother   . Cancer Father   . Heart disease Father   . Hypertension Father     Social History Social History   Tobacco Use  . Smoking status: Never Smoker  . Smokeless tobacco: Never Used  Vaping Use  . Vaping Use: Never used  Substance Use Topics  . Alcohol use: No  . Drug use: No     Allergies   Codeine, Septra ds [sulfamethoxazole-trimethoprim], and Tramadol   Review of Systems Review of Systems  Constitutional: Negative for activity change, appetite change, chills, diaphoresis, fatigue, fever and unexpected weight change.  HENT: Positive for congestion, hearing loss and sinus pain. Negative for ear discharge, ear pain, facial swelling, nosebleeds, postnasal drip, rhinorrhea and sore throat.  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 Frontal sinus pressure.   Eyes: Negative for discharge.  Respiratory: Negative for apnea, cough and shortness of breath.   Cardiovascular: Negative for chest pain and palpitations.  Gastrointestinal: Negative for abdominal pain, constipation, diarrhea, nausea and vomiting.  Genitourinary: Negative for dysuria, hematuria and urgency.  Musculoskeletal: Negative for back pain and neck pain.  Allergic/Immunologic: Positive for environmental allergies.  Neurological: Positive for headaches.  Psychiatric/Behavioral: Negative for behavioral problems and confusion.     Physical Exam Triage Vital Signs ED Triage Vitals   Enc Vitals Group     BP 09/08/20 0846 (!) 156/86     Pulse Rate 09/08/20 0846 (!) 58     Resp 09/08/20 0846 19     Temp 09/08/20 0846 98.6 F (37 C)     Temp Source 09/08/20 0846 Oral     SpO2 09/08/20 0846 100 %     Weight 09/08/20 0845 229 lb 4.5 oz (104 kg)     Height 09/08/20 0845 5' (1.524 m)     Head Circumference --      Peak Flow --      Pain Score 09/08/20 0845 2     Pain Loc --      Pain Edu? --      Excl. in [place]? --    No data found.  Updated Vital Signs BP (!) 156/86 (BP Location: Left Arm)   Pulse (!) 58   Temp 98.6 F (37 C) (Oral)   Resp 19   Ht 5' (1.524 m)   Wt 229 lb 4.5 oz (104 kg)   SpO2 100%   BMI 44.78 kg/m   Visual Acuity Right Eye Distance:   Left Eye Distance:   Bilateral Distance:    Right Eye Near:   Left Eye Near:    Bilateral Near:     Physical Exam Constitutional:      General: She is not in acute distress.    Appearance: She is well-developed. She is not ill-appearing, toxic-appearing or diaphoretic.  HENT:     Head: Normocephalic and atraumatic.     Right Ear: Tympanic membrane, ear canal and external ear normal. Decreased hearing noted.     Left Ear: Tympanic membrane and ear canal normal.     Nose: Nose normal. No septal deviation.     Right Nostril: No epistaxis or occlusion.     Left Nostril: No epistaxis.     Right Turbinates: Swollen and pale.     Left Turbinates: Swollen and pale.     Right Sinus: No maxillary sinus tenderness or frontal sinus tenderness.     Left Sinus: No maxillary sinus tenderness.     Mouth/Throat:     Lips: Pink.     Mouth: Mucous membranes are moist.     Dentition: Abnormal dentition.     Tongue: No lesions. Tongue does not deviate from midline.     Palate: No mass and lesions.     Pharynx: Oropharynx is clear. Uvula midline.     Tonsils: No tonsillar exudate.  Eyes:     Extraocular Movements: Extraocular movements intact.  Neck:     Thyroid: No thyroid mass, thyromegaly or thyroid  tenderness.     Trachea: Trachea and phonation normal.  Cardiovascular:     Rate and Rhythm: Normal rate and regular rhythm.     Pulses: Normal pulses.     Heart sounds: Normal heart sounds.  Pulmonary:  Effort: Pulmonary effort is normal.     Breath sounds: Normal breath sounds.  Musculoskeletal:        General: Normal range of motion.     Cervical back: Normal range of motion and neck supple.     Right lower leg: No edema.     Left lower leg: No edema.  Lymphadenopathy:     Cervical: No cervical adenopathy.  Skin:    General: Skin is warm and dry.  Neurological:     Mental Status: She is alert and oriented to person, place, and time.     Cranial Nerves: Cranial nerves are intact.     Sensory: Sensation is intact.     Motor: Motor function is intact.     Coordination: Coordination is intact.     Gait: Gait is intact.  Psychiatric:        Attention and Perception: Attention and perception normal.        Mood and Affect: Mood normal.        Speech: Speech normal.        Behavior: Behavior normal. Behavior is cooperative.        Thought Content: Thought content normal.        Cognition and Memory: Cognition normal.        Judgment: Judgment normal.      UC Treatments / Results  Labs (all labs ordered are listed, but only abnormal results are displayed) Labs Reviewed  SARS CORONAVIRUS 2 (TAT 6-24 HRS)    EKG   Radiology No results found.  Procedures Procedures (including critical care time)  Medications Ordered in UC Medications - No data to display  Initial Impression / Assessment and Plan / UC Course  I have reviewed the triage vital signs and the nursing notes.  Pertinent labs & imaging results that were available during my care of the patient were reviewed by me and considered in my medical decision making (see chart for details).   Patient being evaluated for headache and nasal congestion.  Consider COVID- will swab for same.  Treat  symptomatically as appropriate.    Final Clinical Impressions(s) / UC Diagnoses   Final diagnoses:  Nasal congestion  Seasonal allergic rhinitis, unspecified trigger     Discharge Instructions     Drink plenty of fluids to help keep your mucous thin- avoid dairy products as this will make your mucous thicker.  Use a Neilmed sinus rinse kit twice daily- you can get these at any pharmacy or grocery store. Fill the bottle with distilled water, DO NOT USE TAP WATER, and add the salt packet provided.  Hold it up to your nostril, keep your mouth open, and squeeze the bottle. Empty the bottle up one side and then fill it up again and wash out the other nostril.  Heat sterilize the bottle before use by separating the two pieces and placing them on a pier plate. Put them in the microwave for 90 seconds then allow it to cool.  You can also use OTC Allegra 180 mg daily for control of your allergy symptoms.  We will call you when the COVID swab comes back.  If you have further issues follow up with your PCP.       ED Prescriptions    None     PDMP not reviewed this encounter.   Margarette Canada, NP 09/08/20 5361    Margarette Canada, NP 09/08/20 1514

## 2020-09-08 NOTE — Discharge Instructions (Signed)
Drink plenty of fluids to help keep your mucous thin- avoid dairy products as this will make your mucous thicker. ° °Use a Neilmed sinus rinse kit twice daily- you can get these at any pharmacy or grocery store. Fill the bottle with distilled water, DO NOT USE TAP WATER, and add the salt packet provided.  °Hold it up to your nostril, keep your mouth open, and squeeze the bottle. Empty the bottle up one side and then fill it up again and wash out the other nostril.  °Heat sterilize the bottle before use by separating the two pieces and placing them on a pier plate. Put them in the microwave for 90 seconds then allow it to cool. ° °You can also use OTC Allegra 180 mg daily for control of your allergy symptoms. ° °We will call you when the COVID swab comes back. ° °If you have further issues follow up with your PCP.    °

## 2021-01-31 ENCOUNTER — Ambulatory Visit
Admission: EM | Admit: 2021-01-31 | Discharge: 2021-01-31 | Disposition: A | Discharge status: Home / Self Care | Payer: 59

## 2021-01-31 ENCOUNTER — Encounter: Payer: Self-pay | Admitting: Emergency Medicine

## 2021-01-31 ENCOUNTER — Other Ambulatory Visit: Payer: Self-pay

## 2021-01-31 DIAGNOSIS — B070 Plantar wart: Secondary | ICD-10-CM | POA: Diagnosis not present

## 2021-01-31 NOTE — ED Provider Notes (Signed)
[facility]    CSN: [id number] Arrival date & time: 01/31/21  0949      History   Chief Complaint Chief Complaint  Patient presents with  . Foot Pain    Left foot pain    HPI [name] is a 65 y.o. female.   HPI   65 year old female here for evaluation of left foot pain x2 years.  Patient is here because she has had 2 growths on the bottom of her left foot.  She has 1 on the ball of her left foot and then one on the side of her foot over the 5th toe.  The use has been growing for the past 2 years and are impeding her ability to bear weight secondary to pain.  Past Medical History:  Diagnosis Date  . Injury, blood vessel     Patient Active Problem List   Diagnosis Date Noted  . Varicose veins of left lower extremity with complications 10/28/2017  . Venous ulcer of left leg ([facility]) 02/09/2016    Past Surgical History:  Procedure Laterality Date  . CHOLECYSTECTOMY    . ENDOVENOUS ABLATION SAPHENOUS VEIN W/ LASER Left 11/12/2017   endovenous laser ablation left greater saphenous vein by Josephina Gip MD   . VASCULAR SURGERY      OB History   No obstetric history on file.      Home Medications    Prior to Admission medications   Medication Sig Start Date End Date Taking? Authorizing Provider  naproxen (NAPROSYN) 500 MG tablet Take 1 tablet (500 mg total) by mouth 2 (two) times daily as needed for moderate pain. 08/01/20   Tommie Sams, DO  ondansetron (ZOFRAN) 4 MG tablet Take 1 tablet (4 mg total) by mouth every 8 (eight) hours as needed for nausea or vomiting. 05/30/20   Tommie Sams, DO    Family History Family History  Problem Relation Age of Onset  . Heart disease Mother   . Diabetes Mother   . Cancer Father   . Heart disease Father   . Hypertension Father     Social History Social History   Tobacco Use  . Smoking status: Never Smoker  . Smokeless tobacco: Never Used  Vaping Use  . Vaping Use: Never used  Substance Use  Topics  . Alcohol use: No  . Drug use: No     Allergies   Codeine, Septra ds [sulfamethoxazole-trimethoprim], and Tramadol   Review of Systems Review of Systems  Neurological: Negative for weakness and numbness.     Physical Exam Triage Vital Signs ED Triage Vitals [01/31/21 0957]  Enc Vitals Group     BP      Pulse      Resp      Temp      Temp src      SpO2      Weight      Height      Head Circumference      Peak Flow      Pain Score 2     Pain Loc      Pain Edu?      Excl. in [place]?    No data found.  Updated Vital Signs BP (!) 163/77 (BP Location: Left Arm)   Pulse 100   Temp 97.9 F (36.6 C)   Resp 18   Ht 5' (1.524 m)   Wt 250 lb (113.4 kg)   SpO2 100%   BMI 48.82 kg/m  Visual Acuity Right Eye Distance:   Left Eye Distance:   Bilateral Distance:    Right Eye Near:   Left Eye Near:    Bilateral Near:     Physical Exam Vitals and nursing note reviewed.  Constitutional:      Appearance: Normal appearance.  HENT:     Head: Normocephalic.  Skin:    General: Skin is warm and dry.     Capillary Refill: Capillary refill takes less than 2 seconds.     Findings: Lesion present.  Neurological:     General: No focal deficit present.     Mental Status: She is alert and oriented to person, place, and time.  Psychiatric:        Mood and Affect: Mood normal.        Thought Content: Thought content normal.        Judgment: Judgment normal.      UC Treatments / Results  Labs (all labs ordered are listed, but only abnormal results are displayed) Labs Reviewed - No data to display  EKG   Radiology No results found.  Procedures Procedures (including critical care time)  Medications Ordered in UC Medications - No data to display  Initial Impression / Assessment and Plan / UC Course  I have reviewed the triage vital signs and the nursing notes.  Pertinent labs & imaging results that were available during my care of the patient were  reviewed by me and considered in my medical decision making (see chart for details).   Patient is here for evaluation of pain in the left foot this been going for the past 2 years.  She reports that she has had growths that have continued to get bigger or causing pain with walking and weightbearing.  Physical exam reveals a a foot with multiple superficial capillary varicosities.  DP and PT pulses are 2+.  Cap refills approximately 3 seconds.  She has 2 plantar warts protruding approximately 3 mm from the sole of the foot.  The 1st wart is directly in the middle of the ball of the foot behind the great toe and the 2nd 1 is growing laterally and inferiorly from the MTP joint of the 5th toe.  Discussed patient with [facility] pod[facility] and they're able to see her at 215 this afternoon for further management.   Final Clinical Impressions(s) / UC Diagnoses   Final diagnoses:  Plantar warts     Discharge Instructions     Your warts require treatment that we cannot offer in our urgent care.  I have spoken to [facility] and they're able to see you in the podiatry office at 215 this afternoon.    ED Prescriptions    None     PDMP not reviewed this encounter.   Becky [place], NP 01/31/21 1026

## 2021-01-31 NOTE — ED Triage Notes (Signed)
Patient c/o of left foot pain x2 years. Patient states its hard to bear weight on her foot. Patient has multiple growths.

## 2021-01-31 NOTE — Discharge Instructions (Addendum)
Your warts require treatment that we cannot offer in our urgent care.  I have spoken to [facility] and they're able to see you in the podiatry office at 215 this afternoon.

## 2021-03-26 ENCOUNTER — Other Ambulatory Visit: Payer: Self-pay

## 2021-03-26 ENCOUNTER — Encounter: Payer: Self-pay | Admitting: Emergency Medicine

## 2021-03-26 ENCOUNTER — Ambulatory Visit
Admission: EM | Admit: 2021-03-26 | Discharge: 2021-03-26 | Disposition: A | Discharge status: Home / Self Care | Payer: 59 | Attending: Physician Assistant | Admitting: Physician Assistant

## 2021-03-26 DIAGNOSIS — S39012A Strain of muscle, fascia and tendon of lower back, initial encounter: Secondary | ICD-10-CM | POA: Insufficient documentation

## 2021-03-26 DIAGNOSIS — B379 Candidiasis, unspecified: Secondary | ICD-10-CM | POA: Diagnosis not present

## 2021-03-26 DIAGNOSIS — M545 Low back pain, unspecified: Secondary | ICD-10-CM | POA: Insufficient documentation

## 2021-03-26 DIAGNOSIS — R8290 Unspecified abnormal findings in urine: Secondary | ICD-10-CM | POA: Insufficient documentation

## 2021-03-26 LAB — URINALYSIS, COMPLETE (UACMP) WITH MICROSCOPIC
Bilirubin Urine: NEGATIVE
Glucose, UA: NEGATIVE mg/dL
Ketones, ur: NEGATIVE mg/dL
Nitrite: NEGATIVE
Protein, ur: NEGATIVE mg/dL
Specific Gravity, Urine: <1.005 — ABNORMAL LOW (ref 1.005–1.030)
pH: 5.5 (ref 5.0–8.0)

## 2021-03-26 LAB — WET PREP, GENITAL
Clue Cells Wet Prep HPF POC: NONE SEEN
Sperm: NONE SEEN
Trich, Wet Prep: NONE SEEN

## 2021-03-26 MED ORDER — NAPROXEN 500 MG PO TABS: 500.0000 mg | ORAL_TABLET | Freq: Two times a day (BID) | ORAL | 0 refills | Status: AC | PRN

## 2021-03-26 MED ORDER — FLUCONAZOLE 150 MG PO TABS: 150.0000 mg | ORAL_TABLET | Freq: Every day | ORAL | 1 refills | Status: AC

## 2021-03-26 MED ORDER — KETOROLAC TROMETHAMINE 60 MG/2ML IM SOLN
30.0000 mg | Freq: Once | INTRAMUSCULAR | Status: AC
  Administered 2021-03-26: 30 mg via INTRAMUSCULAR

## 2021-03-26 MED ORDER — BACLOFEN 10 MG PO TABS: 10.0000 mg | ORAL_TABLET | Freq: Three times a day (TID) | ORAL | 0 refills | Status: AC | PRN

## 2021-03-26 NOTE — ED Provider Notes (Signed)
[facility]    CSN: [id number] Arrival date & time: 03/26/21  [id number]      History   Chief Complaint Chief Complaint  Patient presents with  . Back Pain    HPI [name] is a 65 y.o. female presenting for approximately 1 week history of lower back pain that worsened over the past 3 days, especially today.  Patient says that she did not fall or injure herself.  No trauma to her back.  She says it is progressively gotten worse.  She states that whenever she bent over today and came back up she felt a lot of increased pain.  Denies any radiation of pain to her legs.  The pain does go all the way across her lower back and is aching.  Has occasional sharp pains with certain movements.  Increased pain with bending and twisting.  Also has increased pain with sitting for a long time or walking.  She has taken Tylenol had been controlling the pain well enough until today.  She denies any associated numbness, weakness or tingling.  Denies any dysuria, urinary frequency urgency and has not had any vaginal discharge or itching.  Patient reports that she is concerned about possible yeast infection or UTI causing her symptoms although she does not have any vaginal or urinary symptoms.  No leg weakness, loss of bowel or bladder control.  No other concerns.  HPI  Past Medical History:  Diagnosis Date  . Injury, blood vessel     Patient Active Problem List   Diagnosis Date Noted  . Varicose veins of left lower extremity with complications 10/28/2017  . Venous ulcer of left leg (HCC) 02/09/2016    Past Surgical History:  Procedure Laterality Date  . CHOLECYSTECTOMY    . ENDOVENOUS ABLATION SAPHENOUS VEIN W/ LASER Left 11/12/2017   endovenous laser ablation left greater saphenous vein by Josephina GipJames Lawson MD   . VASCULAR SURGERY      OB History   No obstetric history on file.      Home Medications    Prior to Admission medications   Medication Sig Start Date End Date  Taking? Authorizing Provider  baclofen (LIORESAL) 10 MG tablet Take 1 tablet (10 mg total) by mouth 3 (three) times daily as needed for up to 7 days for muscle spasms. 03/26/21 04/02/21 Yes Shirlee LatchEaves, Raissa Dam B, PA-C  fluconazole (DIFLUCAN) 150 MG tablet Take 1 tablet (150 mg total) by mouth daily for 1 day. 03/26/21 03/27/21 Yes Eusebio FriendlyEaves, Kayley Zeiders B, PA-C  naproxen (NAPROSYN) 500 MG tablet Take 1 tablet (500 mg total) by mouth 2 (two) times daily as needed for up to 10 days for moderate pain. 03/26/21 04/05/21  Eusebio FriendlyEaves, Brittnee Gaetano B, PA-C  ondansetron (ZOFRAN) 4 MG tablet Take 1 tablet (4 mg total) by mouth every 8 (eight) hours as needed for nausea or vomiting. 05/30/20   Tommie Samsook, Jayce G, DO    Family History Family History  Problem Relation Age of Onset  . Heart disease Mother   . Diabetes Mother   . Cancer Father   . Heart disease Father   . Hypertension Father     Social History Social History   Tobacco Use  . Smoking status: Never Smoker  . Smokeless tobacco: Never Used  Vaping Use  . Vaping Use: Never used  Substance Use Topics  . Alcohol use: No  . Drug use: No     Allergies   Codeine, Septra ds [sulfamethoxazole-trimethoprim], and Tramadol   Review  of Systems Review of Systems  Constitutional: Negative for chills, fatigue and fever.  Gastrointestinal: Negative for abdominal pain, diarrhea, nausea and vomiting.  Genitourinary: Negative for decreased urine volume, dysuria, flank pain, frequency, hematuria, pelvic pain, urgency, vaginal bleeding, vaginal discharge and vaginal pain.  Musculoskeletal: Positive for back pain.  Skin: Negative for rash.  Neurological: Negative for weakness and numbness.     Physical Exam Triage Vital Signs ED Triage Vitals  Enc Vitals Group     BP 03/26/21 0934 (!) 157/67     Pulse Rate 03/26/21 0934 61     Resp 03/26/21 0934 15     Temp 03/26/21 0934 98.2 F (36.8 C)     Temp Source 03/26/21 0934 Oral     SpO2 03/26/21 0934 97 %     Weight 03/26/21 0930  235 lb (106.6 kg)     Height 03/26/21 0930 5' (1.524 m)     Head Circumference --      Peak Flow --      Pain Score 03/26/21 0930 7     Pain Loc --      Pain Edu? --      Excl. in GC? --    No data found.  Updated Vital Signs BP (!) 157/67 (BP Location: Right Arm)   Pulse 61   Temp 98.2 F (36.8 C) (Oral)   Resp 15   Ht 5' (1.524 m)   Wt 235 lb (106.6 kg)   SpO2 97%   BMI 45.90 kg/m       Physical Exam Vitals and nursing note reviewed.  Constitutional:      General: She is not in acute distress.    Appearance: Normal appearance. She is obese. She is not ill-appearing or toxic-appearing.  HENT:     Head: Normocephalic and atraumatic.     Nose: Nose normal.     Mouth/Throat:     Mouth: Mucous membranes are moist.     Pharynx: Oropharynx is clear.  Eyes:     General: No scleral icterus.       Right eye: No discharge.        Left eye: No discharge.     Conjunctiva/sclera: Conjunctivae normal.  Cardiovascular:     Rate and Rhythm: Normal rate and regular rhythm.     Heart sounds: Normal heart sounds.  Pulmonary:     Effort: Pulmonary effort is normal. No respiratory distress.     Breath sounds: Normal breath sounds.  Abdominal:     Palpations: Abdomen is soft.     Tenderness: There is no abdominal tenderness. There is no right CVA tenderness or left CVA tenderness.  Musculoskeletal:     Cervical back: Neck supple.     Lumbar back: Tenderness (diffuse TTP throughout lumbar region) present. Decreased range of motion (mildly in all directions). Negative right straight leg raise test and negative left straight leg raise test.  Skin:    General: Skin is dry.  Neurological:     General: No focal deficit present.     Mental Status: She is alert. Mental status is at baseline.     Motor: No weakness.     Gait: Gait normal.  Psychiatric:        Mood and Affect: Mood normal.        Behavior: Behavior normal.        Thought Content: Thought content normal.      UC  Treatments / Results  Labs (all labs ordered are listed,  but only abnormal results are displayed) Labs Reviewed  WET PREP, GENITAL - Abnormal; Notable for the following components:      Result Value   Yeast Wet Prep HPF POC PRESENT (*)    WBC, Wet Prep HPF POC FEW (*)    All other components within normal limits  URINALYSIS, COMPLETE (UACMP) WITH MICROSCOPIC - Abnormal; Notable for the following components:   APPearance HAZY (*)    Specific Gravity, Urine <1.005 (*)    Hgb urine dipstick TRACE (*)    Leukocytes,Ua SMALL (*)    Bacteria, UA FEW (*)    All other components within normal limits  URINE CULTURE    EKG   Radiology No results found.  Procedures Procedures (including critical care time)  Medications Ordered in UC Medications  ketorolac (TORADOL) injection 30 mg (30 mg Intramuscular Given 03/26/21 1007)    Initial Impression / Assessment and Plan / UC Course  I have reviewed the triage vital signs and the nursing notes.  Pertinent labs & imaging results that were available during my care of the patient were reviewed by me and considered in my medical decision making (see chart for details).   65 year old female presenting for lower back pain.  Clinical presentation consistent with musculoskeletal type back pain.  Patient is concerned about possible yeast infection or UTI so she self swabbed for wet prep and provided a urine sample for UA.  Patient given 30 mg IM ketorolac for acute pain.  UA shows trace blood and small leukocytes, but is not a clean-catch.  We will culture urine and treat for UTI if culture is positive.  Holding off on treatment at this time since she does not have any urinary symptoms.  Wet prep is positive for yeast.  Sent Diflucan to pharmacy for yeast infection.  Advised patient on supportive care at this time with local heat and ice to the area.  Advised she can continue taking Tylenol.  I have sent naproxen and baclofen to the pharmacy.   Encouraged stretches.  I did give her a work note for a couple of days.  Advised to follow-up with PCP if not starting to improve over the next 1 to 2 weeks or for any worsening symptoms.  ED red flag signs and symptoms for back pain reviewed.   Final Clinical Impressions(s) / UC Diagnoses   Final diagnoses:  Acute bilateral low back pain without sciatica  Strain of lumbar region, initial encounter  Yeast infection  Abnormal urinalysis     Discharge Instructions     BACK PAIN: You have strained your lower back.  Stressed avoiding painful activities . RICE (REST, ICE, COMPRESSION, ELEVATION) guidelines reviewed. May alternate ice and heat. Consider use of muscle rubs, Salonpas patches, etc. Use medications as directed including muscle relaxers if prescribed. Take anti-inflammatory medications as prescribed or OTC NSAIDs/Tylenol.  F/u with PCP in 7-10 days for reexamination, and please feel free to call or return to the urgent care at any time for any questions or concerns you may have and we will be happy to help you!   BACK PAIN RED FLAGS: If the back pain acutely worsens or there are any red flag symptoms such as numbness/tingling, leg weakness, saddle anesthesia, or loss of bowel/bladder control, go immediately to the ER. Follow up with [place] as scheduled or sooner if the pain does not begin to resolve or if it worsens before the follow up    *You do have a yeast infection so  I sent Diflucan.  I do not think this is the cause for your back pain now.  *Additionally, your urinalysis is abnormal so I will send it for culture.  Someone may call you in a couple of days if the urine culture is positive for bacteria and treat you for UTI.  We will hold off at this time since you are not having any urinary symptoms.    ED Prescriptions    Medication Sig Dispense Auth. Provider   naproxen (NAPROSYN) 500 MG tablet Take 1 tablet (500 mg total) by mouth 2 (two) times daily as needed for up to 10 days  for moderate pain. 20 tablet Eusebio Friendly B, PA-C   baclofen (LIORESAL) 10 MG tablet Take 1 tablet (10 mg total) by mouth 3 (three) times daily as needed for up to 7 days for muscle spasms. 20 each Shirlee Latch, PA-C   fluconazole (DIFLUCAN) 150 MG tablet Take 1 tablet (150 mg total) by mouth daily for 1 day. 1 tablet Shirlee Latch, PA-C     I have reviewed the PDMP during this encounter.   Shirlee Latch, PA-C 03/26/21 1018

## 2021-03-26 NOTE — ED Triage Notes (Signed)
Patient c/o lower back pain that started a week ago.  Patient denies injury or fall. Patient reports pain is worse when she bends over and walks.

## 2021-03-26 NOTE — Discharge Instructions (Addendum)
BACK PAIN: You have strained your lower back.  Stressed avoiding painful activities . RICE (REST, ICE, COMPRESSION, ELEVATION) guidelines reviewed. May alternate ice and heat. Consider use of muscle rubs, Salonpas patches, etc. Use medications as directed including muscle relaxers if prescribed. Take anti-inflammatory medications as prescribed or OTC NSAIDs/Tylenol.  F/u with PCP in 7-10 days for reexamination, and please feel free to call or return to the urgent care at any time for any questions or concerns you may have and we will be happy to help you!   BACK PAIN RED FLAGS: If the back pain acutely worsens or there are any red flag symptoms such as numbness/tingling, leg weakness, saddle anesthesia, or loss of bowel/bladder control, go immediately to the ER. Follow up with [place] as scheduled or sooner if the pain does not begin to resolve or if it worsens before the follow up    *You do have a yeast infection so I sent Diflucan.  I do not think this is the cause for your back pain now.  *Additionally, your urinalysis is abnormal so I will send it for culture.  Someone may call you in a couple of days if the urine culture is positive for bacteria and treat you for UTI.  We will hold off at this time since you are not having any urinary symptoms.

## 2021-03-28 LAB — URINE CULTURE

## 2021-08-21 ENCOUNTER — Other Ambulatory Visit: Payer: Self-pay

## 2021-08-21 ENCOUNTER — Ambulatory Visit
Admission: EM | Admit: 2021-08-21 | Discharge: 2021-08-21 | Disposition: A | Discharge status: Home / Self Care | Payer: Medicare HMO | Attending: Physician Assistant | Admitting: Physician Assistant

## 2021-08-21 DIAGNOSIS — R001 Bradycardia, unspecified: Secondary | ICD-10-CM | POA: Insufficient documentation

## 2021-08-21 DIAGNOSIS — R82998 Other abnormal findings in urine: Secondary | ICD-10-CM | POA: Diagnosis not present

## 2021-08-21 DIAGNOSIS — Z881 Allergy status to other antibiotic agents status: Secondary | ICD-10-CM | POA: Diagnosis not present

## 2021-08-21 DIAGNOSIS — R143 Flatulence: Secondary | ICD-10-CM | POA: Insufficient documentation

## 2021-08-21 DIAGNOSIS — Z20822 Contact with and (suspected) exposure to covid-19: Secondary | ICD-10-CM | POA: Insufficient documentation

## 2021-08-21 DIAGNOSIS — R110 Nausea: Secondary | ICD-10-CM | POA: Diagnosis not present

## 2021-08-21 DIAGNOSIS — R739 Hyperglycemia, unspecified: Secondary | ICD-10-CM | POA: Diagnosis not present

## 2021-08-21 DIAGNOSIS — Z885 Allergy status to narcotic agent status: Secondary | ICD-10-CM | POA: Diagnosis not present

## 2021-08-21 DIAGNOSIS — Z888 Allergy status to other drugs, medicaments and biological substances status: Secondary | ICD-10-CM | POA: Insufficient documentation

## 2021-08-21 DIAGNOSIS — Z131 Encounter for screening for diabetes mellitus: Secondary | ICD-10-CM | POA: Diagnosis not present

## 2021-08-21 DIAGNOSIS — R519 Headache, unspecified: Secondary | ICD-10-CM | POA: Insufficient documentation

## 2021-08-21 DIAGNOSIS — R5383 Other fatigue: Secondary | ICD-10-CM | POA: Diagnosis not present

## 2021-08-21 DIAGNOSIS — Z833 Family history of diabetes mellitus: Secondary | ICD-10-CM | POA: Insufficient documentation

## 2021-08-21 DIAGNOSIS — R531 Weakness: Secondary | ICD-10-CM | POA: Diagnosis present

## 2021-08-21 LAB — CBC WITH DIFFERENTIAL/PLATELET
Abs Immature Granulocytes: 0.03 10*3/uL (ref 0.00–0.07)
Basophils Absolute: 0.1 10*3/uL (ref 0.0–0.1)
Basophils Relative: 1 %
Eosinophils Absolute: 0.1 10*3/uL (ref 0.0–0.5)
Eosinophils Relative: 1 %
HCT: 43.8 % (ref 36.0–46.0)
Hemoglobin: 14.6 g/dL (ref 12.0–15.0)
Immature Granulocytes: 0 %
Lymphocytes Relative: 27 %
Lymphs Abs: 2.4 10*3/uL (ref 0.7–4.0)
MCH: 31.1 pg (ref 26.0–34.0)
MCHC: 33.3 g/dL (ref 30.0–36.0)
MCV: 93.4 fL (ref 80.0–100.0)
Monocytes Absolute: 0.6 10*3/uL (ref 0.1–1.0)
Monocytes Relative: 6 %
Neutro Abs: 5.9 10*3/uL (ref 1.7–7.7)
Neutrophils Relative %: 65 %
Platelets: 280 10*3/uL (ref 150–400)
RBC: 4.69 MIL/uL (ref 3.87–5.11)
RDW: 12.8 % (ref 11.5–15.5)
WBC: 9.1 10*3/uL (ref 4.0–10.5)
nRBC: 0 % (ref 0.0–0.2)

## 2021-08-21 LAB — WET PREP, GENITAL
Clue Cells Wet Prep HPF POC: NONE SEEN
Sperm: NONE SEEN
Trich, Wet Prep: NONE SEEN
Yeast Wet Prep HPF POC: NONE SEEN

## 2021-08-21 LAB — COMPREHENSIVE METABOLIC PANEL
ALT: 36 U/L (ref 0–44)
AST: 33 U/L (ref 15–41)
Albumin: 4.2 g/dL (ref 3.5–5.0)
Alkaline Phosphatase: 96 U/L (ref 38–126)
Anion gap: 6 (ref 5–15)
BUN: 18 mg/dL (ref 8–23)
CO2: 29 mmol/L (ref 22–32)
Calcium: 9.4 mg/dL (ref 8.9–10.3)
Chloride: 101 mmol/L (ref 98–111)
Creatinine, Ser: 0.73 mg/dL (ref 0.44–1.00)
GFR, Estimated: >60 mL/min (ref >60)
Glucose, Bld: 103 mg/dL — ABNORMAL HIGH (ref 70–99)
Potassium: 4.5 mmol/L (ref 3.5–5.1)
Sodium: 136 mmol/L (ref 135–145)
Total Bilirubin: 0.6 mg/dL (ref 0.3–1.2)
Total Protein: 7.6 g/dL (ref 6.5–8.1)

## 2021-08-21 LAB — URINALYSIS, COMPLETE (UACMP) WITH MICROSCOPIC
Bilirubin Urine: NEGATIVE
Glucose, UA: NEGATIVE mg/dL
Ketones, ur: NEGATIVE mg/dL
Nitrite: NEGATIVE
Protein, ur: NEGATIVE mg/dL
Specific Gravity, Urine: <1.005 — ABNORMAL LOW (ref 1.005–1.030)
pH: 5.5 (ref 5.0–8.0)

## 2021-08-21 LAB — SARS CORONAVIRUS 2 (TAT 6-24 HRS): SARS Coronavirus 2: NEGATIVE

## 2021-08-21 LAB — TSH: TSH: 4.97 u[IU]/mL — ABNORMAL HIGH (ref 0.350–4.500)

## 2021-08-21 LAB — HEMOGLOBIN A1C
Hgb A1c MFr Bld: 5.7 % — ABNORMAL HIGH (ref 4.8–5.6)
Mean Plasma Glucose: 116.89 mg/dL

## 2021-08-21 MED ORDER — ONDANSETRON HCL 4 MG PO TABS: 4.0000 mg | ORAL_TABLET | Freq: Four times a day (QID) | ORAL | 0 refills | Status: AC | PRN

## 2021-08-21 NOTE — Discharge Instructions (Addendum)
ContinueFATIGUE/WEAKNESS/NAUSEA: You do not have a yeast infection.  The urine does not appear to be consistent with a urinary tract infection but I am sending it for culture and someone will contact you if you need antibiotics in a couple of days.  Stay hydrated.  The lab work is all reassuring.  Your metabolic panel and complete blood count are normal.  I did also get labs for an A1c which is a test for diabetes and thyroid test.  The results be back later today or tomorrow and someone will contact you if abnormal.  All results will be on your MyChart.  So, we did not find any reason for your symptoms today.  COVID test obtained and similar information below.  At this time I would advise you to rest.  It could be that you overdid it recently and got overheated.  If you develop any new acute worsening of symptoms such as severe headaches, dizziness, feeling faint or passing out, chest pain, breathing difficulty, severe abdominal pain or dehydration then he should be seen in the emergency department.  COVID TESTING:  You have received COVID testing today either for positive exposure, concerning symptoms that could be related to COVID infection, screening purposes, or re-testing after confirmed positive.  Your test obtained today checks for active viral infection in the last 1-2 weeks. If your test is negative now, you can still test positive later. So, if you do develop symptoms you should either get re-tested and/or isolate x 5 days and then strict mask use x 5 days (unvaccinated) or mask use x 10 days (vaccinated). Please follow CDC guidelines.  While Rapid antigen tests come back in 15-20 minutes, send out PCR/molecular test results typically come back within 1-3 days. In the mean time, if you are symptomatic, assume this could be a positive test and treat/monitor yourself as if you do have COVID.   We will call with test results if positive. Please download the MyChart app and set up a profile to access  test results.   If symptomatic, go home and rest. Push fluids. Take Tylenol as needed for discomfort. Gargle warm salt water. Throat lozenges. Take Mucinex DM or Robitussin for cough. Humidifier in bedroom to ease coughing. Warm showers. Also review the COVID handout for more information.  COVID-19 INFECTION: The incubation period of COVID-19 is approximately 14 days after exposure, with most symptoms developing in roughly 4-5 days. Symptoms may range in severity from mild to critically severe. Roughly 80% of those infected will have mild symptoms. People of any age may become infected with COVID-19 and have the ability to transmit the virus. The most common symptoms include: fever, fatigue, cough, body aches, headaches, sore throat, nasal congestion, shortness of breath, nausea, vomiting, diarrhea, changes in smell and/or taste.    COURSE OF ILLNESS Some patients may begin with mild disease which can progress quickly into critical symptoms. If your symptoms are worsening please call ahead to the Emergency Department and proceed there for further treatment. Recovery time appears to be roughly 1-2 weeks for mild symptoms and 3-6 weeks for severe disease.   GO IMMEDIATELY TO ER FOR FEVER YOU ARE UNABLE TO GET DOWN WITH TYLENOL, BREATHING PROBLEMS, CHEST PAIN, FATIGUE, LETHARGY, INABILITY TO EAT OR DRINK, ETC  QUARANTINE AND ISOLATION: To help decrease the spread of COVID-19 please remain isolated if you have COVID infection or are highly suspected to have COVID infection. This means -stay home and isolate to one room in the home if  you live with others. Do not share a bed or bathroom with others while ill, sanitize and wipe down all countertops and keep common areas clean and disinfected. Stay home for 5 days. If you have no symptoms or your symptoms are resolving after 5 days, you can leave your house. Continue to wear a mask around others for 5 additional days. If you have been in close contact (within 6  feet) of someone diagnosed with COVID 19, you are advised to quarantine in your home for 14 days as symptoms can develop anywhere from 2-14 days after exposure to the virus. If you develop symptoms, you  must isolate.  Most current guidelines for COVID after exposure -unvaccinated: isolate 5 days and strict mask use x 5 days. Test on day 5 is possible -vaccinated: wear mask x 10 days if symptoms do not develop -You do not necessarily need to be tested for COVID if you have + exposure and  develop symptoms. Just isolate at home x10 days from symptom onset During this global pandemic, CDC advises to practice social distancing, try to stay at least 74ft away from others at all times. Wear a face covering. Wash and sanitize your hands regularly and avoid going anywhere that is not necessary.  KEEP IN MIND THAT THE COVID TEST IS NOT 100% ACCURATE AND YOU SHOULD STILL DO EVERYTHING TO PREVENT POTENTIAL SPREAD OF VIRUS TO OTHERS (WEAR MASK, WEAR GLOVES, WASH HANDS AND SANITIZE REGULARLY). IF INITIAL TEST IS NEGATIVE, THIS MAY NOT MEAN YOU ARE DEFINITELY NEGATIVE. MOST ACCURATE TESTING IS DONE 5-7 DAYS AFTER EXPOSURE.   It is not advised by CDC to get re-tested after receiving a positive COVID test since you can still test positive for weeks to months after you have already cleared the virus.   *If you have not been vaccinated for COVID, I strongly suggest you consider getting vaccinated as long as there are no contraindications.

## 2021-08-21 NOTE — ED Provider Notes (Signed)
[facility]    CSN: [id number] Arrival date & time: 08/21/21  6144      History   Chief Complaint Chief Complaint  Patient presents with   Fatigue   Nausea    HPI [name] is a 65 y.o. female presenting for complaints of generalized weakness and fatigue x2 days.  Patient says her legs feel like "jelly."  Patient states symptoms started after she was outside in the heat.  She says she is try to hydrate well but still feels fatigued.  She admits to mild intermittent headaches.  Denies any dizziness, syncope or falls.  Patient says she occasionally feels nauseated and often has belching and flatulence.  Patient admits that her bowel habits are normal and she has 2 BMs per day.  She denies any abdominal pain.  She denies any dysuria, urinary frequency urgency but has noticed "vaginal odor."  She believes she could have a yeast infection based on this.  She denies any fevers, body aches, cough, congestion, sore throat, chest pain, breathing difficulty, vomiting or diarrhea.  Denies any sick contacts and no known exposure to COVID-19.  Patient admits to history of arthritis and varicose veins.  She states she does not currently have a PCP.  She was waiting to get on Medicare before finding a PCP and she just recently got on Medicare about 2 months ago.  Patient states she plans to go to [place] [facility] today to try to get a set up with her PCP but wanted to be assessed in the meantime.  Patient does admit to some concerns about possible diabetes.  He says it has been years since she is having blood work drawn.  Patient is not fasting today.  She states she drank coffee and milk and had a 100-calorie pack of cookies earlier today.  States she consumed all of this about 4 hours ago.  Patient does report that she has had episodes where she has felt the same in the past that has passed.  Patient has no other complaints or concerns.  HPI  Past Medical History:  Diagnosis Date    Injury, blood vessel     Patient Active Problem List   Diagnosis Date Noted   Varicose veins of left lower extremity with complications 10/28/2017   Venous ulcer of left leg (HCC) 02/09/2016    Past Surgical History:  Procedure Laterality Date   CHOLECYSTECTOMY     ENDOVENOUS ABLATION SAPHENOUS VEIN W/ LASER Left 11/12/2017   endovenous laser ablation left greater saphenous vein by Josephina Gip MD    VASCULAR SURGERY      OB History   No obstetric history on file.      Home Medications    Prior to Admission medications   Medication Sig Start Date End Date Taking? Authorizing Provider  ondansetron (ZOFRAN) 4 MG tablet Take 1 tablet (4 mg total) by mouth every 6 (six) hours as needed for up to 5 days for nausea or vomiting. 08/21/21 08/26/21  Shirlee Latch, PA-C    Family History Family History  Problem Relation Age of Onset   Heart disease Mother    Diabetes Mother    Cancer Father    Heart disease Father    Hypertension Father     Social History Social History   Tobacco Use   Smoking status: Never   Smokeless tobacco: Never  Vaping Use   Vaping Use: Never used  Substance Use Topics   Alcohol use: No  Drug use: No     Allergies   Codeine, Septra ds [sulfamethoxazole-trimethoprim], and Tramadol   Review of Systems Review of Systems  Constitutional:  Positive for fatigue. Negative for chills, diaphoresis and fever.  HENT:  Negative for congestion, rhinorrhea and sore throat.   Respiratory:  Negative for cough and shortness of breath.   Cardiovascular:  Negative for chest pain and palpitations.  Gastrointestinal:  Positive for nausea. Negative for abdominal pain and vomiting.  Genitourinary:  Positive for frequency (increased frequency since increasing fluid intake recently). Negative for dysuria, hematuria, vaginal discharge and vaginal pain.       Vaginal odor  Musculoskeletal:  Negative for arthralgias and myalgias.  Skin:  Negative for rash.   Neurological:  Positive for weakness and headaches. Negative for dizziness, syncope and numbness.  Hematological:  Negative for adenopathy.    Physical Exam Triage Vital Signs ED Triage Vitals  Enc Vitals Group     BP 08/21/21 0954 140/80     Pulse Rate 08/21/21 0954 (!) 56     Resp 08/21/21 0954 18     Temp 08/21/21 0954 98.7 F (37.1 C)     Temp Source 08/21/21 0954 Oral     SpO2 08/21/21 0954 99 %     Weight 08/21/21 0953 250 lb (113.4 kg)     Height 08/21/21 0953 5' (1.524 m)     Head Circumference --      Peak Flow --      Pain Score 08/21/21 0953 0     Pain Loc --      Pain Edu? --      Excl. in GC? --    No data found.  Updated Vital Signs BP 140/80 (BP Location: Left Arm)   Pulse (!) 56   Temp 98.7 F (37.1 C) (Oral)   Resp 18   Ht 5' (1.524 m)   Wt 250 lb (113.4 kg)   SpO2 99%   BMI 48.82 kg/m      Physical Exam Vitals and nursing note reviewed.  Constitutional:      General: She is not in acute distress.    Appearance: Normal appearance. She is not ill-appearing or toxic-appearing.  HENT:     Head: Normocephalic and atraumatic.     Nose: Nose normal.     Mouth/Throat:     Mouth: Mucous membranes are moist.     Pharynx: Oropharynx is clear.  Eyes:     General: No scleral icterus.       Right eye: No discharge.        Left eye: No discharge.     Extraocular Movements: Extraocular movements intact.     Conjunctiva/sclera: Conjunctivae normal.     Pupils: Pupils are equal, round, and reactive to light.  Cardiovascular:     Rate and Rhythm: Regular rhythm. Bradycardia present.     Heart sounds: Normal heart sounds.  Pulmonary:     Effort: Pulmonary effort is normal. No respiratory distress.     Breath sounds: Normal breath sounds.  Abdominal:     General: Abdomen is protuberant.     Palpations: Abdomen is soft.     Tenderness: There is no abdominal tenderness. There is no right CVA tenderness or left CVA tenderness.  Musculoskeletal:      Cervical back: Neck supple.  Skin:    General: Skin is dry.  Neurological:     General: No focal deficit present.     Mental Status: She is alert and  oriented to person, place, and time. Mental status is at baseline.     Cranial Nerves: No cranial nerve deficit.     Motor: No weakness.     Gait: Gait normal.  Psychiatric:        Mood and Affect: Mood normal.        Behavior: Behavior normal.        Thought Content: Thought content normal.     UC Treatments / Results  Labs (all labs ordered are listed, but only abnormal results are displayed) Labs Reviewed  WET PREP, GENITAL - Abnormal; Notable for the following components:      Result Value   WBC, Wet Prep HPF POC FEW (*)    All other components within normal limits  URINALYSIS, COMPLETE (UACMP) WITH MICROSCOPIC - Abnormal; Notable for the following components:   Specific Gravity, Urine <1.005 (*)    Hgb urine dipstick TRACE (*)    Leukocytes,Ua MODERATE (*)    Bacteria, UA FEW (*)    All other components within normal limits  COMPREHENSIVE METABOLIC PANEL - Abnormal; Notable for the following components:   Glucose, Bld 103 (*)    All other components within normal limits  URINE CULTURE  SARS CORONAVIRUS 2 (TAT 6-24 HRS)  CBC WITH DIFFERENTIAL/PLATELET  TSH  HEMOGLOBIN A1C    EKG   Radiology No results found.  Procedures ED EKG  Date/Time: 08/21/2021 10:31 AM Performed by: Shirlee Latch, PA-C Authorized by: Shirlee Latch, PA-C   ECG reviewed by ED Physician in the absence of a cardiologist: yes   Previous ECG:    Previous ECG:  Compared to current   Similarity:  No change Interpretation:    Interpretation: abnormal   Rate:    ECG rate:  52   ECG rate assessment: bradycardic   Rhythm:    Rhythm: sinus rhythm   Ectopy:    Ectopy: none   QRS:    QRS axis:  Normal   QRS intervals:  Normal   QRS conduction: normal   ST segments:    ST segments:  Normal T waves:    T waves: normal   Comments:      Sinus bradycardia. Compared with EKG from 10/24/2015 which also shows sinus bradycardia (including critical care time)  Medications Ordered in UC Medications - No data to display  Initial Impression / Assessment and Plan / UC Course  I have reviewed the triage vital signs and the nursing notes.  Pertinent labs & imaging results that were available during my care of the patient were reviewed by me and considered in my medical decision making (see chart for details).  65 year old female presenting for generalized weakness, fatigue and nausea over the past couple of days.  She also reports some mild headaches and increased flatulence and belching.  States she has increased her fluid intake and subsequently has had increased urination.  Vitals are all stable.  Her pulse is 56 bpm.  EKG performed today shows sinus bradycardia.  This was compared to an EKG from October 2016 which showed the same.  Urinalysis today shows specific gravity less than 1.005, trace blood and moderate leukocytes.  We will send urine for culture and treat for UTI if positive for bacteria but holding off this time since her only urinary complaint is frequency.  Wet prep is normal.  Ordered CBC, CMP, A1c and TSH given that her symptoms are vague and she has not had labs checked in a few years.  CBC normal.  CMP shows glucose slightly elevated at 103 but patient is nonfasting.  Pending A1c and TSH.  Reviewed all lab results with patient.  Advised patient her symptoms are very generalized and nonspecific.  Could be related to being out in the heat and not having an air conditioner in her vehicle.  Advised her to continue to stay hydrated and rest.  We will call with the results of any other labs if abnormal.  Advised patient to work on finding a PCP who can continue to follow-up with her given these complaints.  I did review strict ED precautions with patient.  Work note given for the next couple days.  Final Clinical  Impressions(s) / UC Diagnoses   Final diagnoses:  Fatigue, unspecified type  Generalized weakness  Nausea without vomiting     Discharge Instructions      ContinueFATIGUE/WEAKNESS/NAUSEA: You do not have a yeast infection.  The urine does not appear to be consistent with a urinary tract infection but I am sending it for culture and someone will contact you if you need antibiotics in a couple of days.  Stay hydrated.  The lab work is all reassuring.  Your metabolic panel and complete blood count are normal.  I did also get labs for an A1c which is a test for diabetes and thyroid test.  The results be back later today or tomorrow and someone will contact you if abnormal.  All results will be on your MyChart.  So, we did not find any reason for your symptoms today.  COVID test obtained and similar information below.  At this time I would advise you to rest.  It could be that you overdid it recently and got overheated.  If you develop any new acute worsening of symptoms such as severe headaches, dizziness, feeling faint or passing out, chest pain, breathing difficulty, severe abdominal pain or dehydration then he should be seen in the emergency department.  COVID TESTING:  You have received COVID testing today either for positive exposure, concerning symptoms that could be related to COVID infection, screening purposes, or re-testing after confirmed positive.  Your test obtained today checks for active viral infection in the last 1-2 weeks. If your test is negative now, you can still test positive later. So, if you do develop symptoms you should either get re-tested and/or isolate x 5 days and then strict mask use x 5 days (unvaccinated) or mask use x 10 days (vaccinated). Please follow CDC guidelines.  While Rapid antigen tests come back in 15-20 minutes, send out PCR/molecular test results typically come back within 1-3 days. In the mean time, if you are symptomatic, assume this could be a positive  test and treat/monitor yourself as if you do have COVID.   We will call with test results if positive. Please download the MyChart app and set up a profile to access test results.   If symptomatic, go home and rest. Push fluids. Take Tylenol as needed for discomfort. Gargle warm salt water. Throat lozenges. Take Mucinex DM or Robitussin for cough. Humidifier in bedroom to ease coughing. Warm showers. Also review the COVID handout for more information.  COVID-19 INFECTION: The incubation period of COVID-19 is approximately 14 days after exposure, with most symptoms developing in roughly 4-5 days. Symptoms may range in severity from mild to critically severe. Roughly 80% of those infected will have mild symptoms. People of any age may become infected with COVID-19 and have the ability to transmit the virus.  The most common symptoms include: fever, fatigue, cough, body aches, headaches, sore throat, nasal congestion, shortness of breath, nausea, vomiting, diarrhea, changes in smell and/or taste.    COURSE OF ILLNESS Some patients may begin with mild disease which can progress quickly into critical symptoms. If your symptoms are worsening please call ahead to the Emergency Department and proceed there for further treatment. Recovery time appears to be roughly 1-2 weeks for mild symptoms and 3-6 weeks for severe disease.   GO IMMEDIATELY TO ER FOR FEVER YOU ARE UNABLE TO GET DOWN WITH TYLENOL, BREATHING PROBLEMS, CHEST PAIN, FATIGUE, LETHARGY, INABILITY TO EAT OR DRINK, ETC  QUARANTINE AND ISOLATION: To help decrease the spread of COVID-19 please remain isolated if you have COVID infection or are highly suspected to have COVID infection. This means -stay home and isolate to one room in the home if you live with others. Do not share a bed or bathroom with others while ill, sanitize and wipe down all countertops and keep common areas clean and disinfected. Stay home for 5 days. If you have no symptoms or your  symptoms are resolving after 5 days, you can leave your house. Continue to wear a mask around others for 5 additional days. If you have been in close contact (within 6 feet) of someone diagnosed with COVID 19, you are advised to quarantine in your home for 14 days as symptoms can develop anywhere from 2-14 days after exposure to the virus. If you develop symptoms, you  must isolate.  Most current guidelines for COVID after exposure -unvaccinated: isolate 5 days and strict mask use x 5 days. Test on day 5 is possible -vaccinated: wear mask x 10 days if symptoms do not develop -You do not necessarily need to be tested for COVID if you have + exposure and  develop symptoms. Just isolate at home x10 days from symptom onset During this global pandemic, CDC advises to practice social distancing, try to stay at least 50ft away from others at all times. Wear a face covering. Wash and sanitize your hands regularly and avoid going anywhere that is not necessary.  KEEP IN MIND THAT THE COVID TEST IS NOT 100% ACCURATE AND YOU SHOULD STILL DO EVERYTHING TO PREVENT POTENTIAL SPREAD OF VIRUS TO OTHERS (WEAR MASK, WEAR GLOVES, WASH HANDS AND SANITIZE REGULARLY). IF INITIAL TEST IS NEGATIVE, THIS MAY NOT MEAN YOU ARE DEFINITELY NEGATIVE. MOST ACCURATE TESTING IS DONE 5-7 DAYS AFTER EXPOSURE.   It is not advised by CDC to get re-tested after receiving a positive COVID test since you can still test positive for weeks to months after you have already cleared the virus.   *If you have not been vaccinated for COVID, I strongly suggest you consider getting vaccinated as long as there are no contraindications.       ED Prescriptions     Medication Sig Dispense Auth. Provider   ondansetron (ZOFRAN) 4 MG tablet Take 1 tablet (4 mg total) by mouth every 6 (six) hours as needed for up to 5 days for nausea or vomiting. 20 tablet Gareth Morgan      PDMP not reviewed this encounter.   Shirlee Latch,  PA-C 08/21/21 1155

## 2021-08-21 NOTE — ED Triage Notes (Signed)
Pt here with C/O feeling weak and tired since Saturday off and on."Can't explain it just feels sick" States it happened a couple months ago. Doesn't have a PCP, is looking for one.

## 2021-08-22 LAB — URINE CULTURE: Culture: 50000 — AB

## 2022-04-15 ENCOUNTER — Emergency Department: Payer: Medicare HMO

## 2022-04-15 ENCOUNTER — Ambulatory Visit (INDEPENDENT_AMBULATORY_CARE_PROVIDER_SITE_OTHER)
Admission: EM | Admit: 2022-04-15 | Discharge: 2022-04-15 | Disposition: A | Discharge status: Home / Self Care | Payer: Medicare HMO | Source: Home / Self Care

## 2022-04-15 ENCOUNTER — Other Ambulatory Visit: Payer: Self-pay

## 2022-04-15 ENCOUNTER — Inpatient Hospital Stay
Admission: EM | Admit: 2022-04-15 | Discharge: 2022-04-19 | DRG: 445 | Disposition: A | Discharge status: Home Health | Payer: Medicare HMO | Attending: Osteopathic Medicine | Admitting: Osteopathic Medicine

## 2022-04-15 ENCOUNTER — Encounter: Payer: Self-pay | Admitting: Emergency Medicine

## 2022-04-15 DIAGNOSIS — K8051 Calculus of bile duct without cholangitis or cholecystitis with obstruction: Secondary | ICD-10-CM | POA: Diagnosis present

## 2022-04-15 DIAGNOSIS — E871 Hypo-osmolality and hyponatremia: Secondary | ICD-10-CM

## 2022-04-15 DIAGNOSIS — E861 Hypovolemia: Secondary | ICD-10-CM | POA: Diagnosis present

## 2022-04-15 DIAGNOSIS — R17 Unspecified jaundice: Secondary | ICD-10-CM

## 2022-04-15 DIAGNOSIS — Z6841 Body Mass Index (BMI) 40.0 and over, adult: Secondary | ICD-10-CM

## 2022-04-15 DIAGNOSIS — R7989 Other specified abnormal findings of blood chemistry: Secondary | ICD-10-CM

## 2022-04-15 DIAGNOSIS — Z888 Allergy status to other drugs, medicaments and biological substances status: Secondary | ICD-10-CM | POA: Diagnosis not present

## 2022-04-15 DIAGNOSIS — E876 Hypokalemia: Secondary | ICD-10-CM | POA: Diagnosis present

## 2022-04-15 DIAGNOSIS — R822 Biliuria: Secondary | ICD-10-CM

## 2022-04-15 DIAGNOSIS — R1031 Right lower quadrant pain: Secondary | ICD-10-CM

## 2022-04-15 DIAGNOSIS — Z9049 Acquired absence of other specified parts of digestive tract: Secondary | ICD-10-CM

## 2022-04-15 DIAGNOSIS — K8050 Calculus of bile duct without cholangitis or cholecystitis without obstruction: Secondary | ICD-10-CM | POA: Diagnosis present

## 2022-04-15 DIAGNOSIS — Z885 Allergy status to narcotic agent status: Secondary | ICD-10-CM

## 2022-04-15 DIAGNOSIS — Z882 Allergy status to sulfonamides status: Secondary | ICD-10-CM

## 2022-04-15 DIAGNOSIS — Z833 Family history of diabetes mellitus: Secondary | ICD-10-CM | POA: Diagnosis not present

## 2022-04-15 DIAGNOSIS — Z8249 Family history of ischemic heart disease and other diseases of the circulatory system: Secondary | ICD-10-CM

## 2022-04-15 LAB — COMPREHENSIVE METABOLIC PANEL
ALT: 506 U/L — ABNORMAL HIGH (ref 0–44)
AST: 196 U/L — ABNORMAL HIGH (ref 15–41)
Albumin: 3.8 g/dL (ref 3.5–5.0)
Alkaline Phosphatase: 300 U/L — ABNORMAL HIGH (ref 38–126)
Anion gap: 7 (ref 5–15)
BUN: 10 mg/dL (ref 8–23)
CO2: 27 mmol/L (ref 22–32)
Calcium: 9.1 mg/dL (ref 8.9–10.3)
Chloride: 99 mmol/L (ref 98–111)
Creatinine, Ser: 0.7 mg/dL (ref 0.44–1.00)
GFR, Estimated: >60 mL/min (ref >60)
Glucose, Bld: 117 mg/dL — ABNORMAL HIGH (ref 70–99)
Potassium: 3.3 mmol/L — ABNORMAL LOW (ref 3.5–5.1)
Sodium: 133 mmol/L — ABNORMAL LOW (ref 135–145)
Total Bilirubin: 7.6 mg/dL — ABNORMAL HIGH (ref 0.3–1.2)
Total Protein: 7.7 g/dL (ref 6.5–8.1)

## 2022-04-15 LAB — URINALYSIS, ROUTINE W REFLEX MICROSCOPIC
Glucose, UA: NEGATIVE mg/dL
Nitrite: NEGATIVE
Protein, ur: 100 mg/dL — AB
Specific Gravity, Urine: 1.015 (ref 1.005–1.030)
pH: 5.5 (ref 5.0–8.0)

## 2022-04-15 LAB — BILIRUBIN, DIRECT: Bilirubin, Direct: 4.9 mg/dL — ABNORMAL HIGH (ref 0.0–0.2)

## 2022-04-15 LAB — CBC
HCT: 43.5 % (ref 36.0–46.0)
Hemoglobin: 14.6 g/dL (ref 12.0–15.0)
MCH: 31.5 pg (ref 26.0–34.0)
MCHC: 33.6 g/dL (ref 30.0–36.0)
MCV: 93.8 fL (ref 80.0–100.0)
Platelets: 234 10*3/uL (ref 150–400)
RBC: 4.64 MIL/uL (ref 3.87–5.11)
RDW: 13.1 % (ref 11.5–15.5)
WBC: 9.9 10*3/uL (ref 4.0–10.5)
nRBC: 0 % (ref 0.0–0.2)

## 2022-04-15 LAB — URINALYSIS, MICROSCOPIC (REFLEX): Squamous Epithelial / HPF: >50 (ref 0–5)

## 2022-04-15 LAB — MAGNESIUM: Magnesium: 1.8 mg/dL (ref 1.7–2.4)

## 2022-04-15 LAB — LIPASE, BLOOD: Lipase: 23 U/L (ref 11–51)

## 2022-04-15 IMAGING — MR MR 3D RECON AT [facility]
2 series · 12 of 16 positions shown · IV contrast (gadavist)
Comparison: Same day CT abdomen pelvis, [DATE]

CLINICAL DATA: Biliary obstruction suspected

EXAM:
MRI ABDOMEN WITHOUT AND WITH CONTRAST (INCLUDING MRCP)
TECHNIQUE: Multiplanar multisequence MR imaging of the abdomen was performed
both before and after the administration of intravenous contrast.
Heavily T2-weighted images of the biliary and pancreatic ducts were
obtained, and three-dimensional MRCP images were rendered by post
processing.
CONTRAST:  10mL GADAVIST GADOBUTROL 1 MMOL/ML IV SOLN

[Series 13: MRCP · coronal · 1.0mm · 0.49mm/px · 11 of 72 slices shown (1 of 2)]
[im 1/72]
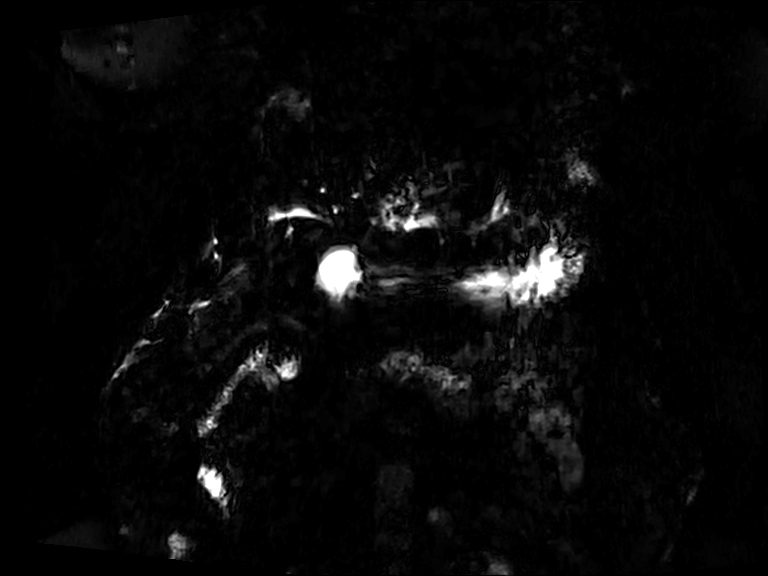
[im 11/72]
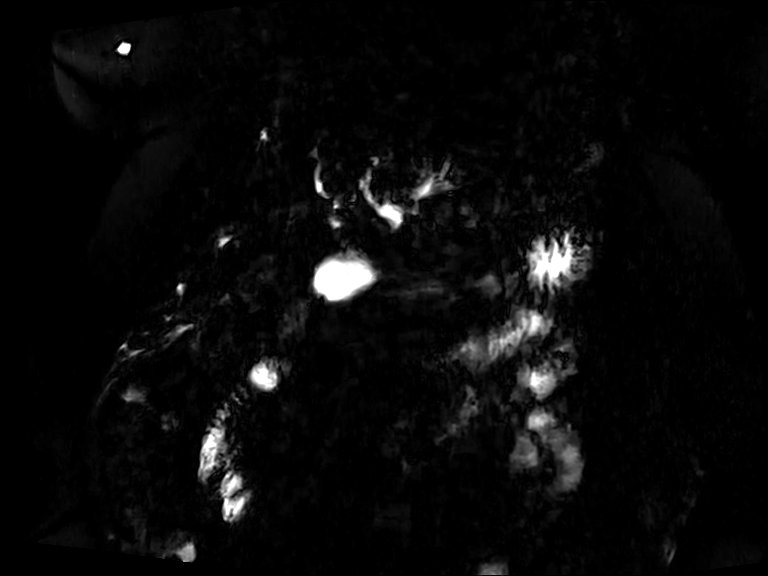
[im 16/72]
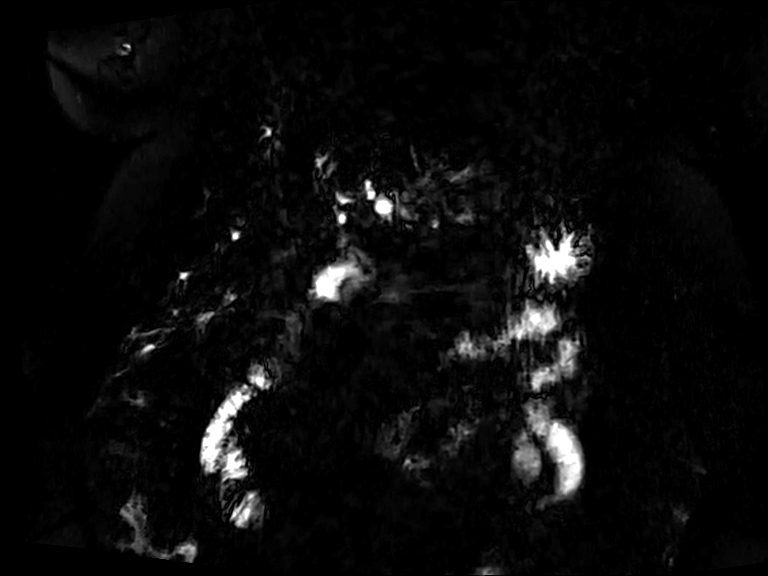
[im 21/72]
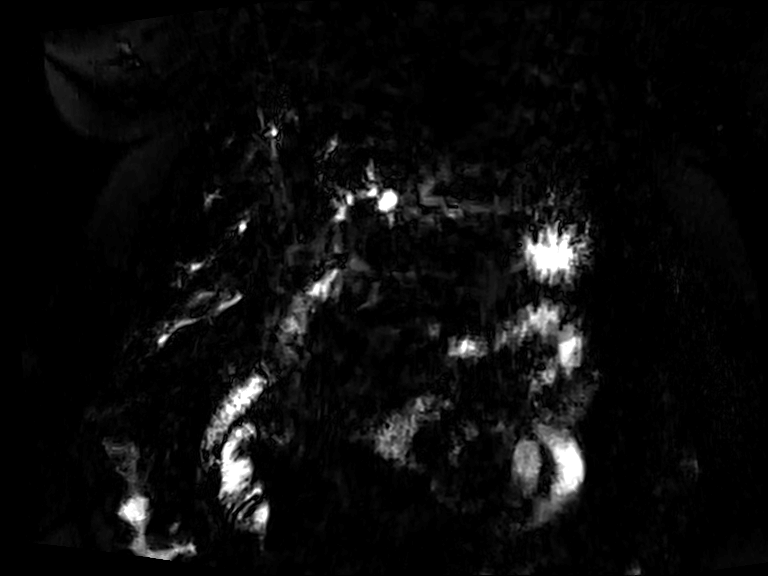
[im 31/72]
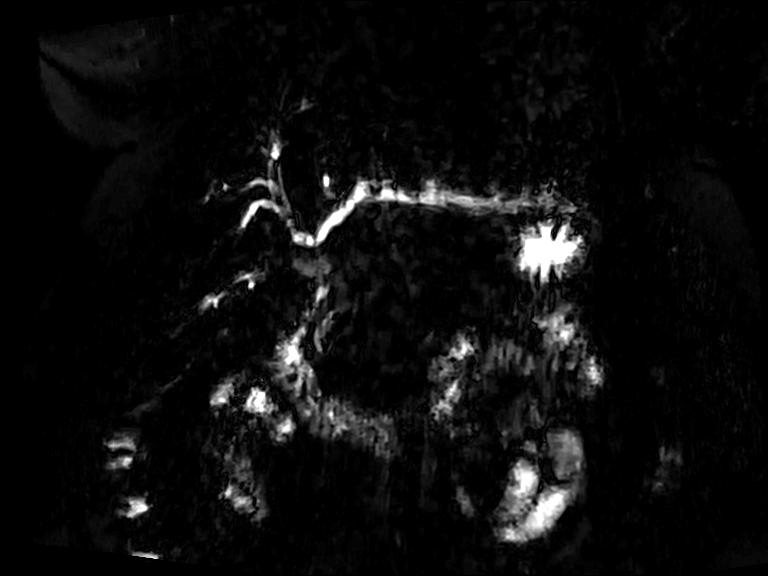
[im 36/72]
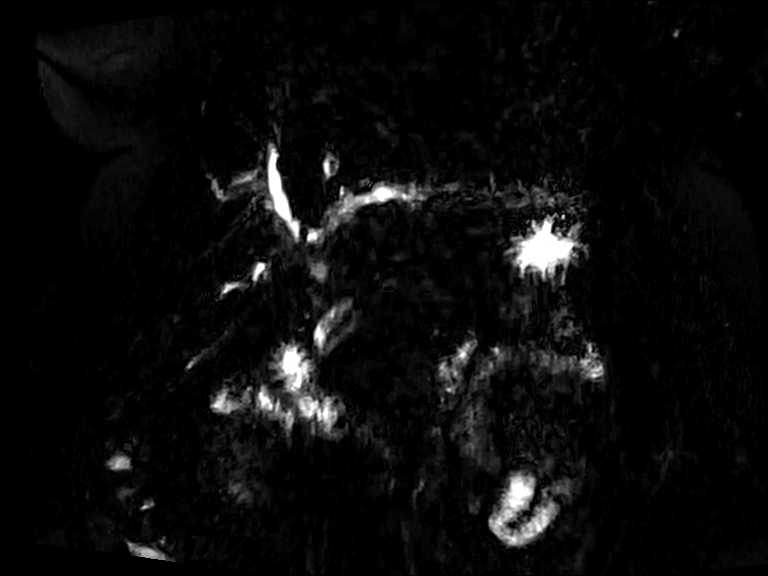
[im 41/72]
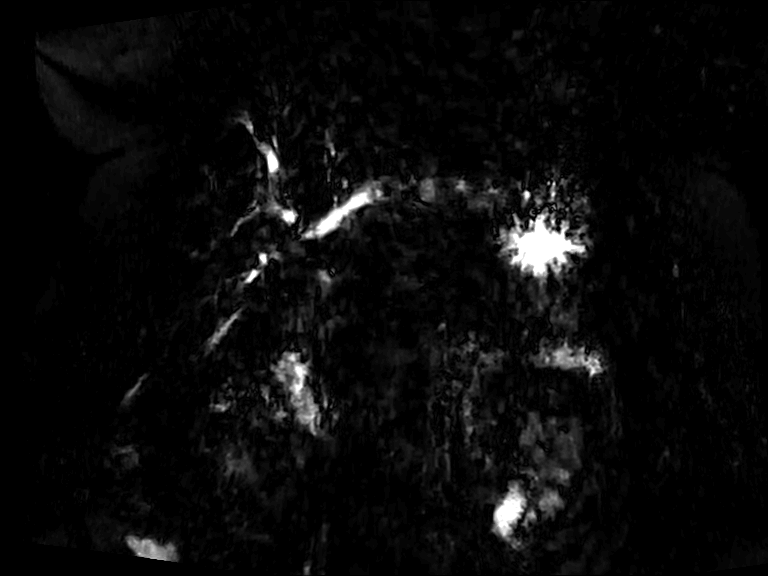
[im 51/72]
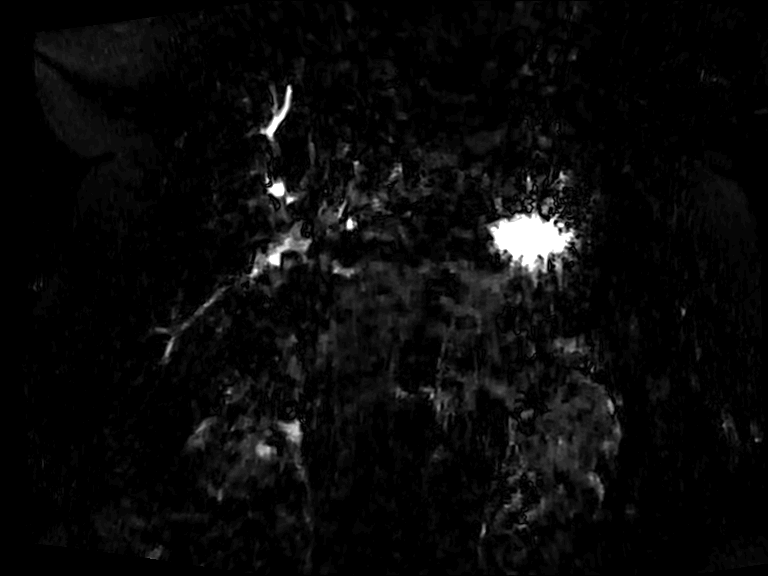
[im 56/72]
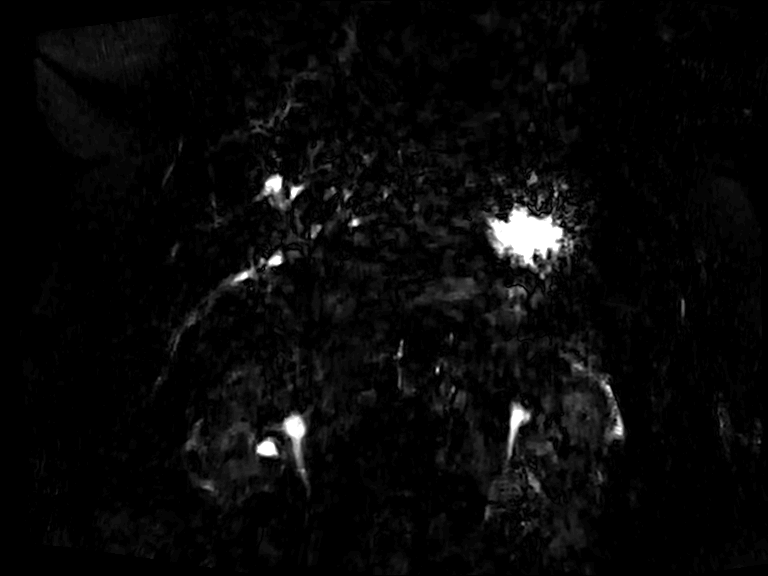
[im 61/72]
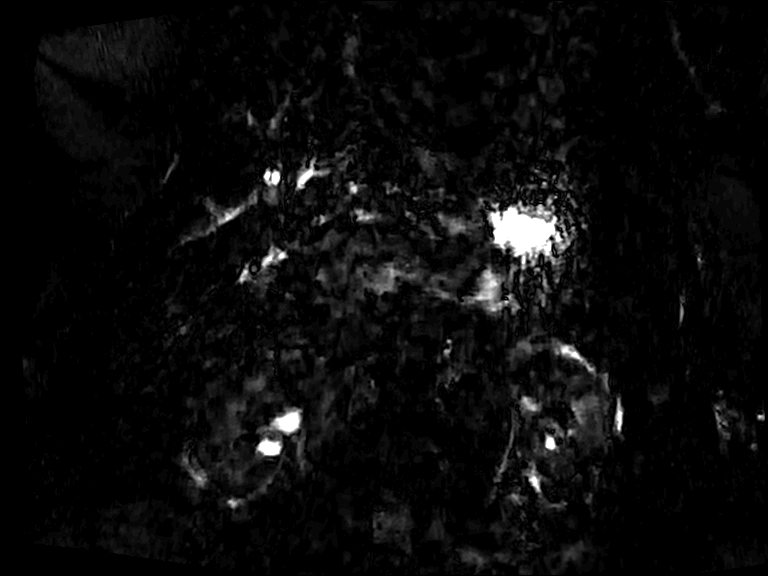
[im 72/72]
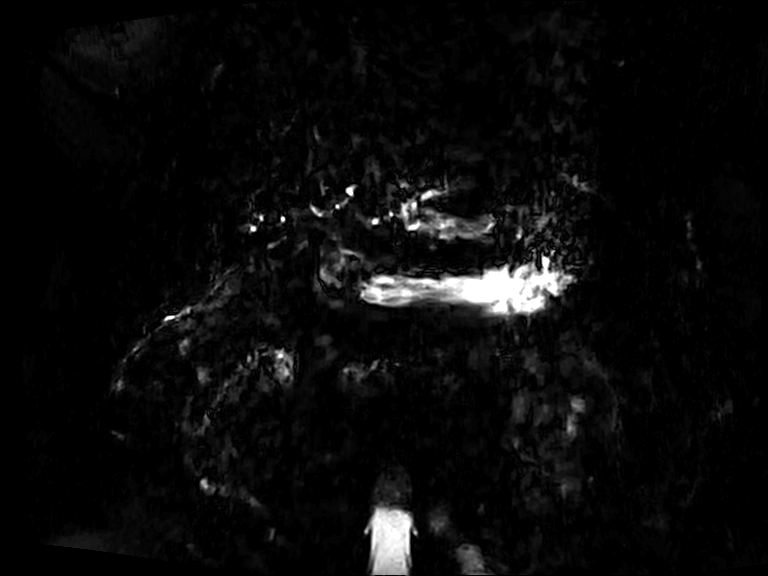

[Series 1034: MRCP · sagittal · 479.6mm · 0.49mm/px · 1 of 6 slices shown (2 of 2)]
[im 1/6]
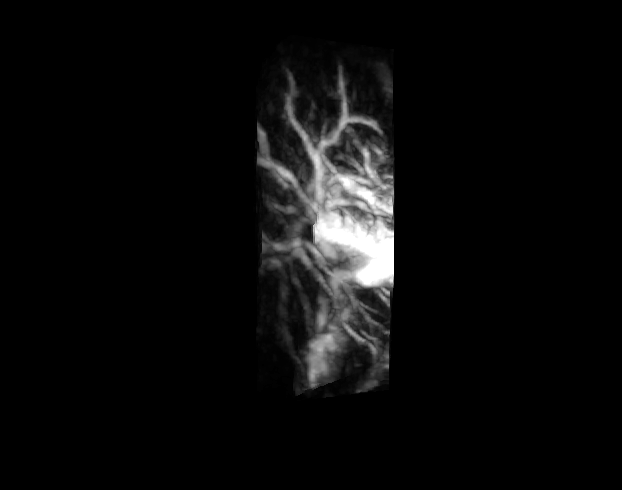

[12 of 16 positions shown; findings below may reference images not displayed]

FINDINGS: Lower chest: No acute findings.  Cardiomegaly.

Hepatobiliary: Hepatomegaly, maximum coronal span 21.7 cm. No mass
or other parenchymal abnormality identified. Status post
cholecystectomy. Multiple stacked calculi within the central common
bile duct, at least 5 or 6, measuring up to 0.5 cm (series 3, image
14). Moderate intrahepatic biliary ductal dilatation.

Pancreas: No mass, inflammatory changes, or other parenchymal
abnormality identified.No pancreatic ductal dilatation.

Spleen:  Within normal limits in size and appearance.

Adrenals/Urinary Tract: Normal adrenal glands. No renal masses or
suspicious contrast enhancement identified. No evidence of
hydronephrosis.

Stomach/Bowel: Visualized portions within the abdomen are
unremarkable.

Vascular/Lymphatic: No pathologically enlarged lymph nodes
identified. No abdominal aortic aneurysm demonstrated.

Other:  None.

Musculoskeletal: No suspicious osseous lesions identified.
IMPRESSION: 1. Multiple stacked calculi within the central common bile duct,
measuring up to 0.5 cm. Moderate intrahepatic biliary ductal
dilatation.
2. Status post cholecystectomy.
3. Hepatomegaly.

## 2022-04-15 IMAGING — CT CT ABD-PELV W/ CM
2 of 5 series · 15 of 46 positions shown, 17 images · IV contrast (APPLIED)
Comparison: [DATE]

CLINICAL DATA: Suspected biliary obstruction.  Suprapubic pain.

EXAM:
CT ABDOMEN AND PELVIS WITH CONTRAST
TECHNIQUE: Multidetector CT imaging of the abdomen and pelvis was performed
using the standard protocol following bolus administration of
intravenous contrast.

[Series 2: abdomen 5.0 · axial · 0.82mm/px · z∈[-1091,-686]mm · 12 of 97 slices shown, 14 images]
[im 8/97  soft-tissue]
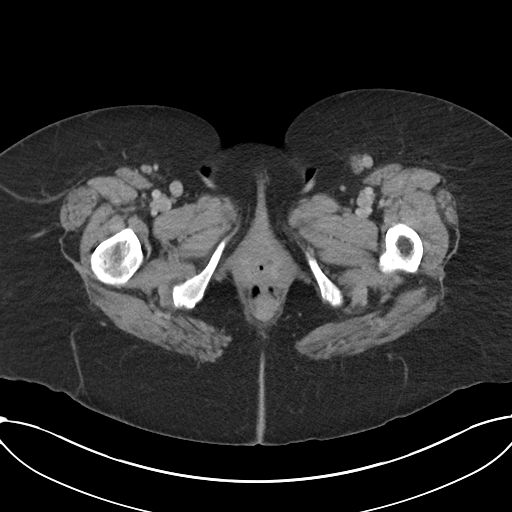
[im 8/97  bone]
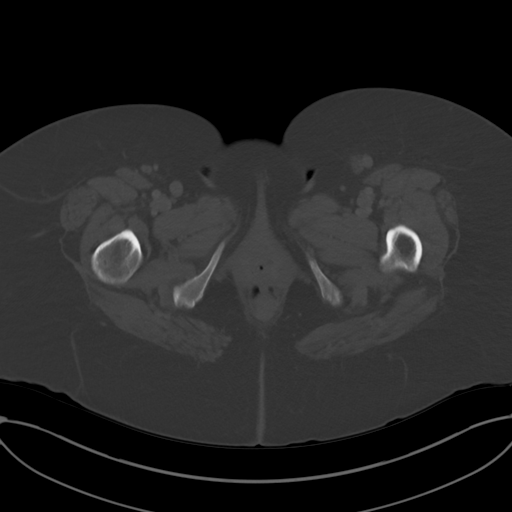
[im 15/97  soft-tissue]
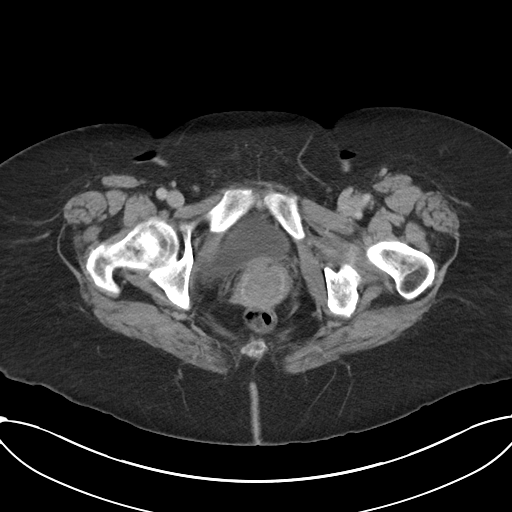
[im 23/97  soft-tissue]
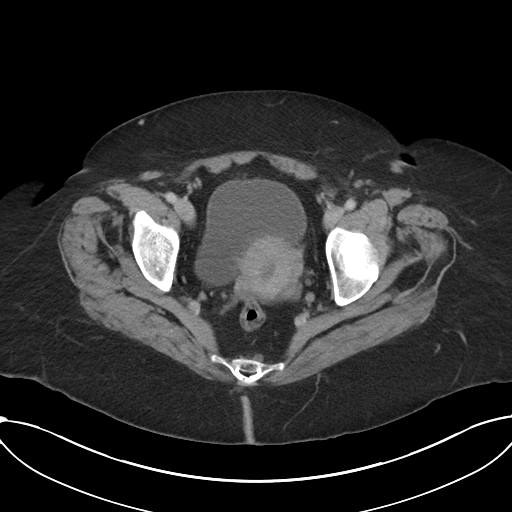
[im 30/97  soft-tissue]
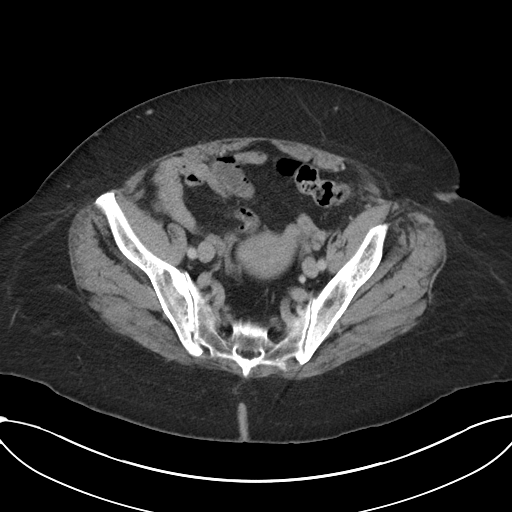
[im 37/97  soft-tissue]
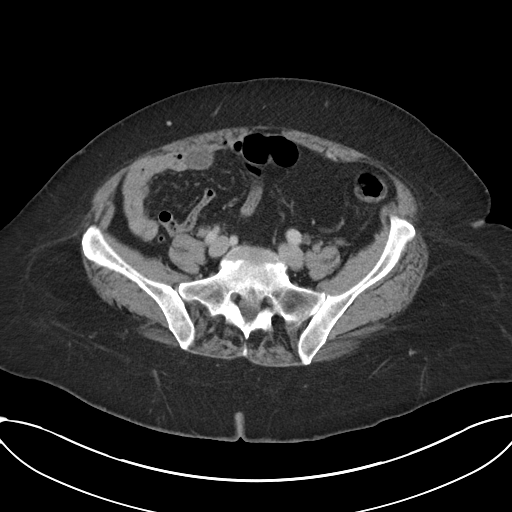
[im 45/97  soft-tissue]
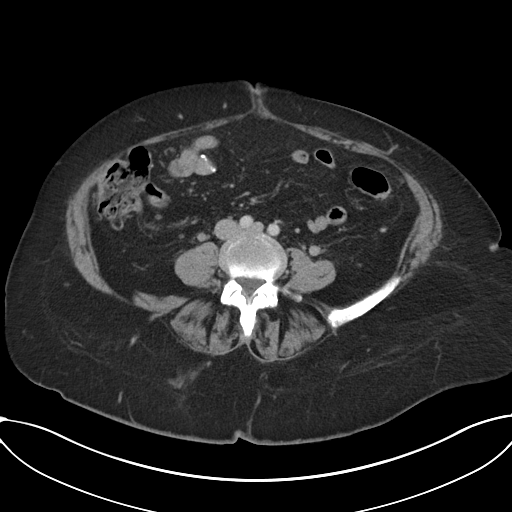
[im 52/97  soft-tissue]
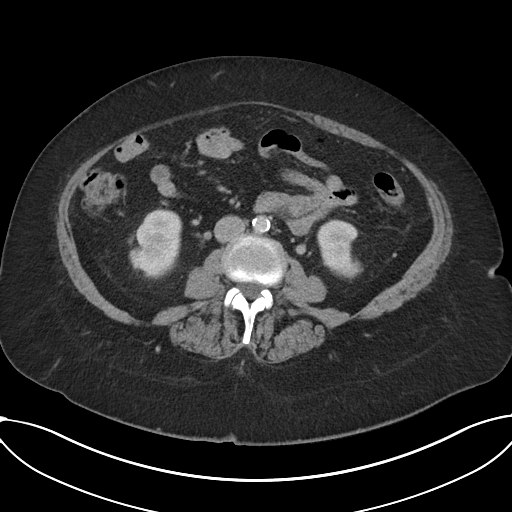
[im 60/97  soft-tissue]
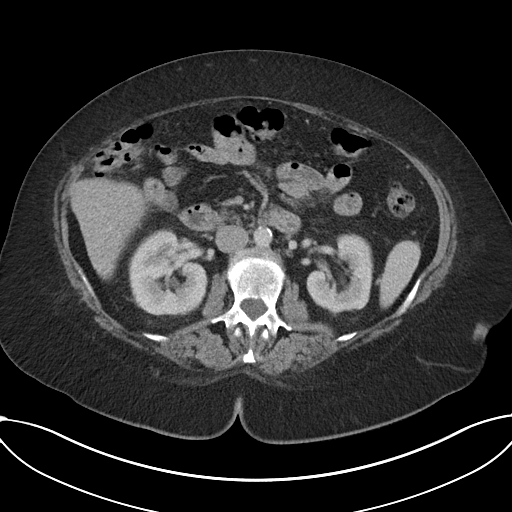
[im 67/97  soft-tissue]
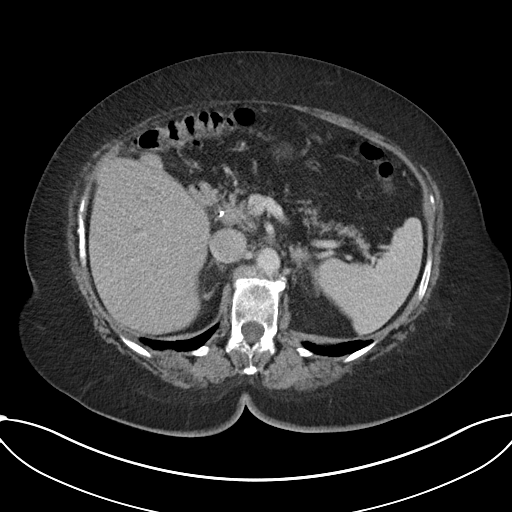
[im 67/97  bone]
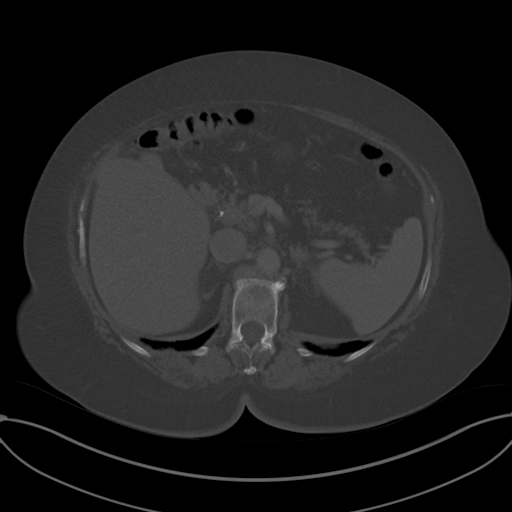
[im 74/97  soft-tissue]
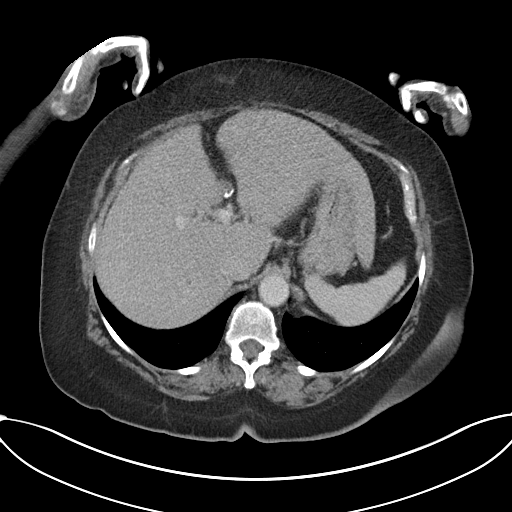
[im 82/97  soft-tissue]
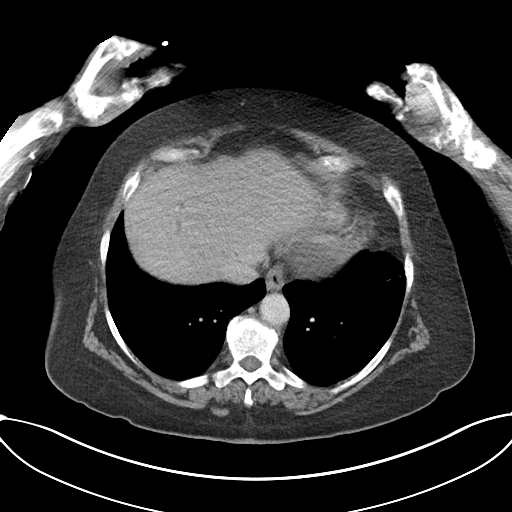
[im 89/97  soft-tissue]
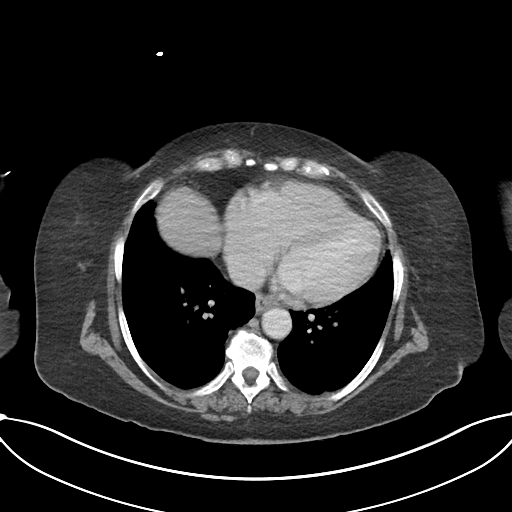

[Series 5: abdomen 3.0 mpr cor · coronal · 0.86mm/px · 3 of 112 slices shown]
[im 38/112  soft-tissue]
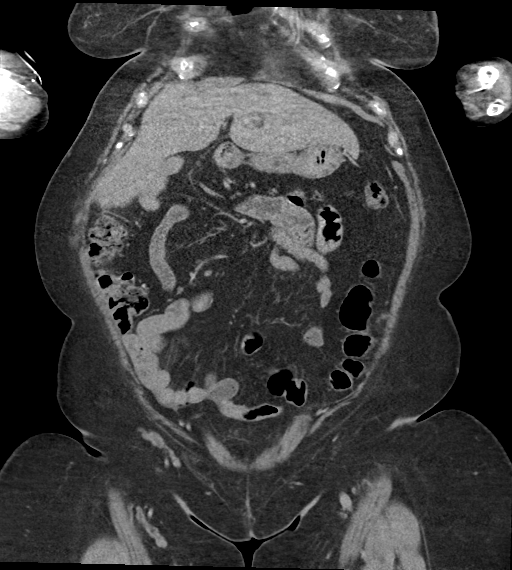
[im 50/112  soft-tissue]
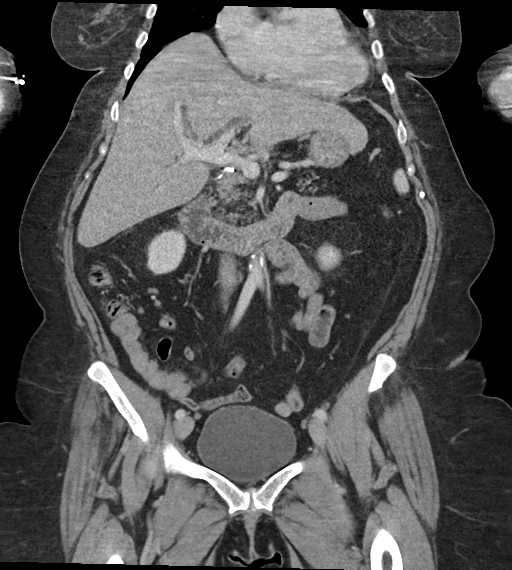
[im 62/112  soft-tissue]
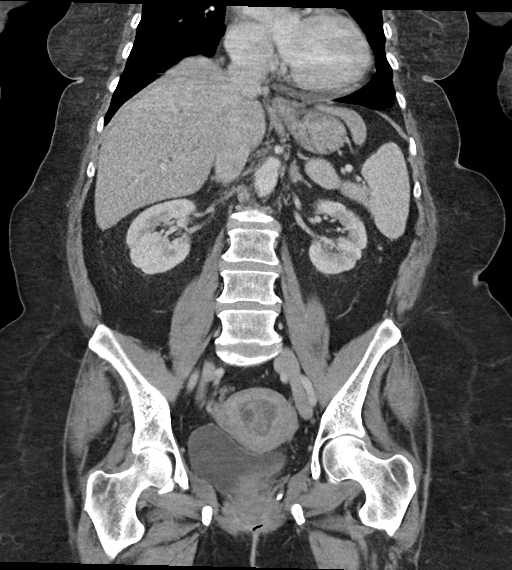

[15 of 46 positions shown; findings below may reference images not displayed]

RADIATION DOSE REDUCTION: This exam was performed according to the
departmental dose-optimization program which includes automated
exposure control, adjustment of the mA and/or kV according to
patient size and/or use of iterative reconstruction technique.

CONTRAST:  100mL OMNIPAQUE IOHEXOL 300 MG/ML  SOLN
FINDINGS: Lower chest: No acute abnormality.

Hepatobiliary: No focal liver abnormality is seen. Status post
cholecystectomy. No biliary dilatation.

Pancreas: Fatty replaced.

Spleen: Normal in size without focal abnormality.

Adrenals/Urinary Tract: Adrenal glands are unremarkable. Kidneys are
normal, without renal calculi, focal lesion, or hydronephrosis.
Bladder is unremarkable.

Stomach/Bowel: Stomach is within normal limits. Appendix appears
normal. No evidence of bowel wall thickening, distention, or
inflammatory changes.

Vascular/Lymphatic: Aortic atherosclerosis. No enlarged abdominal or
pelvic lymph nodes.

Reproductive: Distended heterogeneous endometrium measures 2.7 cm
transversely. No adnexal masses seen.

Other: No abdominal wall hernia or abnormality. No abdominopelvic
ascites.

Musculoskeletal: Mild posterior facet arthropathy of the lower
lumbosacral spine.
IMPRESSION: 1. No evidence of biliary obstruction.
2. Distended heterogeneous endometrium measures 2.7 cm in thickness.
Clinical correlation is recommended. Consider tissue sampling.

Aortic Atherosclerosis ([6I]-[6I]).

## 2022-04-15 IMAGING — MR MR ABDOMEN WO/W CM MRCP
20 of 21 series · 45 of 48 positions shown · IV contrast (gadavist)
Comparison: Same day CT abdomen pelvis, [DATE]

CLINICAL DATA: Biliary obstruction suspected

EXAM:
MRI ABDOMEN WITHOUT AND WITH CONTRAST (INCLUDING MRCP)
TECHNIQUE: Multiplanar multisequence MR imaging of the abdomen was performed
both before and after the administration of intravenous contrast.
Heavily T2-weighted images of the biliary and pancreatic ducts were
obtained, and three-dimensional MRCP images were rendered by post
processing.
CONTRAST:  10mL GADAVIST GADOBUTROL 1 MMOL/ML IV SOLN

[Series 3: T2 · coronal · 6.0mm · 1.25mm/px · 1 of 32 slices shown (1 of 2)]
[im 1/32]
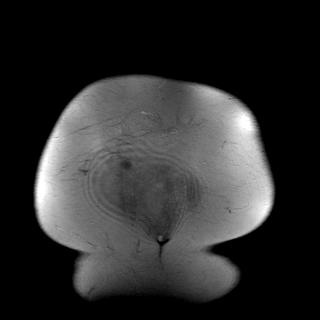

[Series 4: T2 · axial · 6.0mm · 1.19mm/px · 1 of 34 slices shown (2 of 2)]
[im 1/34]
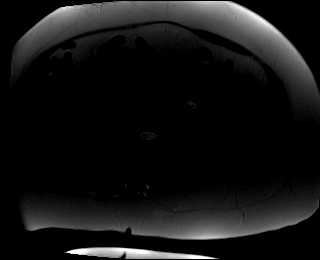

[Series 5: T1 · axial · 3.0mm · 1.19mm/px · z∈[-90,+141]mm · 2 of 78 slices shown (1 of 2)]
[im 1/78]
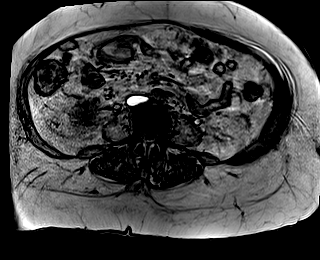
[im 78/78]
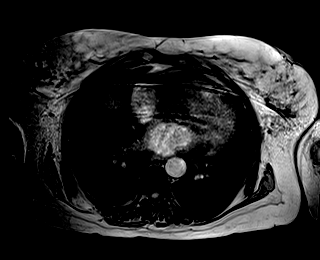

[Series 6: T1 · axial · 3.0mm · 1.19mm/px · z∈[-90,+141]mm · 2 of 78 slices shown (2 of 2)]
[im 1/78]
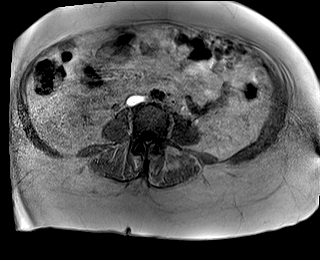
[im 78/78]
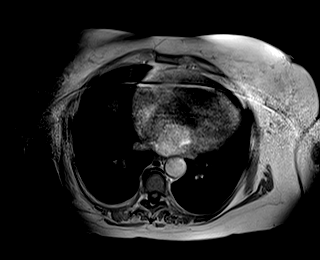

[Series 9: T2 fat-sat · axial · 6.0mm · 1.19mm/px · 1 of 34 slices shown]
[im 1/34]
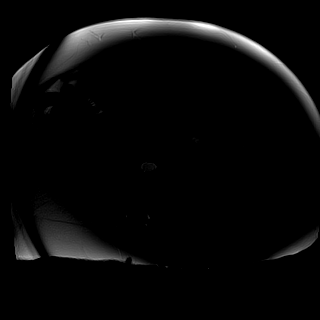

[Series 10: ax dwi_tracew · axial · 6.0mm · 1.42mm/px · z∈[-93,+145]mm · 4 of 102 slices shown]
[im 1/102]
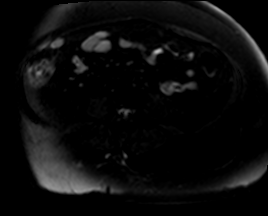
[im 34/102]
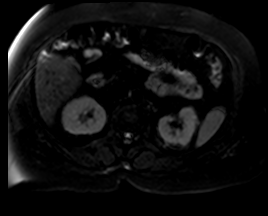
[im 68/102]
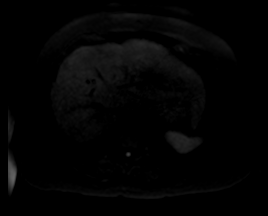
[im 102/102]
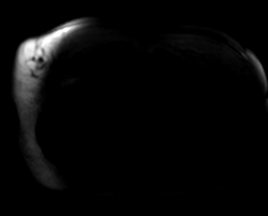

[Series 11: ax dwi_adc · axial · 6.0mm · 1.42mm/px · 1 of 34 slices shown]
[im 1/34]
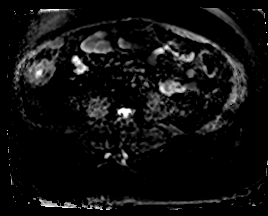

[Series 15: MRCP · coronal · 3.0mm · 1.12mm/px · 1 of 17 slices shown (1 of 2)]
[im 1/17]
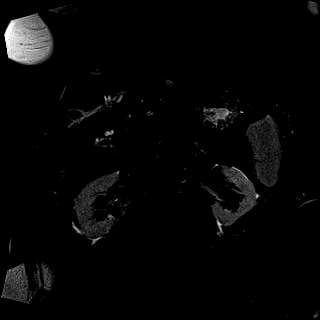

[Series 16: radials · coronal · 50.0mm · 0.78mm/px · 1 of 5 slices shown]
[im 1/5]
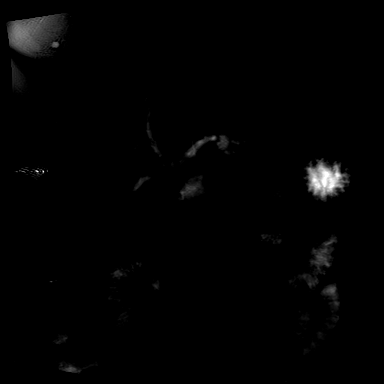

[Series 17: T1 dynamic fat-sat · axial · non-contrast · 3.0mm · 1.19mm/px · z∈[-87,+138]mm · 3 of 76 slices shown (1 of 5)]
[im 1/76]
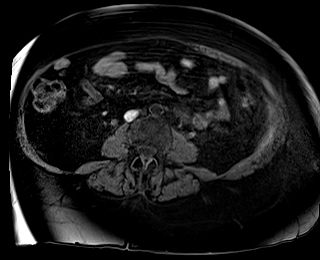
[im 38/76]
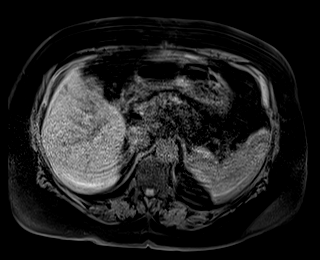
[im 76/76]
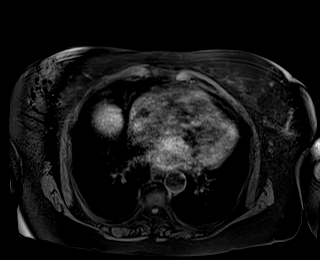

[Series 18: T1 dynamic fat-sat post-contrast · axial · 3.0mm · 1.19mm/px · z∈[-87,+138]mm · 3 of 76 slices shown (1 of 4)]
[im 1/76]
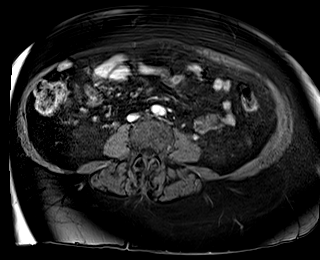
[im 38/76]
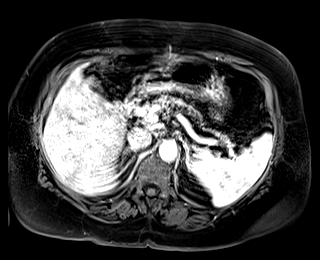
[im 76/76]
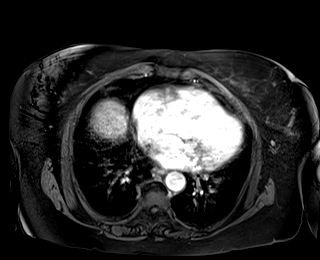

[Series 19: T1 dynamic fat-sat · axial · 3.0mm · 1.19mm/px · z∈[-87,+138]mm · 3 of 76 slices shown (2 of 5)]
[im 1/76]
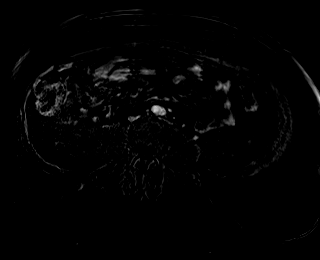
[im 38/76]
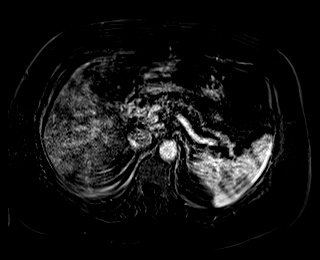
[im 76/76]
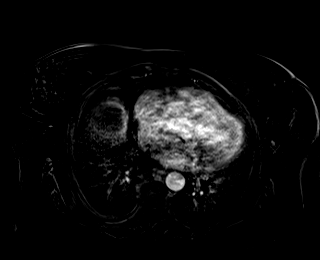

[Series 20: T1 dynamic fat-sat post-contrast · axial · 3.0mm · 1.19mm/px · z∈[-87,+138]mm · 3 of 76 slices shown (2 of 4)]
[im 1/76]
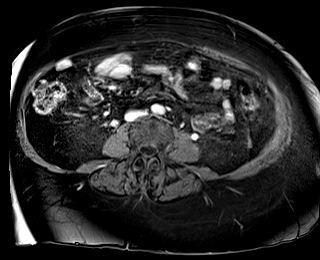
[im 38/76]
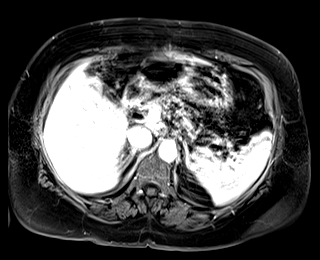
[im 76/76]
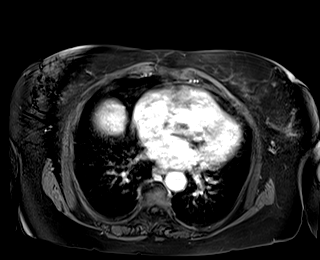

[Series 21: T1 dynamic fat-sat · axial · 3.0mm · 1.19mm/px · z∈[-87,+138]mm · 3 of 76 slices shown (3 of 5)]
[im 1/76]
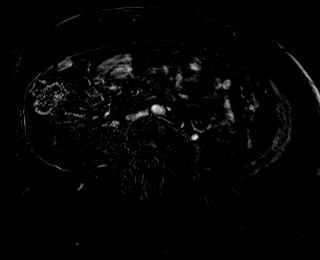
[im 38/76]
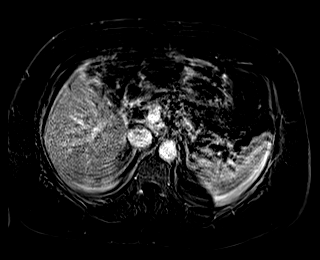
[im 76/76]
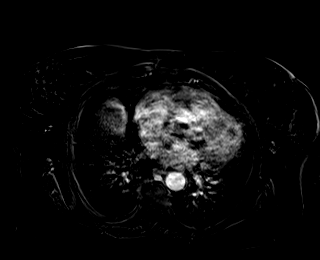

[Series 22: T1 dynamic fat-sat post-contrast · axial · 3.0mm · 1.19mm/px · z∈[-87,+138]mm · 3 of 76 slices shown (3 of 4)]
[im 1/76]
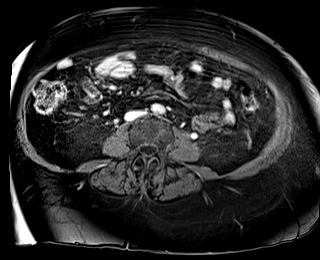
[im 38/76]
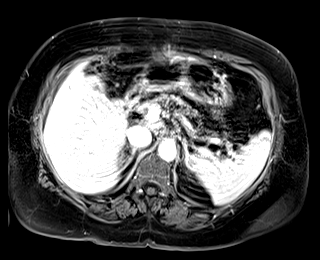
[im 76/76]
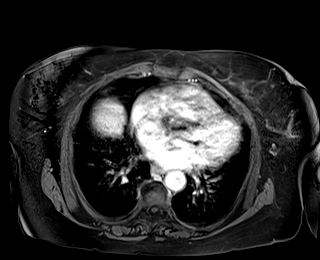

[Series 23: T1 dynamic fat-sat · axial · 3.0mm · 1.19mm/px · z∈[-87,+138]mm · 3 of 76 slices shown (4 of 5)]
[im 1/76]
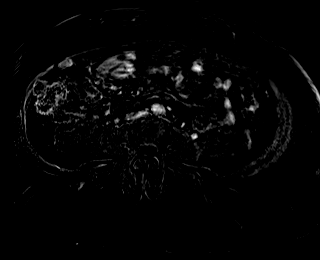
[im 38/76]
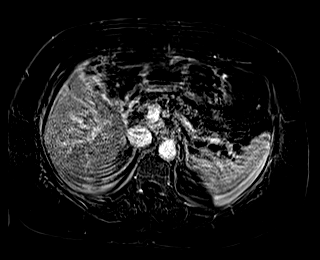
[im 76/76]
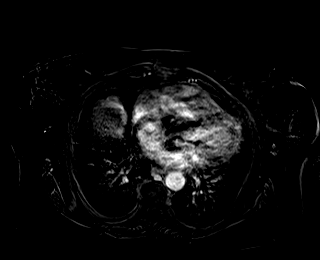

[Series 24: T1 dynamic post-contrast · coronal · 3.0mm · 1.31mm/px · 3 of 72 slices shown]
[im 1/72]
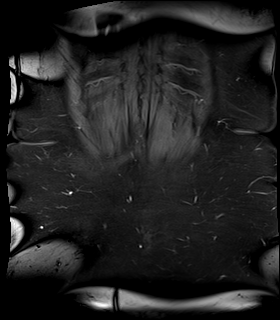
[im 36/72]
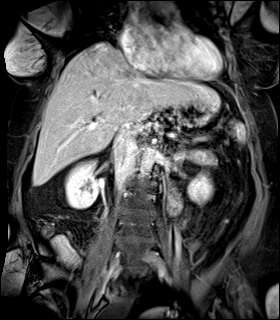
[im 72/72]
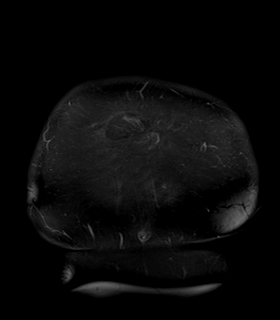

[Series 25: T1 dynamic fat-sat post-contrast · axial · 3.0mm · 1.19mm/px · z∈[-87,+138]mm · 3 of 76 slices shown (4 of 4)]
[im 1/76]
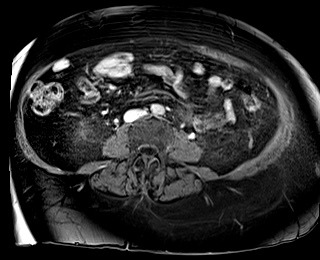
[im 38/76]
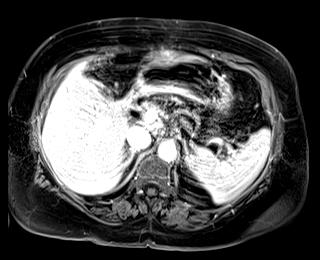
[im 76/76]
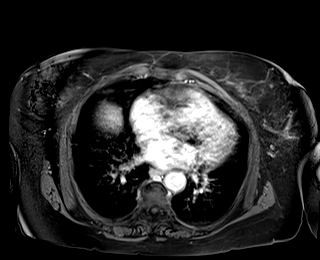

[Series 26: T1 dynamic fat-sat · axial · 3.0mm · 1.19mm/px · z∈[-87,+138]mm · 3 of 76 slices shown (5 of 5)]
[im 1/76]
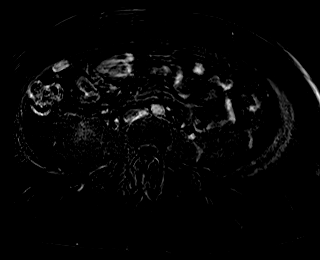
[im 38/76]
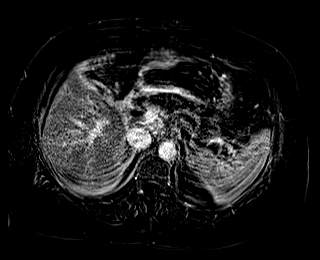
[im 76/76]
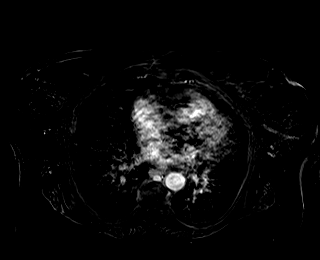

[Series 1034: MRCP · sagittal · 479.6mm · 0.49mm/px · 1 of 6 slices shown (2 of 2)]
[im 1/6]
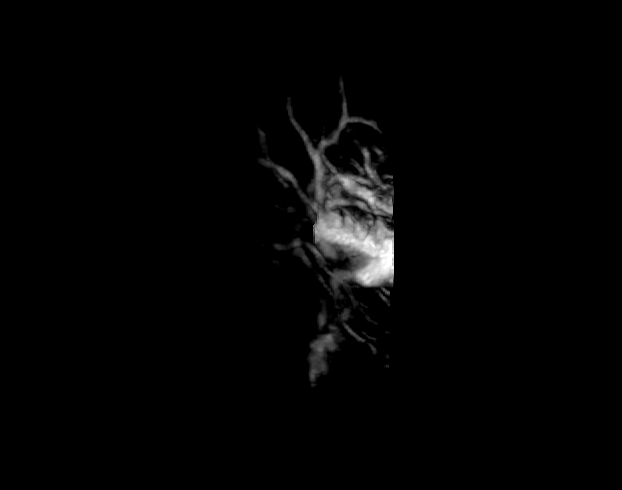

[45 of 48 positions shown; findings below may reference images not displayed]

FINDINGS: Lower chest: No acute findings.  Cardiomegaly.

Hepatobiliary: Hepatomegaly, maximum coronal span 21.7 cm. No mass
or other parenchymal abnormality identified. Status post
cholecystectomy. Multiple stacked calculi within the central common
bile duct, at least 5 or 6, measuring up to 0.5 cm (series 3, image
14). Moderate intrahepatic biliary ductal dilatation.

Pancreas: No mass, inflammatory changes, or other parenchymal
abnormality identified.No pancreatic ductal dilatation.

Spleen:  Within normal limits in size and appearance.

Adrenals/Urinary Tract: Normal adrenal glands. No renal masses or
suspicious contrast enhancement identified. No evidence of
hydronephrosis.

Stomach/Bowel: Visualized portions within the abdomen are
unremarkable.

Vascular/Lymphatic: No pathologically enlarged lymph nodes
identified. No abdominal aortic aneurysm demonstrated.

Other:  None.

Musculoskeletal: No suspicious osseous lesions identified.
IMPRESSION: 1. Multiple stacked calculi within the central common bile duct,
measuring up to 0.5 cm. Moderate intrahepatic biliary ductal
dilatation.
2. Status post cholecystectomy.
3. Hepatomegaly.

## 2022-04-15 MED ORDER — IOHEXOL 300 MG/ML  SOLN
100.0000 mL | Freq: Once | INTRAMUSCULAR | Status: AC | PRN
  Administered 2022-04-15: 100 mL via INTRAVENOUS

## 2022-04-15 MED ORDER — POTASSIUM CHLORIDE IN NACL 40-0.9 MEQ/L-% IV SOLN
INTRAVENOUS | Status: DC
  Filled 2022-04-15 (×12): qty 1000

## 2022-04-15 MED ORDER — SODIUM CHLORIDE 0.9 % IV BOLUS
1000.0000 mL | Freq: Once | INTRAVENOUS | Status: AC
  Administered 2022-04-15: 1000 mL via INTRAVENOUS

## 2022-04-15 MED ORDER — POTASSIUM CHLORIDE 20 MEQ PO PACK
40.0000 meq | PACK | Freq: Once | ORAL | Status: AC
  Administered 2022-04-15: 40 meq via ORAL
  Filled 2022-04-15: qty 2

## 2022-04-15 MED ORDER — ONDANSETRON 8 MG PO TBDP: 8.0000 mg | ORAL_TABLET | Freq: Once | ORAL | Status: DC

## 2022-04-15 MED ORDER — ONDANSETRON 8 MG PO TBDP
8.0000 mg | ORAL_TABLET | Freq: Once | ORAL | Status: AC
  Administered 2022-04-15: 8 mg via ORAL

## 2022-04-15 MED ORDER — ACETAMINOPHEN 325 MG PO TABS: 650.0000 mg | ORAL_TABLET | Freq: Four times a day (QID) | ORAL | Status: DC | PRN

## 2022-04-15 MED ORDER — GADOBUTROL 1 MMOL/ML IV SOLN
10.0000 mL | Freq: Once | INTRAVENOUS | Status: AC | PRN
  Administered 2022-04-15: 10 mL via INTRAVENOUS

## 2022-04-15 MED ORDER — ACETAMINOPHEN 650 MG RE SUPP: 650.0000 mg | Freq: Four times a day (QID) | RECTAL | Status: DC | PRN

## 2022-04-15 MED ORDER — LORAZEPAM 2 MG/ML IJ SOLN
1.0000 mg | INTRAMUSCULAR | Status: DC | PRN
  Administered 2022-04-15: 1 mg via INTRAVENOUS
  Filled 2022-04-15: qty 1

## 2022-04-15 MED ORDER — MAGNESIUM HYDROXIDE 400 MG/5ML PO SUSP: 30.0000 mL | Freq: Every day | ORAL | Status: DC | PRN

## 2022-04-15 MED ORDER — POTASSIUM CHLORIDE IN NACL 20-0.9 MEQ/L-% IV SOLN: INTRAVENOUS | Status: DC

## 2022-04-15 MED ORDER — TRAZODONE HCL 50 MG PO TABS: 25.0000 mg | ORAL_TABLET | Freq: Every evening | ORAL | Status: DC | PRN

## 2022-04-15 MED ORDER — ONDANSETRON HCL 4 MG/2ML IJ SOLN: 4.0000 mg | Freq: Four times a day (QID) | INTRAMUSCULAR | Status: DC | PRN

## 2022-04-15 MED ORDER — ENOXAPARIN SODIUM 40 MG/0.4ML IJ SOSY: 40.0000 mg | PREFILLED_SYRINGE | INTRAMUSCULAR | Status: DC

## 2022-04-15 MED ORDER — ONDANSETRON HCL 4 MG PO TABS: 4.0000 mg | ORAL_TABLET | Freq: Four times a day (QID) | ORAL | Status: DC | PRN

## 2022-04-15 NOTE — ED Triage Notes (Signed)
Patient c/o mid abdominal pain for the past 2 days.  Patient reports nausea.  Patient thinks that she might have had blood in her urine.  Patient reports diarrhea and vomiting that started on Tuesday must states that has resolved.  Patient denies fevers. Patient states that she has been unable to give [place] a urine specimen at this time.  ?

## 2022-04-15 NOTE — Assessment & Plan Note (Addendum)
Resolved ?Follow BMP ?

## 2022-04-15 NOTE — ED Provider Notes (Signed)
?[facility] ? ? ? ?CSN: [id number] ?Arrival date & time: 04/15/22  [id number] ? ? ?  ? ?History   ?Chief Complaint ?Chief Complaint  ?Patient presents with  ? Abdominal Pain  ? ? ?HPI ?[name] is a 66 y.o. female who presents with suprapubuc pain, fatigue and been sleeping x 2 days. Had been chilling  and had a HA.Denies fever.  ?Had diarrhea and vomited 5 days ago, but this has resolved.  ?She noticed dark urine like tea yesterday. Denies hx of renal stones. Feels very nauseous. Is very fatigued.  ? ? ? ? ?Past Medical History:  ?Diagnosis Date  ? Injury, blood vessel   ? ? ?Patient Active Problem List  ? Diagnosis Date Noted  ? Varicose veins of left lower extremity with complications [id number]  ? Venous ulcer of left leg ([place]) 02/09/2016  ? ? ?Past Surgical History:  ?Procedure Laterality Date  ? CHOLECYSTECTOMY    ? ENDOVENOUS ABLATION SAPHENOUS VEIN W/ LASER Left 11/12/2017  ? endovenous laser ablation left greater saphenous vein by Tinnie Gens MD   ? VASCULAR SURGERY    ? ? ?OB History   ?No obstetric history on file. ?  ? ? ? ?Home Medications   ? ?Prior to Admission medications   ?Not on File  ? ? ?Family History ?Family History  ?Problem Relation Age of Onset  ? Heart disease Mother   ? Diabetes Mother   ? Cancer Father   ? Heart disease Father   ? Hypertension Father   ? ? ?Social History ?Social History  ? ?Tobacco Use  ? Smoking status: Never  ? Smokeless tobacco: Never  ?Vaping Use  ? Vaping Use: Never used  ?Substance Use Topics  ? Alcohol use: No  ? Drug use: No  ? ? ? ?Allergies   ?Codeine, Septra ds [sulfamethoxazole-trimethoprim], Tramadol, and Sulfa antibiotics ? ? ?Review of Systems ?Review of Systems  ?Constitutional:  Positive for activity change, appetite change, chills and fatigue. Negative for fever.  ?HENT:  Negative for congestion, postnasal drip, rhinorrhea, sore throat and trouble swallowing.   ?Respiratory:  Negative for cough.   ?Gastrointestinal:  Positive for  abdominal pain, blood in stool and nausea. Negative for diarrhea and vomiting.  ?Genitourinary:  Positive for frequency, hematuria and urgency. Negative for dysuria.  ?Musculoskeletal:  Negative for myalgias.  ?Skin:  Negative for rash.  ?Neurological:  Positive for headaches.  ? ? ?Physical Exam ?Triage Vital Signs ?ED Triage Vitals  ?Enc Vitals Group  ?   BP 04/15/22 1120 (!) 148/63  ?   Pulse Rate 04/15/22 1120 61  ?   Resp 04/15/22 1120 14  ?   Temp 04/15/22 1120 98.4 ?F (36.9 ?C)  ?   Temp Source 04/15/22 1120 Oral  ?   SpO2 04/15/22 1120 100 %  ?   Weight 04/15/22 1118 260 lb (117.9 kg)  ?   Height 04/15/22 1118 5' (1.524 m)  ?   Head Circumference --   ?   Peak Flow --   ?   Pain Score 04/15/22 1118 5  ?   Pain Loc --   ?   Pain Edu? --   ?   Excl. in [place]? --   ? ?No data found. ? ?Updated Vital Signs ?BP (!) 148/63 (BP Location: Left Arm)   Pulse 61   Temp 98.4 ?F (36.9 ?C) (Oral)   Resp 14   Ht 5' (1.524 m)   Wt 260 lb (  117.9 kg)   SpO2 100%   BMI 50.78 kg/m?  ? ?Visual Acuity ?Right Eye Distance:   ?Left Eye Distance:   ?Bilateral Distance:   ? ?Right Eye Near:   ?Left Eye Near:    ?Bilateral Near:    ? ?Physical Exam ?Constitutional:   ?   Appearance: She is obese. She is ill-appearing.  ?HENT:  ?   Right Ear: External ear normal.  ?   Left Ear: External ear normal.  ?Eyes:  ?   General: Scleral icterus present.  ?   Extraocular Movements: Extraocular movements intact.  ?   Pupils: Pupils are equal, round, and reactive to light.  ?   Comments: Icterus scleras   ?Pulmonary:  ?   Effort: Pulmonary effort is normal.  ?Abdominal:  ?   General: Abdomen is protuberant. A surgical scar is present. Bowel sounds are decreased.  ?   Palpations: Abdomen is soft. There is no hepatomegaly, splenomegaly or mass.  ?   Tenderness: There is abdominal tenderness in the right upper quadrant and right lower quadrant. There is guarding. There is no right CVA tenderness, left CVA tenderness or rebound.  ?   Comments:  Tender in R mid abdomen  ?Musculoskeletal:     ?   General: Normal range of motion.  ?   Cervical back: Neck supple.  ?Skin: ?   General: Skin is warm and dry.  ?   Coloration: Skin is not jaundiced.  ?   Findings: No erythema.  ?Neurological:  ?   Mental Status: She is alert and oriented to person, place, and time.  ?   Gait: Gait normal.  ?Psychiatric:     ?   Mood and Affect: Mood normal.     ?   Behavior: Behavior normal.     ?   Thought Content: Thought content normal.     ?   Judgment: Judgment normal.  ? ? ? ?UC Treatments / Results  ?Labs ?(all labs ordered are listed, but only abnormal results are displayed) ?Labs Reviewed  ?URINALYSIS, ROUTINE W REFLEX MICROSCOPIC - Abnormal; Notable for the following components:  ?    Result Value  ? Color, Urine BROWN (*)   ? APPearance HAZY (*)   ? Hgb urine dipstick TRACE (*)   ? Bilirubin Urine LARGE (*)   ? Ketones, ur TRACE (*)   ? Protein, ur 100 (*)   ? Leukocytes,Ua SMALL (*)   ? All other components within normal limits  ?URINALYSIS, MICROSCOPIC (REFLEX) - Abnormal; Notable for the following components:  ? Bacteria, UA MANY (*)   ? All other components within normal limits  ? ? ?EKG ? ? ?Radiology ?No results found. ? ?Procedures ?Procedures (including critical care time) ? ?Medications Ordered in UC ?Medications  ?ondansetron (ZOFRAN-ODT) disintegrating tablet 8 mg (8 mg Oral Given 04/15/22 1231)  ? ? ?Initial Impression / Assessment and Plan / UC Course  ?I have reviewed the triage vital signs and the nursing notes. ? ?Pertinent labs  results that were available during my care of the patient were reviewed by me and considered in my medical decision making (see chart for details). ? ?She was sent to ER for further work up.  ? ?Final Clinical Impressions(s) / UC Diagnoses  ? ?Final diagnoses:  ?Right lower quadrant abdominal pain  ?Bilirubinuria  ?Icterus  ? ? ? ?Discharge Instructions   ? ?  ?Your urine has large amount of bilirubine which is coming from your  liver. ?You need to go to ER and have more test done.  ? ? ? ? ?ED Prescriptions   ?None ?  ? ?PDMP not reviewed this encounter. ?  ?Shelby Mattocks, PA-C ?04/15/22 1425 ? ?

## 2022-04-15 NOTE — Assessment & Plan Note (Addendum)
D/t Obstructive choledocholithiasis. ?Follow LFTs outpatient but these have been trending down  ?

## 2022-04-15 NOTE — ED Notes (Signed)
Patient AOX4. Resp even, unlabored on RA. Denies pain. Incontinent of small amount of liquid stool in brief. Assisted up to toilet, continent of small BM in toilet and assisted back to bed. No distress noted at this time.  ?

## 2022-04-15 NOTE — ED Notes (Signed)
Patient is being discharged from the Urgent Care and sent to the [facility] Emergency Department via private vehicle . Per Beatrix Fetters, PA, patient is in need of higher level of care due to needing further testing. Patient is aware and verbalizes understanding of plan of care.  ?Vitals:  ? 04/15/22 1120  ?BP: (!) 148/63  ?Pulse: 61  ?Resp: 14  ?Temp: 98.4 ?F (36.9 ?C)  ?SpO2: 100%  ?  ?

## 2022-04-15 NOTE — H&P (Signed)
?  ?  ?[facility] ? ? ?PATIENT NAME: [name]   ? ?MR#:  [id number] ? ?DATE OF BIRTH:  [birth year] ? ?DATE OF ADMISSION:  04/15/2022 ? ?PRIMARY CARE PHYSICIAN: Pcp, No  ? ?Patient is coming from: Home ? ?REQUESTING/REFERRING PHYSICIAN: Chesley Noon, MD ? ?CHIEF COMPLAINT:  ? ?Chief Complaint  ?Patient presents with  ?? Abdominal Pain  ? ? ?HISTORY OF PRESENT ILLNESS:  ?[name] is a 66 y.o. Caucasian female with medical history significant for previous cholecystectomy in 2005 and biliary stent for biliary duct injury, presented to the emergency room with acute onset of right upper quadrant abdominal pain and generalized weakness.  She initially developed nausea and vomiting as well as diarrhea 5 days ago that lasted about 24 hours.  After that she felt significantly weak that she slid out of bed last night without head injuries or syncope.  She admitted to persistent nausea with diminished appetite but no current vomiting or diarrhea.  No dysuria, oliguria, hematuria or flank pain.  She denies any fever or chills.  The patient was seen today in urgent care and was referred to the ED for elevated bilirubin in her urinalysis.  She denies any chest pain or dyspnea or cough or wheezing.  No dysuria, oliguria or hematuria or flank pain. ? ?ED Course: Upon presentation to the emergency room, BP was 148/63 with otherwise normal vital signs.  Labs revealed mild hyponatremia 133 and hypokalemia of 3.3.  Alkaline phosphatase was 300 and AST 196, ALT 506 and total bili of 7.6 with direct bili of 4.9.  CBC was within normal.  Urinalysis came back suspicious for UTI. ? ?Imaging: Abdominal pelvic CT scan showed distended heterogenous endometrium measuring 2.7 cm in thickness and no evidence of biliary obstruction. ?MRCP showed: ?1. Multiple stacked calculi within the central common bile duct, ?measuring up to 0.5 cm. Moderate intrahepatic biliary ductal ?dilatation. ?2. Status post cholecystectomy. ?3.  Hepatomegaly. ? ?The patient was given 1 L bolus of IV normal saline and 1 mg of IV Ativan.  She will be admitted to a medical bed for further evaluation and management. ?PAST MEDICAL HISTORY:  ? ?Past Medical History:  ?Diagnosis Date  ?? Injury, blood vessel   ? ? ?PAST SURGICAL HISTORY:  ? ?Past Surgical History:  ?Procedure Laterality Date  ?? CHOLECYSTECTOMY    ?? ENDOVENOUS ABLATION SAPHENOUS VEIN W/ LASER Left 11/12/2017  ? endovenous laser ablation left greater saphenous vein by Josephina Gip MD   ?? VASCULAR SURGERY    ? ? ?SOCIAL HISTORY:  ? ?Social History  ? ?Tobacco Use  ?? Smoking status: Never  ?? Smokeless tobacco: Never  ?Substance Use Topics  ?? Alcohol use: No  ? ? ?FAMILY HISTORY:  ? ?Family History  ?Problem Relation Age of Onset  ?? Heart disease Mother   ?? Diabetes Mother   ?? Cancer Father   ?? Heart disease Father   ?? Hypertension Father   ? ? ?DRUG ALLERGIES:  ? ?Allergies  ?Allergen Reactions  ?? Codeine   ?  Other reaction(s): Other (See Comments) ?Stomach burning  ?? Septra Ds [Sulfamethoxazole-Trimethoprim] Other (See Comments)  ?  Nausea   And stomach burning  ?? Tramadol Other (See Comments)  ?  Makes me feel loopy    ?? Sulfa Antibiotics   ?  Other reaction(s): Unknown ?As a child  ? ? ?REVIEW OF SYSTEMS:  ? ?ROS ?As per history of present illness. All pertinent systems were reviewed above. Constitutional,  HEENT, cardiovascular, respiratory, GI, GU, musculoskeletal, neuro, psychiatric, endocrine, integumentary and hematologic systems were reviewed and are otherwise negative/unremarkable except for positive findings mentioned above in the HPI. ? ? ?MEDICATIONS AT HOME:  ? ?Prior to Admission medications   ?Not on File  ? ?  ? ?VITAL SIGNS:  ?Blood pressure 134/64, pulse 67, temperature 98.4 ?F (36.9 ?C), resp. rate 16, SpO2 100 %. ? ?PHYSICAL EXAMINATION:  ?Physical Exam ? ?GENERAL:  66 y.o.-year-old Caucasian female patient lying in the bed with no acute distress.  ?EYES: Pupils  equal, round, reactive to light and accommodation.  Positive scleral icterus. Extraocular muscles intact.  ?HEENT: Head atraumatic, normocephalic. Oropharynx and nasopharynx clear.  ?NECK:  Supple, no jugular venous distention. No thyroid enlargement, no tenderness.  ?LUNGS: Normal breath sounds bilaterally, no wheezing, rales,rhonchi or crepitation. No use of accessory muscles of respiration.  ?CARDIOVASCULAR: Regular rate and rhythm, S1, S2 normal. No murmurs, rubs, or gallops.  ?ABDOMEN: Soft, nondistended, with right upper quadrant tenderness without rebound tenderness guarding or rigidity.. Bowel sounds present. No organomegaly or mass.  ?EXTREMITIES: No pedal edema, cyanosis, or clubbing.  ?NEUROLOGIC: Cranial nerves II through XII are intact. Muscle strength 5/5 in all extremities. Sensation intact. Gait not checked.  ?PSYCHIATRIC: The patient is alert and oriented x 3.  Normal affect and good eye contact. ?SKIN: No obvious rash, lesion, or ulcer.  ? ?LABORATORY PANEL:  ? ?CBC ?Recent Labs  ?Lab 04/15/22 ?1339  ?WBC 9.9  ?HGB 14.6  ?HCT 43.5  ?PLT 234  ? ?------------------------------------------------------------------------------------------------------------------ ? ?Chemistries  ?Recent Labs  ?Lab 04/15/22 ?1339  ?NA 133*  ?K 3.3*  ?CL 99  ?CO2 27  ?GLUCOSE 117*  ?BUN 10  ?CREATININE 0.70  ?CALCIUM 9.1  ?AST 196*  ?ALT 506*  ?ALKPHOS 300*  ?BILITOT 7.6*  ? ?------------------------------------------------------------------------------------------------------------------ ? ?Cardiac Enzymes ?No results for input(s): TROPONINI in the last 168 hours. ?------------------------------------------------------------------------------------------------------------------ ? ?RADIOLOGY:  ?CT Abdomen Pelvis W Contrast ? ?Result Date: 04/15/2022 ?CLINICAL DATA:  Suspected biliary obstruction.  Suprapubic pain. EXAM: CT ABDOMEN AND PELVIS WITH CONTRAST TECHNIQUE: Multidetector CT imaging of the abdomen and pelvis was  performed using the standard protocol following bolus administration of intravenous contrast. RADIATION DOSE REDUCTION: This exam was performed according to the departmental dose-optimization program which includes automated exposure control, adjustment of the mA and/or kV according to patient size and/or use of iterative reconstruction technique. CONTRAST:  100mL OMNIPAQUE IOHEXOL 300 MG/ML  SOLN COMPARISON:  May 18, 2013 FINDINGS: Lower chest: No acute abnormality. Hepatobiliary: No focal liver abnormality is seen. Status post cholecystectomy. No biliary dilatation. Pancreas: Fatty replaced. Spleen: Normal in size without focal abnormality. Adrenals/Urinary Tract: Adrenal glands are unremarkable. Kidneys are normal, without renal calculi, focal lesion, or hydronephrosis. Bladder is unremarkable. Stomach/Bowel: Stomach is within normal limits. Appendix appears normal. No evidence of bowel wall thickening, distention, or inflammatory changes. Vascular/Lymphatic: Aortic atherosclerosis. No enlarged abdominal or pelvic lymph nodes. Reproductive: Distended heterogeneous endometrium measures 2.7 cm transversely. No adnexal masses seen. Other: No abdominal wall hernia or abnormality. No abdominopelvic ascites. Musculoskeletal: Mild posterior facet arthropathy of the lower lumbosacral spine. IMPRESSION: 1. No evidence of biliary obstruction. 2. Distended heterogeneous endometrium measures 2.7 cm in thickness. Clinical correlation is recommended. Consider tissue sampling. Aortic Atherosclerosis (ICD10-I70.0). Electronically Signed   By: Ted Mcalpineobrinka  Dimitrova M.D.   On: 04/15/2022 17:10  ? ?MR 3D Recon At Scanner ? ?Result Date: 04/15/2022 ?CLINICAL DATA:  Biliary obstruction suspected EXAM: MRI ABDOMEN WITHOUT AND WITH CONTRAST (INCLUDING MRCP) TECHNIQUE:  Multiplanar multisequence MR imaging of the abdomen was performed both before and after the administration of intravenous contrast. Heavily T2-weighted images of the biliary  and pancreatic ducts were obtained, and three-dimensional MRCP images were rendered by post processing. CONTRAST:  12mL GADAVIST GADOBUTROL 1 MMOL/ML IV SOLN COMPARISON:  Same day CT abdomen pelvis, 04/15/2022 F

## 2022-04-15 NOTE — Assessment & Plan Note (Addendum)
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  Internal/external biliary drain placement, 8.5 [place].  Stent was placed.   ?Clear liquids to advance as tolerated ?Drain care per IR - pt was educated today by [employer] PA ?

## 2022-04-15 NOTE — ED Provider Notes (Signed)
? ?[facility] ?Provider Note ? ? ? Event Date/Time  ? First MD Initiated Contact with Patient 04/15/22 1506   ?  (approximate) ? ? ?History  ? ?Chief Complaint ?Abdominal Pain ? ? ?HPI ? ?[name] is a 66 y.o. female with past medical history of cholecystectomy and biliary stent who presents to the ED complaining of abdominal pain and generalized weakness.  Patient reports that she first developed nausea, vomiting, and diarrhea about 5 days ago, which lasted for about 24 hours.  After the vomiting and diarrhea improved, she states she has been feeling extremely weak with difficulty getting around her home.  She had an episode last night where she felt so weak that she slid out of bed, denies hitting her head or losing consciousness.  She states she has been having occasional pain in the right upper quadrant of her abdomen, reports minimal pain at this time.  She has continued to feel nauseous with poor appetite, but denies any vomiting or changes in her bowel movements.  She has not had any hematuria, dysuria, fever, or flank pain.  She was initially evaluated at urgent care and referred to the ED due to bilirubin in her urine.  Patient reports that she had her gallbladder out in 2005, states that her bile duct was "nicked" at that time and she required placement of a biliary stent. ?  ? ? ?Physical Exam  ? ?Triage Vital Signs: ?ED Triage Vitals  ?Enc Vitals Group  ?   BP 04/15/22 1339 (!) 153/66  ?   Pulse Rate 04/15/22 1339 64  ?   Resp 04/15/22 1339 16  ?   Temp 04/15/22 1339 98.4 ?F (36.9 ?C)  ?   Temp src --   ?   SpO2 04/15/22 1339 95 %  ?   Weight --   ?   Height --   ?   Head Circumference --   ?   Peak Flow --   ?   Pain Score 04/15/22 1337 0  ?   Pain Loc --   ?   Pain Edu? --   ?   Excl. in [place]? --   ? ? ?Most recent vital signs: ?Vitals:  ? 04/15/22 1630 04/15/22 1730  ?BP: (!) 126/55 134/64  ?Pulse: 64 67  ?Resp:    ?Temp:    ?SpO2: 98% 100%  ? ? ?Constitutional: Alert  and oriented. ?Eyes: Conjunctivae are normal. Scleral icterus noted. ?Head: Atraumatic. ?Nose: No congestion/rhinnorhea. ?Mouth/Throat: Mucous membranes are moist.  ?Cardiovascular: Normal rate, regular rhythm. Grossly normal heart sounds.  2+ radial pulses bilaterally. ?Respiratory: Normal respiratory effort.  No retractions. Lungs CTAB. ?Gastrointestinal: Soft and mildly tender to palpation in the right upper quadrant with no rebound or guarding. No distention. ?Musculoskeletal: No lower extremity tenderness nor edema.  ?Neurologic:  Normal speech and language. No gross focal neurologic deficits are appreciated. ? ? ? ?ED Results / Procedures / Treatments  ? ?Labs ?(all labs ordered are listed, but only abnormal results are displayed) ?Labs Reviewed  ?COMPREHENSIVE METABOLIC PANEL - Abnormal; Notable for the following components:  ?    Result Value  ? Sodium 133 (*)   ? Potassium 3.3 (*)   ? Glucose, Bld 117 (*)   ? AST 196 (*)   ? ALT 506 (*)   ? Alkaline Phosphatase 300 (*)   ? Total Bilirubin 7.6 (*)   ? All other components within normal limits  ?BILIRUBIN, DIRECT - Abnormal;  Notable for the following components:  ? Bilirubin, Direct 4.9 (*)   ? All other components within normal limits  ?LIPASE, BLOOD  ?CBC  ?HEPATITIS PANEL, ACUTE  ? ? ?RADIOLOGY ?CT of abdomen/pelvis reviewed by me with no focal fluid collection, fat stranding, or dilated bowel loops. ? ?PROCEDURES: ? ?Critical Care performed: No ? ?Procedures ? ? ?MEDICATIONS ORDERED IN ED: ?Medications  ?LORazepam (ATIVAN) injection 1 mg (1 mg Intravenous Given 04/15/22 1800)  ?sodium chloride 0.9 % bolus 1,000 mL (0 mLs Intravenous Stopped 04/15/22 1800)  ?iohexol (OMNIPAQUE) 300 MG/ML solution 100 mL (100 mLs Intravenous Contrast Given 04/15/22 1637)  ?gadobutrol (GADAVIST) 1 MMOL/ML injection 10 mL (10 mLs Intravenous Contrast Given 04/15/22 1837)  ? ? ? ?IMPRESSION / MDM / ASSESSMENT AND PLAN / ED COURSE  ?I reviewed the triage vital signs and the nursing  notes. ?             ?               ? ?66 y.o. female with past medical history of cholecystectomy and biliary stent who presents to the ED complaining of 5 days of generalized weakness initially associated with nausea and vomiting now with some mild pain in the right upper quadrant of her abdomen. ? ?Differential diagnosis includes, but is not limited to, retained gallstones, choledocholithiasis, obstructed biliary stent, hepatitis, pancreatitis, UTI. ? ?Patient nontoxic-appearing and in no acute distress, vital signs are unremarkable and she has only mild tenderness in the right upper quadrant of her abdomen.  Initial labs are concerning for moderate transaminitis with elevated bilirubin and alkaline phosphatase, overall concerning for biliary obstruction.  We will add on direct bilirubin level, no findings to suggest infection at this time with reassuring vitals and no leukocytosis.  Renal function is within normal limits and patient without significant electrolyte abnormality.  We will hydrate with IV fluids, she declines pain or nausea medication at this time.  Plan to further assess with CT scan for biliary dilatation and likely consult GI. ? ?CT scan shows no evidence of biliary dilatation, but does show thickened endometrium, although this is unlikely to be the source of patient's symptoms.  Findings reviewed with Dr. Marius Ditch of GI, who recommends proceeding with MRCP.  We will also add on viral hepatitis panel. ? ?MRCP shows multiple stacking stones in the common bile duct, likely explaining patient's lab abnormalities.  Patient to be made n.p.o. at midnight for potential ERCP tomorrow, case discussed with hospitalist for admission. ? ?  ? ? ?FINAL CLINICAL IMPRESSION(S) / ED DIAGNOSES  ? ?Final diagnoses:  ?Choledocholithiasis  ? ? ? ?Rx / DC Orders  ? ?ED Discharge Orders   ? ? None  ? ?  ? ? ? ?Note:  This document was prepared using Dragon voice recognition software and may include unintentional  dictation errors. ?  ?Blake Divine, MD ?04/15/22 2025 ? ?

## 2022-04-15 NOTE — Assessment & Plan Note (Addendum)
resolved with IV normal saline with added potassium chloride  ?follow BMP ?

## 2022-04-15 NOTE — ED Triage Notes (Addendum)
Pt come with c/o belly pain for few days. Pt went to UC and had UA performed. UA + for Bilirubinuria. They were informed to come here for further tests.  ? ? ?

## 2022-04-15 NOTE — Discharge Instructions (Addendum)
Your urine has large amount of bilirubine which is coming from your liver. ?You need to go to ER and have more test done.  ?

## 2022-04-16 ENCOUNTER — Encounter
Admission: EM | Disposition: A | Discharge status: Home / Self Care | Payer: Self-pay | Source: Home / Self Care | Attending: Internal Medicine

## 2022-04-16 ENCOUNTER — Inpatient Hospital Stay: Payer: Medicare HMO | Admitting: Anesthesiology

## 2022-04-16 ENCOUNTER — Inpatient Hospital Stay: Payer: Medicare HMO

## 2022-04-16 ENCOUNTER — Encounter: Payer: Self-pay | Admitting: Family Medicine

## 2022-04-16 DIAGNOSIS — R7989 Other specified abnormal findings of blood chemistry: Secondary | ICD-10-CM | POA: Diagnosis not present

## 2022-04-16 DIAGNOSIS — K8050 Calculus of bile duct without cholangitis or cholecystitis without obstruction: Secondary | ICD-10-CM | POA: Diagnosis not present

## 2022-04-16 HISTORY — PX: ERCP: SHX5425

## 2022-04-16 LAB — COMPREHENSIVE METABOLIC PANEL
ALT: 297 U/L — ABNORMAL HIGH (ref 0–44)
AST: 90 U/L — ABNORMAL HIGH (ref 15–41)
Albumin: 3 g/dL — ABNORMAL LOW (ref 3.5–5.0)
Alkaline Phosphatase: 289 U/L — ABNORMAL HIGH (ref 38–126)
Anion gap: 7 (ref 5–15)
BUN: 7 mg/dL — ABNORMAL LOW (ref 8–23)
CO2: 24 mmol/L (ref 22–32)
Calcium: 8.9 mg/dL (ref 8.9–10.3)
Chloride: 108 mmol/L (ref 98–111)
Creatinine, Ser: 0.59 mg/dL (ref 0.44–1.00)
GFR, Estimated: >60 mL/min (ref >60)
Glucose, Bld: 97 mg/dL (ref 70–99)
Potassium: 4 mmol/L (ref 3.5–5.1)
Sodium: 139 mmol/L (ref 135–145)
Total Bilirubin: 5.2 mg/dL — ABNORMAL HIGH (ref 0.3–1.2)
Total Protein: 6.3 g/dL — ABNORMAL LOW (ref 6.5–8.1)

## 2022-04-16 LAB — CBC
HCT: 38.6 % (ref 36.0–46.0)
Hemoglobin: 12.8 g/dL (ref 12.0–15.0)
MCH: 31.4 pg (ref 26.0–34.0)
MCHC: 33.2 g/dL (ref 30.0–36.0)
MCV: 94.6 fL (ref 80.0–100.0)
Platelets: 208 10*3/uL (ref 150–400)
RBC: 4.08 MIL/uL (ref 3.87–5.11)
RDW: 13.1 % (ref 11.5–15.5)
WBC: 6.8 10*3/uL (ref 4.0–10.5)
nRBC: 0 % (ref 0.0–0.2)

## 2022-04-16 IMAGING — CR DG C-ARM 1-60 MIN
4 series · 4 of 4 positions shown · non-contrast
Comparison: MR and CT from previous day

CLINICAL DATA: Choledocholithiasis, previous cholecystectomy

EXAM:
ERCP
TECHNIQUE: Multiple spot images obtained with the fluoroscopic device and
submitted for interpretation post-procedure.
FLUOROSCOPY:
Radiation Exposure Index (as provided by the fluoroscopic device):
118.98 mGy Kerma

[cont. (1 of 4)]
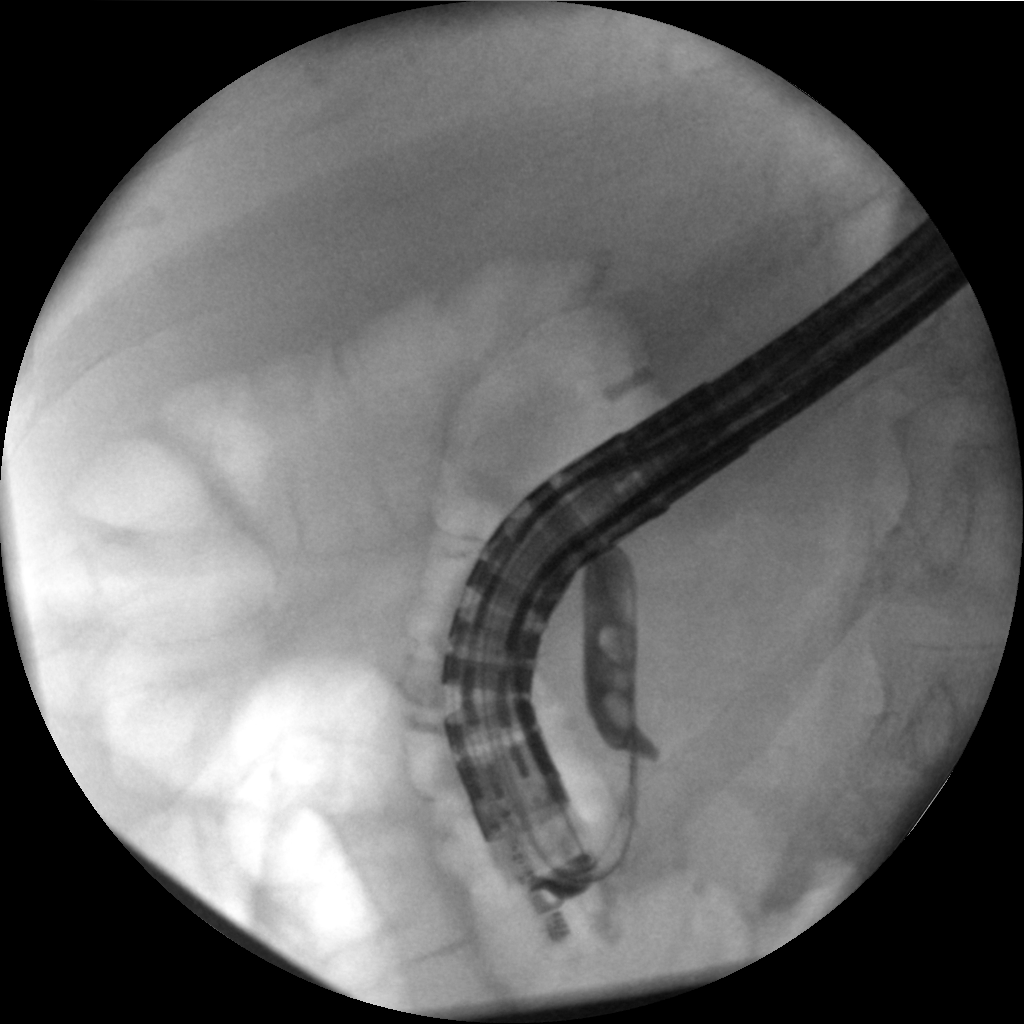

[cont. (2 of 4)]
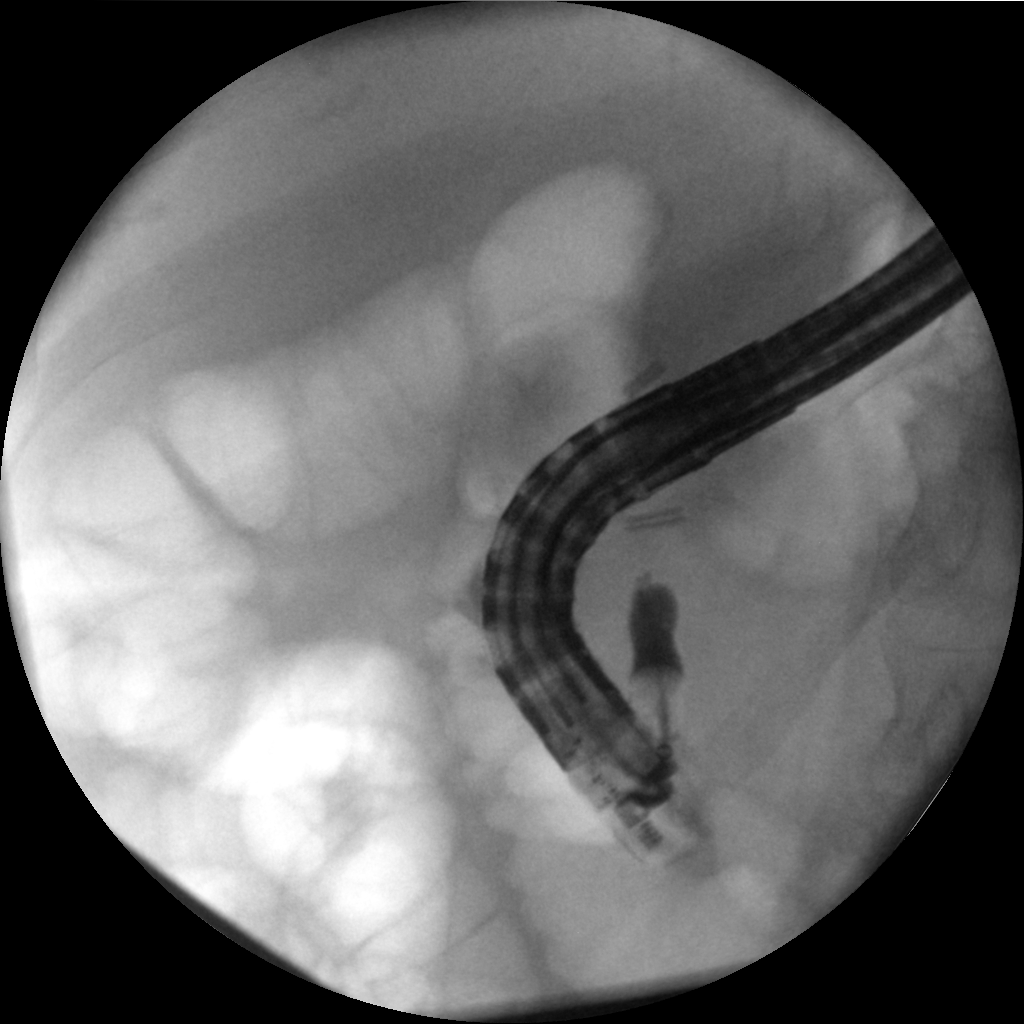

[cont. (3 of 4)]
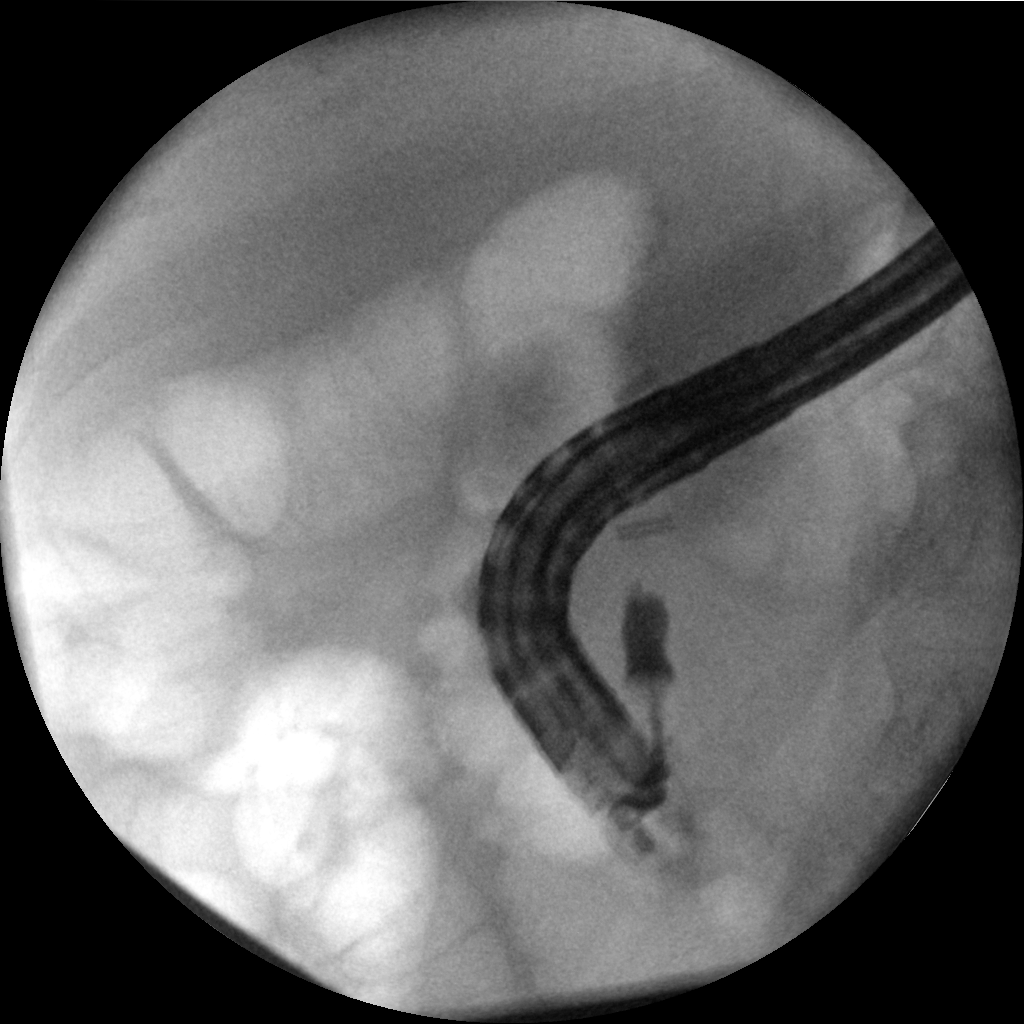

[cont. (4 of 4)]
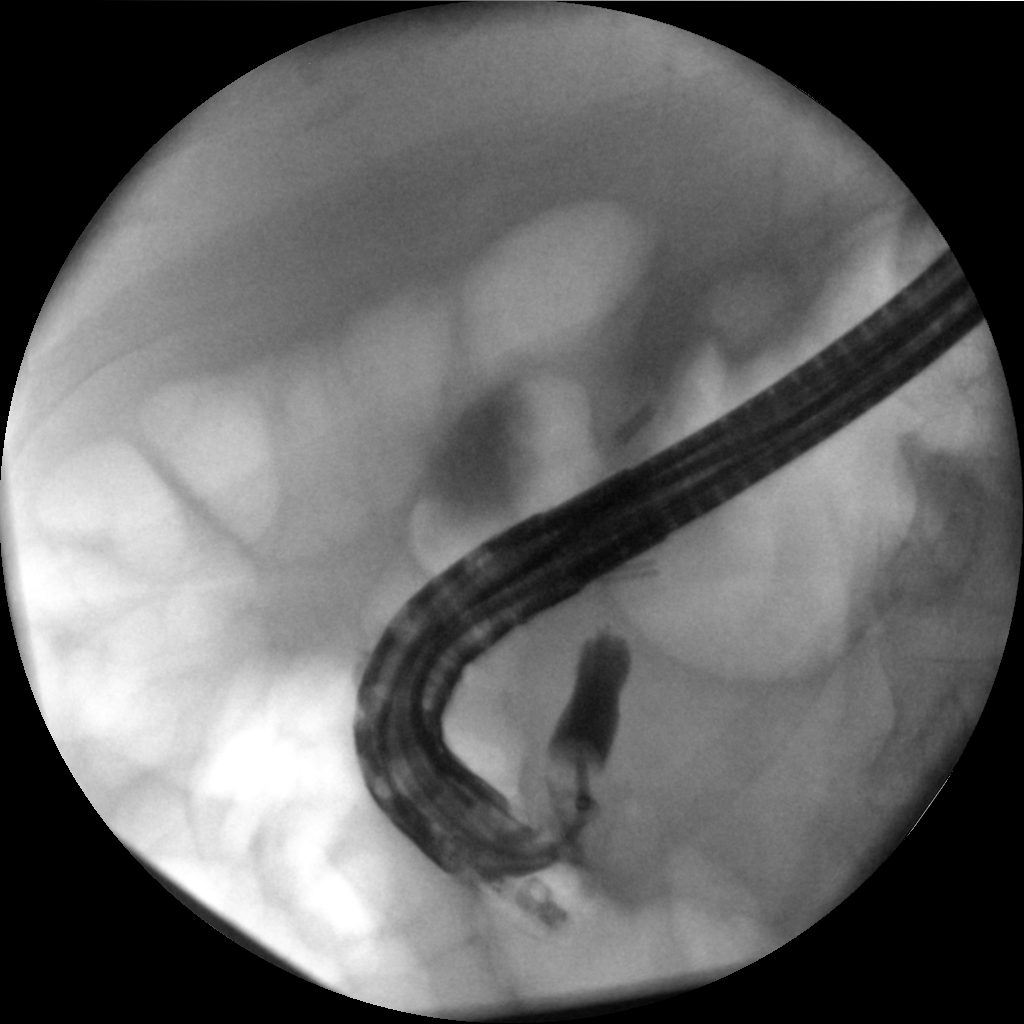

[4 of 4 positions shown; findings below may reference images not displayed]

FINDINGS: Series of fluoroscopic spot images document endoscopic cannulation
and opacification of the distal CBD. Filling defects are noted
within the lumen on early image. Subsequent images document balloon
catheter sweep. There is abrupt cut off in the mid CBD. At least 2
surgical clips overlie the approximate proximal course of the CBD on
this single projection. The proximal CBD and intrahepatic biliary
tree are not opacified.
IMPRESSION: 1. Endoscopic CBD cannulation and intervention as above
2. Occlusion of the mid CBD with filling defects in the distal
segment.
3. Non opacification of the intrahepatic biliary tree or proximal
CBD.

These images were submitted for radiologic interpretation only.
Please see the procedural report for the amount of contrast and the
fluoroscopy time utilized.

## 2022-04-16 IMAGING — CR DG C-ARM 1-60 MIN
4 series · 4 of 4 positions shown · non-contrast
Comparison: MR and CT from previous day

CLINICAL DATA: Choledocholithiasis, previous cholecystectomy

EXAM:
ERCP
TECHNIQUE: Multiple spot images obtained with the fluoroscopic device and
submitted for interpretation post-procedure.
FLUOROSCOPY:
Radiation Exposure Index (as provided by the fluoroscopic device):
118.98 mGy Kerma

[cont.]
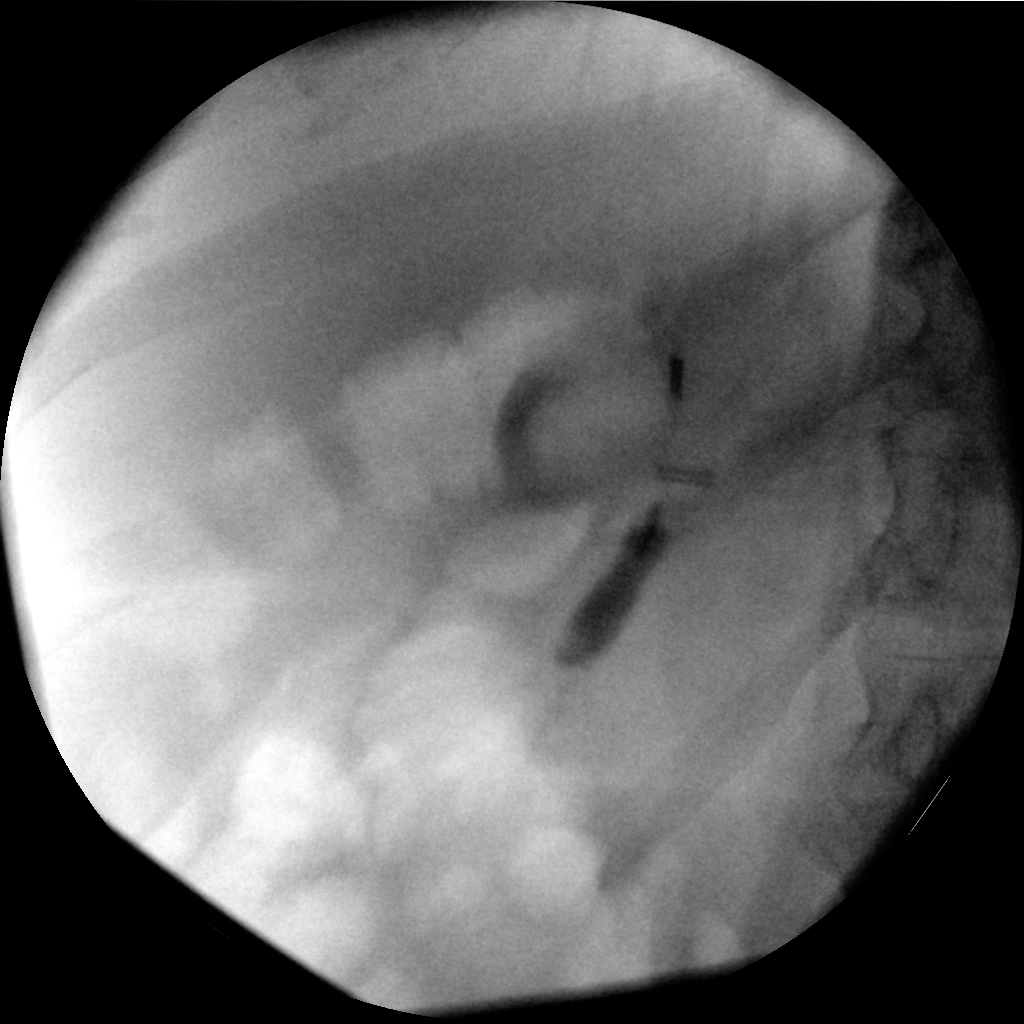

[dr (1 of 3)]
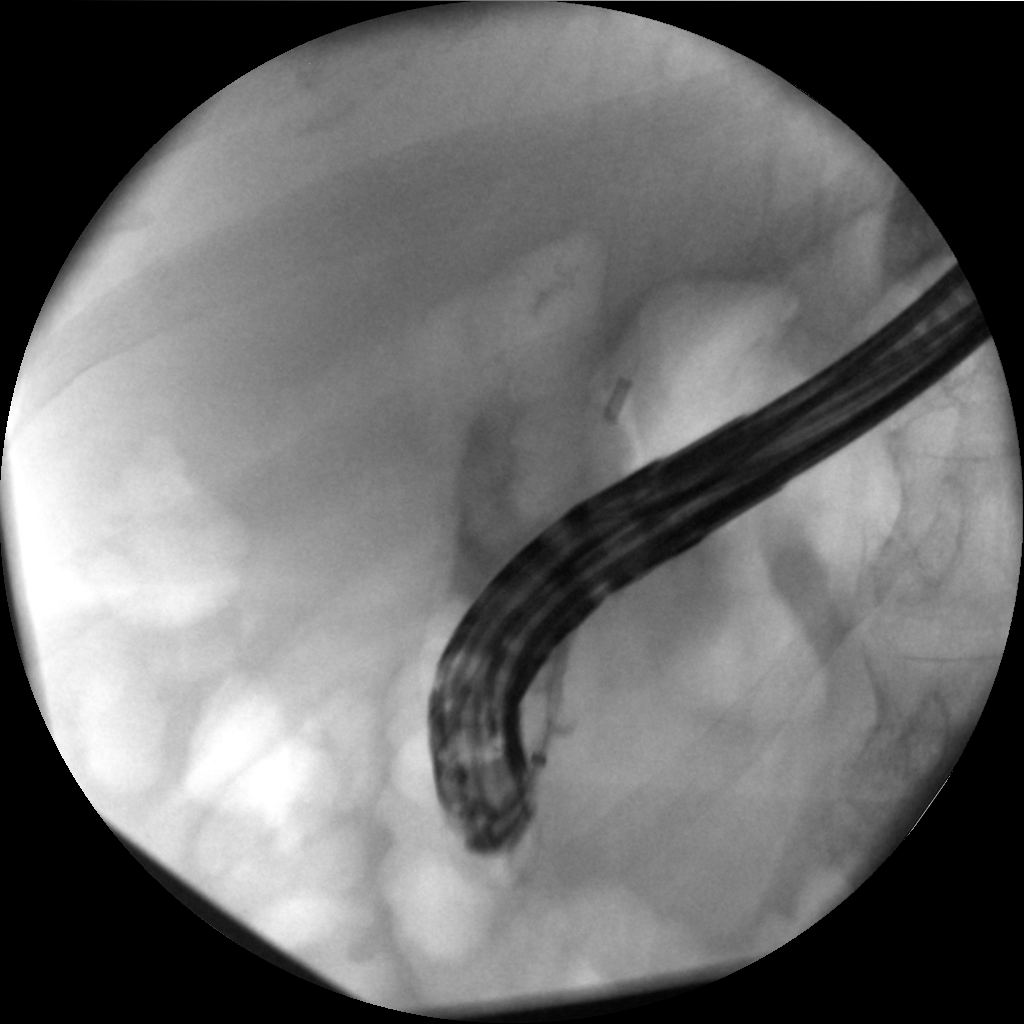

[dr (2 of 3)]
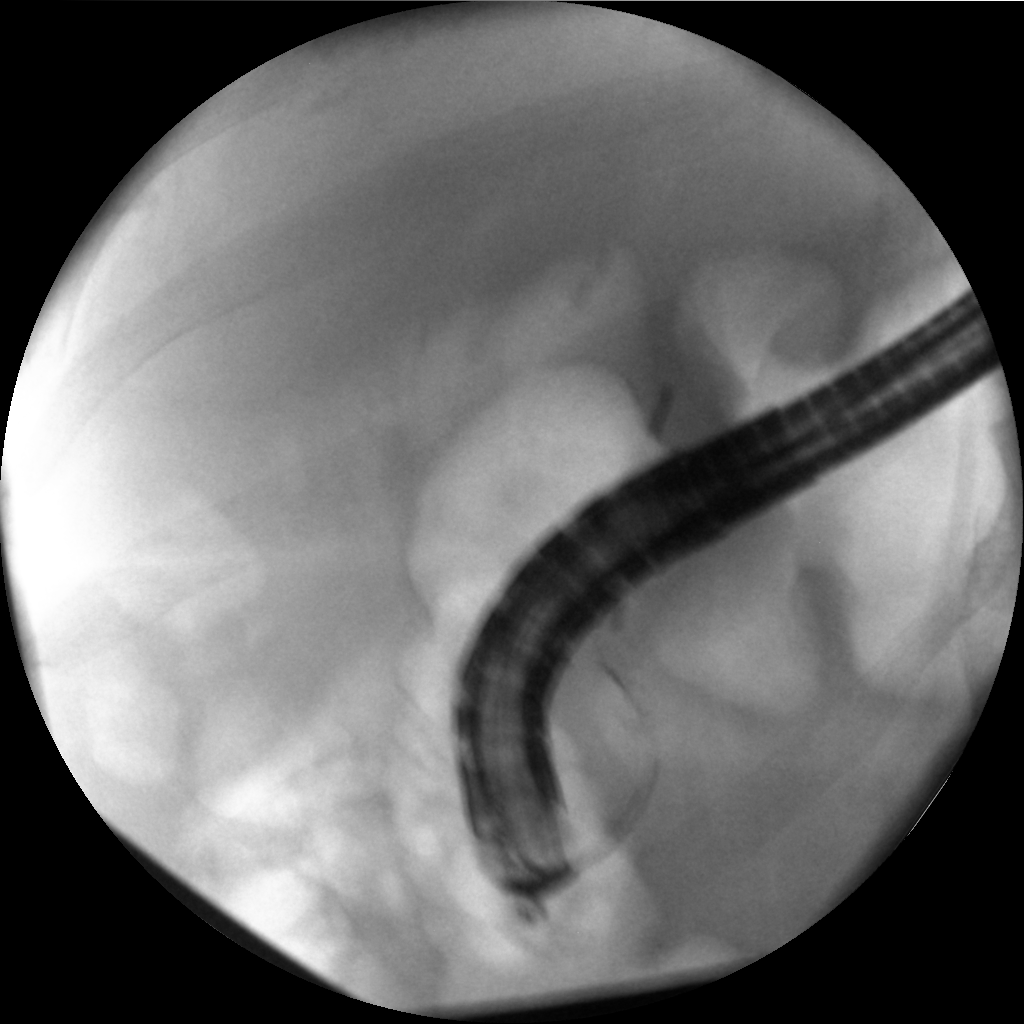

[dr (3 of 3)]
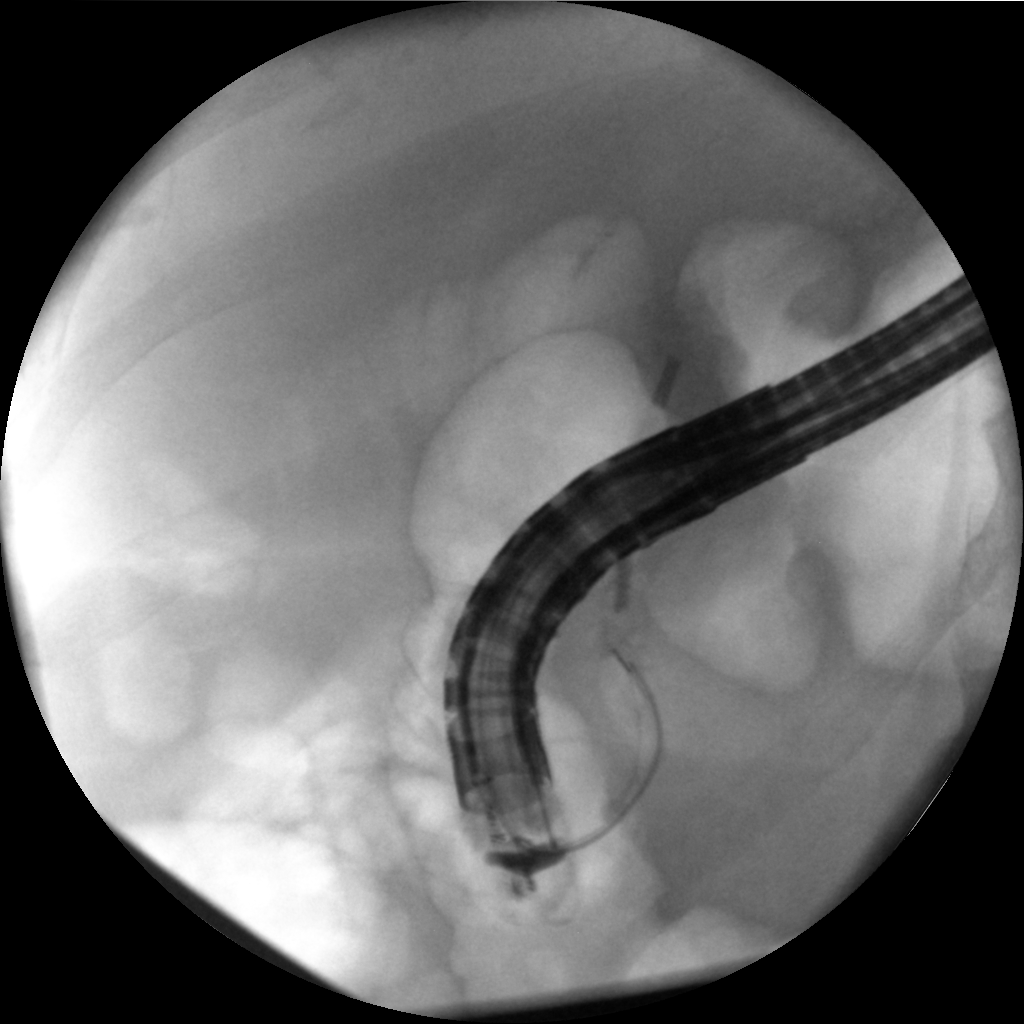

[4 of 4 positions shown; findings below may reference images not displayed]

FINDINGS: Series of fluoroscopic spot images document endoscopic cannulation
and opacification of the distal CBD. Filling defects are noted
within the lumen on early image. Subsequent images document balloon
catheter sweep. There is abrupt cut off in the mid CBD. At least 2
surgical clips overlie the approximate proximal course of the CBD on
this single projection. The proximal CBD and intrahepatic biliary
tree are not opacified.
IMPRESSION: 1. Endoscopic CBD cannulation and intervention as above
2. Occlusion of the mid CBD with filling defects in the distal
segment.
3. Non opacification of the intrahepatic biliary tree or proximal
CBD.

These images were submitted for radiologic interpretation only.
Please see the procedural report for the amount of contrast and the
fluoroscopy time utilized.

## 2022-04-16 SURGERY — ERCP, WITH INTERVENTION IF INDICATED
Anesthesia: General

## 2022-04-16 MED ORDER — ONDANSETRON HCL 4 MG/2ML IJ SOLN
INTRAMUSCULAR | Status: DC | PRN
  Administered 2022-04-16: 4 mg via INTRAVENOUS

## 2022-04-16 MED ORDER — INDOMETHACIN 25 MG SUPPOSITORY: 50.0000 mg | Freq: Once | RECTAL | Status: DC

## 2022-04-16 MED ORDER — LIDOCAINE HCL (CARDIAC) PF 100 MG/5ML IV SOSY
PREFILLED_SYRINGE | INTRAVENOUS | Status: DC | PRN
  Administered 2022-04-16: 60 mg via INTRAVENOUS

## 2022-04-16 MED ORDER — INDOMETHACIN 50 MG RE SUPP
100.0000 mg | Freq: Once | RECTAL | Status: AC
  Administered 2022-04-16: 100 mg via RECTAL

## 2022-04-16 MED ORDER — PROPOFOL 10 MG/ML IV BOLUS
INTRAVENOUS | Status: DC | PRN
  Administered 2022-04-16: 130 mg via INTRAVENOUS

## 2022-04-16 MED ORDER — EPHEDRINE 5 MG/ML INJ
INTRAVENOUS | Status: AC
  Filled 2022-04-16: qty 5

## 2022-04-16 MED ORDER — INDOMETHACIN 50 MG RE SUPP
RECTAL | Status: AC
  Administered 2022-04-16: 100 mg
  Filled 2022-04-16: qty 2

## 2022-04-16 MED ORDER — FENTANYL CITRATE (PF) 100 MCG/2ML IJ SOLN
INTRAMUSCULAR | Status: DC | PRN
  Administered 2022-04-16 (×2): 50 ug via INTRAVENOUS

## 2022-04-16 MED ORDER — FENTANYL CITRATE (PF) 100 MCG/2ML IJ SOLN
INTRAMUSCULAR | Status: AC
  Filled 2022-04-16: qty 2

## 2022-04-16 MED ORDER — DEXAMETHASONE SODIUM PHOSPHATE 10 MG/ML IJ SOLN
INTRAMUSCULAR | Status: AC
  Filled 2022-04-16: qty 1

## 2022-04-16 MED ORDER — MIDAZOLAM HCL 2 MG/2ML IJ SOLN
INTRAMUSCULAR | Status: DC | PRN
  Administered 2022-04-16: 2 mg via INTRAVENOUS

## 2022-04-16 MED ORDER — ENOXAPARIN SODIUM 60 MG/0.6ML IJ SOSY
0.5000 mg/kg | PREFILLED_SYRINGE | INTRAMUSCULAR | Status: DC
  Administered 2022-04-16: 60 mg via SUBCUTANEOUS

## 2022-04-16 MED ORDER — DEXAMETHASONE SODIUM PHOSPHATE 10 MG/ML IJ SOLN
INTRAMUSCULAR | Status: DC | PRN
Start: 2022-04-16 — End: 2022-04-16
  Administered 2022-04-16: 5 mg via INTRAVENOUS

## 2022-04-16 MED ORDER — ONDANSETRON HCL 4 MG/2ML IJ SOLN
INTRAMUSCULAR | Status: AC
  Filled 2022-04-16: qty 2

## 2022-04-16 MED ORDER — SODIUM CHLORIDE 0.9 % IV SOLN: INTRAVENOUS | Status: DC

## 2022-04-16 MED ORDER — LACTATED RINGERS IV SOLN
INTRAVENOUS | Status: DC
  Administered 2022-04-16: 20 mL/h via INTRAVENOUS

## 2022-04-16 MED ORDER — EPHEDRINE SULFATE (PRESSORS) 50 MG/ML IJ SOLN
INTRAMUSCULAR | Status: DC | PRN
  Administered 2022-04-16: 5 mg via INTRAVENOUS
  Administered 2022-04-16: 10 mg via INTRAVENOUS

## 2022-04-16 MED ORDER — PIPERACILLIN-TAZOBACTAM 3.375 G IVPB: 3.3750 g | Freq: Three times a day (TID) | INTRAVENOUS | Status: DC

## 2022-04-16 MED ORDER — MIDAZOLAM HCL 2 MG/2ML IJ SOLN
INTRAMUSCULAR | Status: AC
  Filled 2022-04-16: qty 2

## 2022-04-16 MED ORDER — SUCCINYLCHOLINE CHLORIDE 200 MG/10ML IV SOSY
PREFILLED_SYRINGE | INTRAVENOUS | Status: DC | PRN
  Administered 2022-04-16: 120 mg via INTRAVENOUS

## 2022-04-16 NOTE — Anesthesia Procedure Notes (Addendum)
Procedure Name: Intubation ?Date/Time: 04/16/2022 4:32 PM ?Performed by: Jerrye Noble, CRNA ?Pre-anesthesia Checklist: Patient identified, Emergency Drugs available, Suction available and Patient being monitored ?Patient Re-evaluated:Patient Re-evaluated prior to induction ?Oxygen Delivery Method: Circle system utilized ?Preoxygenation: Pre-oxygenation with 100% oxygen ?Induction Type: IV induction ?Ventilation: Mask ventilation without difficulty ?Laryngoscope Size: Mac, McGraph and 4 ?Grade View: Grade I ?Tube type: Oral ?Number of attempts: 1 ?Airway Equipment and Method: Stylet and Oral airway ?Placement Confirmation: ETT inserted through vocal cords under direct vision, positive ETCO2 and breath sounds checked- equal and bilateral ?Secured at: 22 cm ?Tube secured with: Tape ?Dental Injury: Teeth and Oropharynx as per pre-operative assessment  ? ? ? ? ?

## 2022-04-16 NOTE — ED Notes (Signed)
RN aware bed assigned ?

## 2022-04-16 NOTE — Op Note (Signed)
[facility] ?Gastroenterology ?Patient Name: [name] ?Procedure Date: 04/16/2022 3:59 PM ?MRN: [id number] ?Account #: [id number] ?Date of Birth: [birth year] ?Admit Type: Outpatient ?Age: 66 ?Room: [facility] ENDO ROOM 4 ?Gender: Female ?Note Status: Finalized ?Instrument Name: EXALT Ercp scope ?Procedure:             ERCP ?Indications:           Common bile duct stone(s) ?Providers:             Midge Minium MD, MD ?Medicines:             General Anesthesia ?Complications:         No immediate complications. ?Procedure:             Pre-Anesthesia Assessment: ?                       - Prior to the procedure, a History and Physical was  ?                       performed, and patient medications and allergies were  ?                       reviewed. The patient's tolerance of previous  ?                       anesthesia was also reviewed. The risks and benefits  ?                       of the procedure and the sedation options and risks  ?                       were discussed with the patient. All questions were  ?                       answered, and informed consent was obtained. Prior  ?                       Anticoagulants: The patient has taken no previous  ?                       anticoagulant or antiplatelet agents. ASA Grade  ?                       Assessment: II - A patient with mild systemic disease.  ?                       After reviewing the risks and benefits, the patient  ?                       was deemed in satisfactory condition to undergo the  ?                       procedure. ?                       After obtaining informed consent, the scope was passed  ?                       under direct vision. Throughout the procedure, the  ?  patient's blood pressure, pulse, and oxygen  ?                       saturations were monitored continuously. The [place]  ?                       [employer] Model D single use duodenoscope was  ?                        introduced through the mouth, and used to inject  ?                       contrast into and used to inject contrast into the  ?                       bile duct. The ERCP was accomplished without  ?                       difficulty. The patient tolerated the procedure well. ?Findings: ?     A scout film of the abdomen was obtained. Surgical clips, consistent  ?     with a previous cholecystectomy, were seen in the area of the right  ?     upper quadrant of the abdomen. The esophagus was successfully intubated  ?     under direct vision. The scope was advanced to a normal major papilla in  ?     the descending duodenum without detailed examination of the pharynx,  ?     larynx and associated structures, and upper GI tract. The upper GI tract  ?     was grossly normal. The bile duct was deeply cannulated with the  ?     short-nosed traction sphincterotome. Contrast was injected. I personally  ?     interpreted the bile duct images. There was brisk flow of contrast  ?     through the ducts. Image quality was excellent. Contrast extended to the  ?     main bile duct. The lower third of the main bile duct contained filling  ?     defect(s) thought to be a stone. Distal CBD stump, A wire was passed  ?     into the biliary tree. A 7 mm biliary sphincterotomy was made with a  ?     traction (standard) sphincterotome using ERBE electrocautery. There was  ?     no post-sphincterotomy bleeding. The biliary tree was swept with a 15 mm  ?     balloon starting at the lower third of the main duct. A few stones were  ?     removed. No stones remained. ?Impression:            - A filling defect consistent with a stone was seen on  ?                       the cholangiogram in the CBD stump. ?                       - Choledocholithiasis was found. Complete removal was  ?                       accomplished by biliary sphincterotomy and balloon  ?  extraction. ?                       - A biliary sphincterotomy was  performed. ?                       - The distal CBD was swept. ?                       - The patient had an injury to the CBD when she had  ?                       her gall bladder out and it appears she had a  ?                       choledochoduodenostomy with a distal CBD stump  ?                       remaining. ?Recommendation:        - Discussed with Dr. Inocente Salles ?                       - Return patient to hospital ward for ongoing care. ?                       - NPO after midnight. ?                       - Refer to an interventional radiologist. ?Procedure Code(s):     --- Professional --- ?                       [phone], Endoscopic retrograde cholangiopancreatography  ?                       (ERCP); with removal of calculi/debris from  ?                       biliary/pancreatic duct(s) ?                       [id number], Endoscopic retrograde cholangiopancreatography  ?                       (ERCP); with sphincterotomy/papillotomy ?Diagnosis Code(s):     --- Professional --- ?                       K80.50, Calculus of bile duct without cholangitis or  ?                       cholecystitis without obstruction ?CPT copyright 2019 American Medical Association. All rights reserved. ?The codes documented in this report are preliminary and upon coder review may  ?be revised to meet current compliance requirements. ?Midge Minium MD, MD ?04/16/2022 6:26:09 PM ?This report has been signed electronically. ?Number of Addenda: 0 ?Note Initiated On: 04/16/2022 3:59 PM ?Estimated Blood Loss:  Estimated blood loss: none. ?     [facility] ?

## 2022-04-16 NOTE — Anesthesia Preprocedure Evaluation (Signed)
Anesthesia Evaluation  ?Patient identified by MRN, date of birth, ID band ?Patient awake ? ? ? ?Reviewed: ?Allergy & Precautions, NPO status , Patient's Chart, lab work & pertinent test results ? ?History of Anesthesia Complications ?Negative for: history of anesthetic complications ? ?Airway ?Mallampati: III ? ? ?Neck ROM: Full ? ? ? Dental ? ?(+) Partial Lower, Partial Upper, Chipped ?  ?Pulmonary ?neg pulmonary ROS,  ?  ?Pulmonary exam normal ?breath sounds clear to auscultation ? ? ? ? ? ? Cardiovascular ?Exercise Tolerance: Good ?negative cardio ROS ?Normal cardiovascular exam ?Rhythm:Regular Rate:Normal ? ? ?  ?Neuro/Psych ?negative neurological ROS ?   ? GI/Hepatic ?Choledocholithiasis  ?  ?Endo/Other  ?Class 3 obesity ? Renal/GU ?negative Renal ROS  ? ?  ?Musculoskeletal ? ? Abdominal ?  ?Peds ? Hematology ?negative hematology ROS ?(+)   ?Anesthesia Other Findings ? ? Reproductive/Obstetrics ? ?  ? ? ? ? ? ? ? ? ? ? ? ? ? ?  ?  ? ? ? ? ? ? ? ? ?Anesthesia Physical ?Anesthesia Plan ? ?ASA: 3 ? ?Anesthesia Plan: General  ? ?Post-op Pain Management:   ? ?Induction: Intravenous ? ?PONV Risk Score and Plan: 3 and Ondansetron, Dexamethasone and Treatment may vary due to age or medical condition ? ?Airway Management Planned: Oral ETT ? ?Additional Equipment:  ? ?Intra-op Plan:  ? ?Post-operative Plan: Extubation in OR ? ?Informed Consent: I have reviewed the patients History and Physical, chart, labs and discussed the procedure including the risks, benefits and alternatives for the proposed anesthesia with the patient or authorized representative who has indicated his/her understanding and acceptance.  ? ? ? ?Dental advisory given ? ?Plan Discussed with: CRNA ? ?Anesthesia Plan Comments: (Patient consented for risks of anesthesia including but not limited to:  ?- adverse reactions to medications ?- damage to eyes, teeth, lips or other oral mucosa ?- nerve damage due to positioning   ?- sore throat or hoarseness ?- damage to heart, brain, nerves, lungs, other parts of body or loss of life ? ?Informed patient about role of CRNA in peri- and intra-operative care.  Patient voiced understanding.)  ? ? ? ? ? ? ?Anesthesia Quick Evaluation ? ?

## 2022-04-16 NOTE — Anesthesia Postprocedure Evaluation (Signed)
Anesthesia Post Note ? ?Patient: [name] ? ?Procedure(s) Performed: ENDOSCOPIC RETROGRADE CHOLANGIOPANCREATOGRAPHY (ERCP) ? ?Patient location during evaluation: PACU ?Anesthesia Type: General ?Level of consciousness: awake and alert, oriented and patient cooperative ?Pain management: pain level controlled ?Vital Signs Assessment: post-procedure vital signs reviewed and stable ?Respiratory status: spontaneous breathing, nonlabored ventilation and respiratory function stable ?Cardiovascular status: blood pressure returned to baseline and stable ?Postop Assessment: adequate PO intake ?Anesthetic complications: no ? ? ?No notable events documented. ? ? ?Last Vitals:  ?Vitals:  ? 04/16/22 1833 04/16/22 1850  ?BP: (!) 158/73 (!) 158/86  ?Pulse: 64 60  ?Resp: 16 16  ?Temp:  36.4 ?C  ?SpO2: 95% 96%  ?  ?Last Pain:  ?Vitals:  ? 04/16/22 1833  ?TempSrc:   ?PainSc: 0-No pain  ? ? ?  ?  ?  ?  ?  ?  ? ?Reed Breech ? ? ? ? ?

## 2022-04-16 NOTE — Progress Notes (Signed)
Admission profile updated. ?

## 2022-04-16 NOTE — Consult Note (Signed)
? ? ? ?Cephas Darby, MD ?[address]  ?[address]  ?[place], [place] [address]  ?Main: [phone]  ?Fax: [phone] ?Pager: [phone] ? ? Consultation ? ?Referring Provider:     No ref. provider found ?Primary Care Physician:  Pcp, No ?Primary Gastroenterologist:Unassigned        ?Reason for Consultation:     Choledocholithiasis ? ?Date of Admission:  04/15/2022 ?Date of Consultation:  04/16/2022 ?       ? HPI:   ?[name] is a 66 y.o. female with history of cholecystectomy in 2005, injury to the bile duct s/p biliary stent placement.  She presented to ER secondary to acute onset of right upper quadrant pain and weakness.  She had nausea, vomiting and diarrhea 5 days ago that lasted for 24 hours.  She has persistent nausea but without any vomiting or diarrhea.  She denies any fever, chills.  She was seen in the urgent care, was found to have elevated bilirubin in her urine analysis, therefore sent to ER.  She was hemodynamically stable in the ER, labs were significant for abnormal LFTs alkaline phosphatase 300, AST 196, ALT 506, T. bili 7.6, direct bilirubin 4.9, CBC normal, urine analysis was suspicious for UTI.  She underwent CT abdomen and pelvis with contrast which revealed heterogeneously thickened endometrium, no focal liver abnormality, no biliary dilation s/p cholecystectomy.  Subsequently, underwent MRCP which confirmed choledocholithiasis as well as moderate intrahepatic biliary ductal dilation.  Therefore, GI is consulted to evaluate for ERCP.  Patient remains hemodynamically stable, has been afebrile with no leukocytosis, stable lipase normal, LFTs slightly improved from yesterday, T. bili down to 5.2 ? ?When I interviewed the patient, she was lying comfortably in bed watching TV.  She denies any abdominal pain, she feels hungry.  She is little nervous about the procedure ? ?NSAIDs: None ? ?Antiplts/Anticoagulants/Anti thrombotics: None ? ?GI Procedures: ERCP in 2005 ? ?Past  Medical History:  ?Diagnosis Date  ?? Injury, blood vessel   ? ? ?Past Surgical History:  ?Procedure Laterality Date  ?? CHOLECYSTECTOMY    ?? ENDOVENOUS ABLATION SAPHENOUS VEIN W/ LASER Left 11/12/2017  ? endovenous laser ablation left greater saphenous vein by Tinnie Gens MD   ?? VASCULAR SURGERY    ? ? ? ?Current Facility-Administered Medications:  ??  0.9 % NaCl with KCl 40 mEq / L  infusion, , Intravenous, Continuous, Sreenath, Sudheer B, MD, Last Rate: 50 mL/hr at 04/15/22 2109, New Bag at 04/15/22 2109 ??  acetaminophen (TYLENOL) tablet 650 mg, 650 mg, Oral, Q6H PRN **OR** acetaminophen (TYLENOL) suppository 650 mg, 650 mg, Rectal, Q6H PRN, Mansy, Arvella Merles, MD ??  enoxaparin (LOVENOX) injection 60 mg, 0.5 mg/kg, Subcutaneous, Q24H, Darrick Penna, RPH, 60 mg at 04/16/22 1212 ??  LORazepam (ATIVAN) injection 1 mg, 1 mg, Intravenous, Q4H PRN, Blake Divine, MD, 1 mg at 04/15/22 1800 ??  magnesium hydroxide (MILK OF MAGNESIA) suspension 30 mL, 30 mL, Oral, Daily PRN, Mansy, Arvella Merles, MD ??  ondansetron (ZOFRAN) tablet 4 mg, 4 mg, Oral, Q6H PRN **OR** ondansetron (ZOFRAN) injection 4 mg, 4 mg, Intravenous, Q6H PRN, Mansy, Arvella Merles, MD ??  traZODone (DESYREL) tablet 25 mg, 25 mg, Oral, QHS PRN, Mansy, Arvella Merles, MD ?No current outpatient medications on file. ? ? ?Family History  ?Problem Relation Age of Onset  ?? Heart disease Mother   ?? Diabetes Mother   ?? Cancer Father   ?? Heart disease Father   ?? Hypertension Father   ?  ? ?  Social History  ? ?Tobacco Use  ?? Smoking status: Never  ?? Smokeless tobacco: Never  ?Vaping Use  ?? Vaping Use: Never used  ?Substance Use Topics  ?? Alcohol use: No  ?? Drug use: No  ? ? ?Allergies as of 04/15/2022 - Review Complete 04/15/2022  ?Allergen Reaction Noted  ?? Codeine  06/21/2015  ?? Septra ds [sulfamethoxazole-trimethoprim] Other (See Comments) 11/21/2015  ?? Tramadol Other (See Comments) 11/21/2015  ?? Sulfa antibiotics  01/31/2021  ? ? ?Review of Systems:    ?All systems  reviewed and negative except where noted in HPI. ? ? Physical Exam:  ?Vital signs in last 24 hours: ?Temp:  [98.2 ?F (36.8 ?C)-98.6 ?F (37 ?C)] 98.2 ?F (36.8 ?C) (04/24 1200) ?Pulse Rate:  [64-80] 69 (04/24 1200) ?Resp:  [16-20] 18 (04/24 1200) ?BP: (109-169)/(52-66) 169/53 (04/24 1200) ?SpO2:  [93 %-100 %] 93 % (04/24 1200) ?  ?General:   Pleasant, cooperative in NAD ?Head:  Normocephalic and atraumatic. ?Eyes:   No icterus.   Conjunctiva pink. PERRLA. ?Ears:  Normal auditory acuity. ?Neck:  Supple; no masses or thyroidomegaly ?Lungs: Respirations even and unlabored. Lungs clear to auscultation bilaterally.   No wheezes, crackles, or rhonchi.  ?Heart:  Regular rate and rhythm;  Without murmur, clicks, rubs or gallops ?Abdomen:  Soft, nondistended, nontender. Normal bowel sounds. No appreciable masses or hepatomegaly.  No rebound or guarding.  ?Rectal:  Not performed. ?Msk:  Symmetrical without gross deformities.  Strength generalized weakness ?Extremities:  Without edema, cyanosis or clubbing. ?Neurologic:  Alert and oriented x3;  grossly normal neurologically. ?Skin:  Intact without significant lesions or rashes. ?Psych:  Alert and cooperative. Normal affect. ? ?LAB RESULTS: ? ?  Latest Ref Rng & Units 04/16/2022  ?  5:00 AM 04/15/2022  ?  1:39 PM 08/21/2021  ? 10:50 AM  ?CBC  ?WBC 4.0 - 10.5 K/uL 6.8   9.9   9.1    ?Hemoglobin 12.0 - 15.0 g/dL 12.8   14.6   14.6    ?Hematocrit 36.0 - 46.0 % 38.6   43.5   43.8    ?Platelets 150 - 400 K/uL 208   234   280    ? ? ?BMET ? ?  Latest Ref Rng & Units 04/16/2022  ?  5:00 AM 04/15/2022  ?  1:39 PM 08/21/2021  ? 10:50 AM  ?BMP  ?Glucose 70 - 99 mg/dL 97   117   103    ?BUN 8 - 23 mg/dL 7   10   18     ?Creatinine 0.44 - 1.00 mg/dL 0.59   0.70   0.73    ?Sodium 135 - 145 mmol/L 139   133   136    ?Potassium 3.5 - 5.1 mmol/L 4.0   3.3   4.5    ?Chloride 98 - 111 mmol/L 108   99   101    ?CO2 22 - 32 mmol/L 24   27   29     ?Calcium 8.9 - 10.3 mg/dL 8.9   9.1   9.4    ? ? ?LFT ? ?   Latest Ref Rng & Units 04/16/2022  ?  5:00 AM 04/15/2022  ?  1:39 PM 08/21/2021  ? 10:50 AM  ?Hepatic Function  ?Total Protein 6.5 - 8.1 g/dL 6.3   7.7   7.6    ?Albumin 3.5 - 5.0 g/dL 3.0   3.8   4.2    ?AST 15 - 41 U/L 90  196   33    ?ALT 0 - 44 U/L 297   506   36    ?Alk Phosphatase 38 - 126 U/L 289   300   96    ?Total Bilirubin 0.3 - 1.2 mg/dL 5.2   7.6   0.6    ?Bilirubin, Direct 0.0 - 0.2 mg/dL  4.9     ? ? ? ?STUDIES: ?CT Abdomen Pelvis W Contrast ? ?Result Date: 04/15/2022 ?CLINICAL DATA:  Suspected biliary obstruction.  Suprapubic pain. EXAM: CT ABDOMEN AND PELVIS WITH CONTRAST TECHNIQUE: Multidetector CT imaging of the abdomen and pelvis was performed using the standard protocol following bolus administration of intravenous contrast. RADIATION DOSE REDUCTION: This exam was performed according to the departmental dose-optimization program which includes automated exposure control, adjustment of the mA and/or kV according to patient size and/or use of iterative reconstruction technique. CONTRAST:  153m OMNIPAQUE IOHEXOL 300 MG/ML  SOLN COMPARISON:  May 18, 2013 FINDINGS: Lower chest: No acute abnormality. Hepatobiliary: No focal liver abnormality is seen. Status post cholecystectomy. No biliary dilatation. Pancreas: Fatty replaced. Spleen: Normal in size without focal abnormality. Adrenals/Urinary Tract: Adrenal glands are unremarkable. Kidneys are normal, without renal calculi, focal lesion, or hydronephrosis. Bladder is unremarkable. Stomach/Bowel: Stomach is within normal limits. Appendix appears normal. No evidence of bowel wall thickening, distention, or inflammatory changes. Vascular/Lymphatic: Aortic atherosclerosis. No enlarged abdominal or pelvic lymph nodes. Reproductive: Distended heterogeneous endometrium measures 2.7 cm transversely. No adnexal masses seen. Other: No abdominal wall hernia or abnormality. No abdominopelvic ascites. Musculoskeletal: Mild posterior facet arthropathy of the lower  lumbosacral spine. IMPRESSION: 1. No evidence of biliary obstruction. 2. Distended heterogeneous endometrium measures 2.7 cm in thickness. Clinical correlation is recommended. Consider tissue sampling. Aortic Ath

## 2022-04-16 NOTE — Progress Notes (Signed)
PHARMACIST - PHYSICIAN COMMUNICATION ? ?CONCERNING:  Enoxaparin (Lovenox) for DVT Prophylaxis  ? ? ?RECOMMENDATION: ?Patient was prescribed enoxaprin 40mg  q24 hours for VTE prophylaxis.  ? ?There were no vitals filed for this visit. ? ?There is no height or weight on file to calculate BMI. ? ?Estimated Creatinine Clearance: 82.5 mL/min (by C-G formula based on SCr of 0.59 mg/dL). ? ? ?Based on [place] patient is candidate for enoxaparin 0.5mg /kg TBW SQ every 24 hours based on BMI being >30. ? ? ?DESCRIPTION: ?Pharmacy has adjusted enoxaparin dose per [facility] policy. ? ?Patient is now receiving enoxaparin (0.5 mg/kg) 60 mg every 24 hours  ? ? ?Thank you for allowing pharmacy to be a part of this pleasant patient?s care. ? ?Darrick Penna, PharmD, MS PGPM ?Clinical Pharmacist ?04/16/2022 ?8:48 AM ? ? ?

## 2022-04-16 NOTE — Consult Note (Signed)
Pharmacy Antibiotic Note ? ?[name] is a 66 y.o. female admitted on 04/15/2022 with intraabdominal infection.  Pharmacy has been consulted for zosyn dosing. ? ?Plan: ?Zosyn 3.375g IV q8h (4 hour infusion). ?Continue to monitor renal function daily with AM labs ? ?  ? ?Temp (24hrs), Avg:98.4 ?F (36.9 ?C), Min:98.4 ?F (36.9 ?C), Max:98.4 ?F (36.9 ?C) ? ?Recent Labs  ?Lab 04/15/22 ?1339 04/16/22 ?0500  ?WBC 9.9 6.8  ?CREATININE 0.70 0.59  ?  ?Estimated Creatinine Clearance: 82.5 mL/min (by C-G formula based on SCr of 0.59 mg/dL).   ? ?Allergies  ?Allergen Reactions  ? Codeine   ?  Other reaction(s): Other (See Comments) ?Stomach burning  ? Septra Ds [Sulfamethoxazole-Trimethoprim] Other (See Comments)  ?  Nausea   And stomach burning  ? Tramadol Other (See Comments)  ?  Makes me feel loopy    ? Sulfa Antibiotics   ?  Other reaction(s): Unknown ?As a child  ? ? ?Antimicrobials this admission: ?4/24 Zosyn >>> ? ?Dose adjustments this admission: ? ? ?Microbiology results: ?4/24 BCx: sent ? ?Thank you for allowing pharmacy to be a part of this patient?s care. ? ?Selinda Eon ?04/16/2022 8:54 AM ? ?

## 2022-04-16 NOTE — Progress Notes (Signed)
Pt received from post-op unit. No signs or symptoms of distress, Pt alert and oriented. Family at bedside. Will continue to monitor. ?

## 2022-04-16 NOTE — Transfer of Care (Signed)
Immediate Anesthesia Transfer of Care Note ? ?Patient: [name] ? ?Procedure(s) Performed: ENDOSCOPIC RETROGRADE CHOLANGIOPANCREATOGRAPHY (ERCP) ? ?Patient Location: PACU and Endoscopy Unit ? ?Anesthesia Type:General ? ?Level of Consciousness: awake, drowsy and patient cooperative ? ?Airway & Oxygen Therapy: Patient Spontanous Breathing ? ?Post-op Assessment: Report given to RN and Post -op Vital signs reviewed and stable ? ?Post vital signs: Reviewed and stable ? ?Last Vitals:  ?Vitals Value Taken Time  ?BP 155/86 04/16/2022 1804  ?Temp    ?Pulse 84   ?Resp 14   ?SpO2 93   ? ? ?Last Pain:  ?Vitals:  ? 04/16/22 1511  ?TempSrc: Temporal  ?PainSc: 0-No pain  ?   ? ?  ? ?Complications: No notable events documented. ?

## 2022-04-16 NOTE — Plan of Care (Signed)

## 2022-04-16 NOTE — Progress Notes (Signed)
?PROGRESS NOTE ? ? ? ?[name]  KE:[id number] DOB: 10-25-1956 DOA: 04/15/2022 ?PCP: Pcp, No  ? ? ?Brief Narrative:  ? 66 y.o. Caucasian female with medical history significant for previous cholecystectomy in 2005 and biliary stent for biliary duct injury, presented to the emergency room with acute onset of right upper quadrant abdominal pain and generalized weakness.  She initially developed nausea and vomiting as well as diarrhea 5 days ago that lasted about 24 hours.  After that she felt significantly weak that she slid out of bed last night without head injuries or syncope.  She admitted to persistent nausea with diminished appetite but no current vomiting or diarrhea.  No dysuria, oliguria, hematuria or flank pain.  She denies any fever or chills.  The patient was seen today in urgent care and was referred to the ED for elevated bilirubin in her urinalysis.  She denies any chest pain or dyspnea or cough or wheezing.  No dysuria, oliguria or hematuria or flank pain ? ?GI engaged from the emergency room.  Recommend MRCP.  MRCP shows several stacked stones within the common bile duct.  GI reengaged.  Plan for ERCP 4/24.  Patient NPO.  No signs of sepsis.  LFTs downtrending.  No signs of cholangitis. ? ? ?Assessment & Plan: ?  ?Principal Problem: ?  Choledocholithiasis ?Active Problems: ?  Elevated LFTs ?  Hypokalemia ?  Hyponatremia ? ?Choledocholithiasis ?Elevated LFTs ?No signs of sepsis or acute cholangitis ?GI engaged ?Plan: ?No indication for antibiotics at this time ?N.p.o. ?ERCP today for stone retrieval ?As needed pain management ? ?Hyponatremia ?Likely hypovolemic secondary to nausea and vomiting ?IV hydration ?Daily BMP ? ?Hypokalemia ?Monitor and replace ? ?Morbid obesity ?BMI 50 ?This complicates overall care and prognosis ?Counseled patient on weight loss ? ?DVT prophylaxis: SQ Lovenox ?Code Status: Full ?Family Communication:  ?Disposition Plan: Status is: Inpatient ?Remains inpatient  appropriate because: Acute choledocholithiasis.  Plan for ERCP today ? ? ?Level of care: Med-Surg ? ?Consultants:  ?GI ? ?Procedures:  ?ERCP 4/24 ? ?Antimicrobials: ?None ? ? ?Subjective: ?Seen and examined.  Sitting up in chair.  No visible distress.  Complains of right upper quadrant abdominal pain and weakness ? ?Objective: ?Vitals:  ? 04/16/22 0200 04/16/22 0400 04/16/22 0600 04/16/22 0900  ?BP: (!) 122/56 (!) 127/56 (!) 133/52 109/65  ?Pulse: 72 71 69 73  ?Resp: 18 18 18 20   ?Temp:    98.6 ?F (37 ?C)  ?TempSrc:    Oral  ?SpO2: 94% 93% 94% 93%  ? ? ?Intake/Output Summary (Last 24 hours) at 04/16/2022 1056 ?Last data filed at 04/15/2022 1800 ?Gross per 24 hour  ?Intake 1000 ml  ?Output --  ?Net 1000 ml  ? ?There were no vitals filed for this visit. ? ?Examination: ? ?General exam: No acute distress ?Respiratory system: Lungs clear.  Normal work of breathing.  Room air ?Cardiovascular system: S1-S2, RRR, no murmurs, no pedal edema ?Gastrointestinal system: Obese, soft, nondistended, tender to palpation RUQ ?Central nervous system: Alert and oriented. No focal neurological deficits. ?Extremities: Symmetric 5 x 5 power. ?Skin: No rashes, lesions or ulcers ?Psychiatry: Judgement and insight appear normal. Mood & affect appropriate.  ? ? ? ?Data Reviewed: I have personally reviewed following labs and imaging studies ? ?CBC: ?Recent Labs  ?Lab 04/15/22 ?1339 04/16/22 ?0500  ?WBC 9.9 6.8  ?HGB 14.6 12.8  ?HCT 43.5 38.6  ?MCV 93.8 94.6  ?PLT 234 208  ? ?Basic Metabolic Panel: ?Recent Labs  ?Lab 04/15/22 ?1339  04/15/22 ?2227 04/16/22 ?0500  ?NA 133*  --  139  ?K 3.3*  --  4.0  ?CL 99  --  108  ?CO2 27  --  24  ?GLUCOSE 117*  --  97  ?BUN 10  --  7*  ?CREATININE 0.70  --  0.59  ?CALCIUM 9.1  --  8.9  ?MG  --  1.8  --   ? ?GFR: ?Estimated Creatinine Clearance: 82.5 mL/min (by C-G formula based on SCr of 0.59 mg/dL). ?Liver Function Tests: ?Recent Labs  ?Lab 04/15/22 ?1339 04/16/22 ?0500  ?AST 196* 90*  ?ALT 506* 297*   ?ALKPHOS 300* 289*  ?BILITOT 7.6* 5.2*  ?PROT 7.7 6.3*  ?ALBUMIN 3.8 3.0*  ? ?Recent Labs  ?Lab 04/15/22 ?1339  ?LIPASE 23  ? ?No results for input(s): AMMONIA in the last 168 hours. ?Coagulation Profile: ?No results for input(s): INR, PROTIME in the last 168 hours. ?Cardiac Enzymes: ?No results for input(s): CKTOTAL, CKMB, CKMBINDEX, TROPONINI in the last 168 hours. ?BNP (last 3 results) ?No results for input(s): PROBNP in the last 8760 hours. ?HbA1C: ?No results for input(s): HGBA1C in the last 72 hours. ?CBG: ?No results for input(s): GLUCAP in the last 168 hours. ?Lipid Profile: ?No results for input(s): CHOL, HDL, LDLCALC, TRIG, CHOLHDL, LDLDIRECT in the last 72 hours. ?Thyroid Function Tests: ?No results for input(s): TSH, T4TOTAL, FREET4, T3FREE, THYROIDAB in the last 72 hours. ?Anemia Panel: ?No results for input(s): VITAMINB12, FOLATE, FERRITIN, TIBC, IRON, RETICCTPCT in the last 72 hours. ?Sepsis Labs: ?No results for input(s): PROCALCITON, LATICACIDVEN in the last 168 hours. ? ?No results found for this or any previous visit (from the past 240 hour(s)).  ? ? ? ? ? ?Radiology Studies: ?CT Abdomen Pelvis W Contrast ? ?Result Date: 04/15/2022 ?CLINICAL DATA:  Suspected biliary obstruction.  Suprapubic pain. EXAM: CT ABDOMEN AND PELVIS WITH CONTRAST TECHNIQUE: Multidetector CT imaging of the abdomen and pelvis was performed using the standard protocol following bolus administration of intravenous contrast. RADIATION DOSE REDUCTION: This exam was performed according to the departmental dose-optimization program which includes automated exposure control, adjustment of the mA and/or kV according to patient size and/or use of iterative reconstruction technique. CONTRAST:  141mL OMNIPAQUE IOHEXOL 300 MG/ML  SOLN COMPARISON:  May 18, 2013 FINDINGS: Lower chest: No acute abnormality. Hepatobiliary: No focal liver abnormality is seen. Status post cholecystectomy. No biliary dilatation. Pancreas: Fatty replaced.  Spleen: Normal in size without focal abnormality. Adrenals/Urinary Tract: Adrenal glands are unremarkable. Kidneys are normal, without renal calculi, focal lesion, or hydronephrosis. Bladder is unremarkable. Stomach/Bowel: Stomach is within normal limits. Appendix appears normal. No evidence of bowel wall thickening, distention, or inflammatory changes. Vascular/Lymphatic: Aortic atherosclerosis. No enlarged abdominal or pelvic lymph nodes. Reproductive: Distended heterogeneous endometrium measures 2.7 cm transversely. No adnexal masses seen. Other: No abdominal wall hernia or abnormality. No abdominopelvic ascites. Musculoskeletal: Mild posterior facet arthropathy of the lower lumbosacral spine. IMPRESSION: 1. No evidence of biliary obstruction. 2. Distended heterogeneous endometrium measures 2.7 cm in thickness. Clinical correlation is recommended. Consider tissue sampling. Aortic Atherosclerosis (ICD10-I70.0). Electronically Signed   By: Fidela Salisbury M.D.   On: 04/15/2022 17:10  ? ?MR 3D Recon At Scanner ? ?Result Date: 04/15/2022 ?CLINICAL DATA:  Biliary obstruction suspected EXAM: MRI ABDOMEN WITHOUT AND WITH CONTRAST (INCLUDING MRCP) TECHNIQUE: Multiplanar multisequence MR imaging of the abdomen was performed both before and after the administration of intravenous contrast. Heavily T2-weighted images of the biliary and pancreatic ducts were obtained, and three-dimensional MRCP images  were rendered by post processing. CONTRAST:  61mL GADAVIST GADOBUTROL 1 MMOL/ML IV SOLN COMPARISON:  Same day CT abdomen pelvis, 04/15/2022 FINDINGS: Lower chest: No acute findings.  Cardiomegaly. Hepatobiliary: Hepatomegaly, maximum coronal span 21.7 cm. No mass or other parenchymal abnormality identified. Status post cholecystectomy. Multiple stacked calculi within the central common bile duct, at least 5 or 6, measuring up to 0.5 cm (series 3, image 14). Moderate intrahepatic biliary ductal dilatation. Pancreas: No mass,  inflammatory changes, or other parenchymal abnormality identified.No pancreatic ductal dilatation. Spleen:  Within normal limits in size and appearance. Adrenals/Urinary Tract: Normal adrenal glands. No renal masses or

## 2022-04-17 ENCOUNTER — Inpatient Hospital Stay: Payer: Medicare HMO | Admitting: Radiology

## 2022-04-17 ENCOUNTER — Encounter: Payer: Self-pay | Admitting: Gastroenterology

## 2022-04-17 DIAGNOSIS — K8050 Calculus of bile duct without cholangitis or cholecystitis without obstruction: Secondary | ICD-10-CM | POA: Diagnosis not present

## 2022-04-17 HISTORY — PX: IR INT EXT BILIARY DRAIN WITH CHOLANGIOGRAM: IMG6044

## 2022-04-17 LAB — COMPREHENSIVE METABOLIC PANEL
ALT: 223 U/L — ABNORMAL HIGH (ref 0–44)
AST: 47 U/L — ABNORMAL HIGH (ref 15–41)
Albumin: 3 g/dL — ABNORMAL LOW (ref 3.5–5.0)
Alkaline Phosphatase: 328 U/L — ABNORMAL HIGH (ref 38–126)
Anion gap: 6 (ref 5–15)
BUN: 11 mg/dL (ref 8–23)
CO2: 26 mmol/L (ref 22–32)
Calcium: 8.9 mg/dL (ref 8.9–10.3)
Chloride: 106 mmol/L (ref 98–111)
Creatinine, Ser: 0.69 mg/dL (ref 0.44–1.00)
GFR, Estimated: >60 mL/min (ref >60)
Glucose, Bld: 200 mg/dL — ABNORMAL HIGH (ref 70–99)
Potassium: 4.2 mmol/L (ref 3.5–5.1)
Sodium: 138 mmol/L (ref 135–145)
Total Bilirubin: 2.6 mg/dL — ABNORMAL HIGH (ref 0.3–1.2)
Total Protein: 6.6 g/dL (ref 6.5–8.1)

## 2022-04-17 LAB — CBC WITH DIFFERENTIAL/PLATELET
Abs Immature Granulocytes: 0.02 10*3/uL (ref 0.00–0.07)
Basophils Absolute: 0 10*3/uL (ref 0.0–0.1)
Basophils Relative: 0 %
Eosinophils Absolute: 0 10*3/uL (ref 0.0–0.5)
Eosinophils Relative: 0 %
HCT: 39.3 % (ref 36.0–46.0)
Hemoglobin: 13.1 g/dL (ref 12.0–15.0)
Immature Granulocytes: 0 %
Lymphocytes Relative: 11 %
Lymphs Abs: 0.8 10*3/uL (ref 0.7–4.0)
MCH: 31.1 pg (ref 26.0–34.0)
MCHC: 33.3 g/dL (ref 30.0–36.0)
MCV: 93.3 fL (ref 80.0–100.0)
Monocytes Absolute: 0.2 10*3/uL (ref 0.1–1.0)
Monocytes Relative: 3 %
Neutro Abs: 6.2 10*3/uL (ref 1.7–7.7)
Neutrophils Relative %: 86 %
Platelets: 235 10*3/uL (ref 150–400)
RBC: 4.21 MIL/uL (ref 3.87–5.11)
RDW: 13.1 % (ref 11.5–15.5)
WBC: 7.2 10*3/uL (ref 4.0–10.5)
nRBC: 0 % (ref 0.0–0.2)

## 2022-04-17 LAB — PROTIME-INR
INR: 1.1 (ref 0.8–1.2)
Prothrombin Time: 13.6 seconds (ref 11.4–15.2)

## 2022-04-17 LAB — HIV ANTIBODY (ROUTINE TESTING W REFLEX): HIV Screen 4th Generation wRfx: NONREACTIVE

## 2022-04-17 LAB — HEPATITIS PANEL, ACUTE

## 2022-04-17 LAB — HCV INTERPRETATION

## 2022-04-17 MED ORDER — FENTANYL CITRATE (PF) 100 MCG/2ML IJ SOLN
INTRAMUSCULAR | Status: AC | PRN
  Administered 2022-04-17 (×2): 50 ug via INTRAVENOUS
  Administered 2022-04-17: 25 ug via INTRAVENOUS

## 2022-04-17 MED ORDER — FENTANYL CITRATE (PF) 100 MCG/2ML IJ SOLN
INTRAMUSCULAR | Status: AC
  Filled 2022-04-17: qty 2

## 2022-04-17 MED ORDER — MIDAZOLAM HCL 2 MG/2ML IJ SOLN
INTRAMUSCULAR | Status: AC
  Filled 2022-04-17: qty 4

## 2022-04-17 MED ORDER — MIDAZOLAM HCL 2 MG/2ML IJ SOLN
INTRAMUSCULAR | Status: AC | PRN
Start: 2022-04-17 — End: 2022-04-17
  Administered 2022-04-17: 1 mg via INTRAVENOUS
  Administered 2022-04-17: .5 mg via INTRAVENOUS
  Administered 2022-04-17: 1 mg via INTRAVENOUS
  Administered 2022-04-17: .5 mg via INTRAVENOUS

## 2022-04-17 MED ORDER — LIDOCAINE HCL 1 % IJ SOLN
INTRAMUSCULAR | Status: AC
  Administered 2022-04-17: 15 mL
  Filled 2022-04-17: qty 20

## 2022-04-17 MED ORDER — IOHEXOL 350 MG/ML SOLN
20.0000 mL | Freq: Once | INTRAVENOUS | Status: AC | PRN
  Administered 2022-04-17: 20 mL

## 2022-04-17 MED ORDER — ENOXAPARIN SODIUM 60 MG/0.6ML IJ SOSY
0.5000 mg/kg | PREFILLED_SYRINGE | INTRAMUSCULAR | Status: DC
  Administered 2022-04-17 – 2022-04-19 (×3): 60 mg via SUBCUTANEOUS
  Filled 2022-04-17 (×3): qty 0.6

## 2022-04-17 MED ORDER — SODIUM CHLORIDE 0.9 % IV SOLN
2.0000 g | INTRAVENOUS | Status: AC
  Administered 2022-04-17: 2 g via INTRAVENOUS
  Filled 2022-04-17 (×2): qty 2

## 2022-04-17 NOTE — Progress Notes (Addendum)
1206 ?Pt being transported to IR at this time ? ?1304 ?Verbal order with readback to advance pt diet to soft as she has tolerated clear and full without n/v ?

## 2022-04-17 NOTE — Progress Notes (Signed)
?PROGRESS NOTE ? ? ? ?[name]  BJS:[id number] DOB: 03-Feb-1956 DOA: 04/15/2022 ?PCP: Pcp, No  ? ? ?Brief Narrative:  ? 66 y.o. Caucasian female with medical history significant for previous cholecystectomy in 2005 and biliary stent for biliary duct injury, presented to the emergency room with acute onset of right upper quadrant abdominal pain and generalized weakness.  She initially developed nausea and vomiting as well as diarrhea 5 days ago that lasted about 24 hours.  After that she felt significantly weak that she slid out of bed last night without head injuries or syncope.  She admitted to persistent nausea with diminished appetite but no current vomiting or diarrhea.  No dysuria, oliguria, hematuria or flank pain.  She denies any fever or chills.  The patient was seen today in urgent care and was referred to the ED for elevated bilirubin in her urinalysis.  She denies any chest pain or dyspnea or cough or wheezing.  No dysuria, oliguria or hematuria or flank pain ? ?GI engaged from the emergency room.  Recommend MRCP.  MRCP shows several stacked stones within the common bile duct.  GI reengaged.  Plan for ERCP 4/24.  Patient NPO.  No signs of sepsis.  LFTs downtrending.  No signs of cholangitis. ? ?4/25: Patient underwent ERCP yesterday.  Case discussed at length with GI Dr. Allen Norris.  Very complex anatomy likely owing to previous cholecystectomy done in 2005.  Several stones retrieved however given unusual anatomy there is concern for a persistent retained stones.  As such patient is a high risk for development of cholangitis. ? ?Currently LFTs and bilirubin are downtrending but not yet normalized.  GI spoke with IR who will perform image guided PTC 4/25. ? ? ?Assessment & Plan: ?  ?Principal Problem: ?  Choledocholithiasis ?Active Problems: ?  Elevated LFTs ?  Hypokalemia ?  Hyponatremia ? ?Choledocholithiasis ?Elevated LFTs ?No signs of sepsis or acute cholangitis ?GI engaged ?Status post ERCP  4/24 ?Very complex surgical anatomy ?Abnormal connection suspected between cystic duct and duodenum ?GI recommends IR engagement for PTC ?IR consulted with plans for PTC/25 ?Plan: ?No indication for antibiotics at this time ?Monitor LFTs closely ?N.p.o. for now ?IR consulted ?PTC today ?As needed pain management ?As needed nausea management ? ?Hyponatremia ?Likely hypovolemic secondary to nausea and vomiting ?Improving ?Continue with IVF ?Daily CMP ? ?Hypokalemia ?Monitor and replace ? ?Morbid obesity ?BMI 50 ?This complicates overall care and prognosis ?Counseled patient on weight loss ? ?DVT prophylaxis: SQ Lovenox ?Code Status: Full ?Family Communication: None today ?Disposition Plan: Status is: Inpatient ?Remains inpatient appropriate because: Acute choledocholithiasis.  Complex surgical anatomy.  Status post ERCP.  Plan for PTC with IR today ? ?Level of care: Med-Surg ? ?Consultants:  ?GI ? ?Procedures:  ?ERCP 4/24 ? ?Antimicrobials: ?None ? ? ?Subjective: ?Seen and examined.  Resting comfortably in bed.  No visible distress.  No pain complaints.  Denies abdominal pain nausea or vomiting.  Endorses hunger ? ?Objective: ?Vitals:  ? 04/16/22 2008 04/16/22 2141 04/17/22 0359 04/17/22 0828  ?BP: (!) 153/62  137/61 (!) 124/58  ?Pulse: 70  (!) 52 (!) 51  ?Resp: 18  18 15   ?Temp: 97.9 ?F (36.6 ?C)  97.6 ?F (36.4 ?C) (!) 97.4 ?F (36.3 ?C)  ?TempSrc:  Oral    ?SpO2: 95%  96% 98%  ?Weight:      ?Height:      ? ? ?Intake/Output Summary (Last 24 hours) at 04/17/2022 1021 ?Last data filed at 04/16/2022 1748 ?Johney Maine  per 24 hour  ?Intake --  ?Output 0 ml  ?Net 0 ml  ? ?Filed Weights  ? 04/16/22 2008  ?Weight: 117.9 kg  ? ? ?Examination: ? ?General exam: No acute distress ?Respiratory system: Lungs clear.  Normal work of breathing.  Room air ?Cardiovascular system: S1-S2, RRR, no murmurs, no pedal edema ?Gastrointestinal system: Obese, soft, NT/ND, normal bowel sounds ?Central nervous system: Alert and oriented. No focal  neurological deficits. ?Extremities: Symmetric 5 x 5 power. ?Skin: No rashes, lesions or ulcers ?Psychiatry: Judgement and insight appear normal. Mood & affect appropriate.  ? ? ? ?Data Reviewed: I have personally reviewed following labs and imaging studies ? ?CBC: ?Recent Labs  ?Lab 04/15/22 ?1339 04/16/22 ?0500 04/17/22 ?0240  ?WBC 9.9 6.8 7.2  ?NEUTROABS  --   --  6.2  ?HGB 14.6 12.8 13.1  ?HCT 43.5 38.6 39.3  ?MCV 93.8 94.6 93.3  ?PLT 234 208 235  ? ?Basic Metabolic Panel: ?Recent Labs  ?Lab 04/15/22 ?1339 04/15/22 ?2227 04/16/22 ?0500 04/17/22 ?0240  ?NA 133*  --  139 138  ?K 3.3*  --  4.0 4.2  ?CL 99  --  108 106  ?CO2 27  --  24 26  ?GLUCOSE 117*  --  97 200*  ?BUN 10  --  7* 11  ?CREATININE 0.70  --  0.59 0.69  ?CALCIUM 9.1  --  8.9 8.9  ?MG  --  1.8  --   --   ? ?GFR: ?Estimated Creatinine Clearance: 82.5 mL/min (by C-G formula based on SCr of 0.69 mg/dL). ?Liver Function Tests: ?Recent Labs  ?Lab 04/15/22 ?1339 04/16/22 ?0500 04/17/22 ?0240  ?AST 196* 90* 47*  ?ALT 506* 297* 223*  ?ALKPHOS 300* 289* 328*  ?BILITOT 7.6* 5.2* 2.6*  ?PROT 7.7 6.3* 6.6  ?ALBUMIN 3.8 3.0* 3.0*  ? ?Recent Labs  ?Lab 04/15/22 ?1339  ?LIPASE 23  ? ?No results for input(s): AMMONIA in the last 168 hours. ?Coagulation Profile: ?Recent Labs  ?Lab 04/17/22 ?0831  ?INR 1.1  ? ?Cardiac Enzymes: ?No results for input(s): CKTOTAL, CKMB, CKMBINDEX, TROPONINI in the last 168 hours. ?BNP (last 3 results) ?No results for input(s): PROBNP in the last 8760 hours. ?HbA1C: ?No results for input(s): HGBA1C in the last 72 hours. ?CBG: ?No results for input(s): GLUCAP in the last 168 hours. ?Lipid Profile: ?No results for input(s): CHOL, HDL, LDLCALC, TRIG, CHOLHDL, LDLDIRECT in the last 72 hours. ?Thyroid Function Tests: ?No results for input(s): TSH, T4TOTAL, FREET4, T3FREE, THYROIDAB in the last 72 hours. ?Anemia Panel: ?No results for input(s): VITAMINB12, FOLATE, FERRITIN, TIBC, IRON, RETICCTPCT in the last 72 hours. ?Sepsis Labs: ?No results  for input(s): PROCALCITON, LATICACIDVEN in the last 168 hours. ? ?Recent Results (from the past 240 hour(s))  ?Culture, blood (Routine X 2) w Reflex to ID Panel     Status: None (Preliminary result)  ? Collection Time: 04/16/22 10:18 AM  ? Specimen: BLOOD  ?Result Value Ref Range Status  ? Specimen Description BLOOD RIGHT ANTECUBITAL  Final  ? Special Requests   Final  ?  BOTTLES DRAWN AEROBIC AND ANAEROBIC Blood Culture adequate volume  ? Culture   Final  ?  NO GROWTH < 24 HOURS ?Performed at [facility], [address] [place], [place] [address] ?  ? Report Status PENDING  Incomplete  ?Culture, blood (Routine X 2) w Reflex to ID Panel     Status: None (Preliminary result)  ? Collection Time: 04/16/22 10:18 AM  ? Specimen: BLOOD  ?  Result Value Ref Range Status  ? Specimen Description BLOOD BLOOD RIGHT HAND  Final  ? Special Requests   Final  ?  BOTTLES DRAWN AEROBIC AND ANAEROBIC Blood Culture adequate volume  ? Culture   Final  ?  NO GROWTH < 24 HOURS ?Performed at [facility], [address], [place], [place] [address] ?  ? Report Status PENDING  Incomplete  ?  ? ? ? ? ? ?Radiology Studies: ?CT Abdomen Pelvis W Contrast ? ?Result Date: 04/15/2022 ?CLINICAL DATA:  Suspected biliary obstruction.  Suprapubic pain. EXAM: CT ABDOMEN AND PELVIS WITH CONTRAST TECHNIQUE: Multidetector CT imaging of the abdomen and pelvis was performed using the standard protocol following bolus administration of intravenous contrast. RADIATION DOSE REDUCTION: This exam was performed according to the departmental dose-optimization program which includes automated exposure control, adjustment of the mA and/or kV according to patient size and/or use of iterative reconstruction technique. CONTRAST:  146m OMNIPAQUE IOHEXOL 300 MG/ML  SOLN COMPARISON:  May 18, 2013 FINDINGS: Lower chest: No acute abnormality. Hepatobiliary: No focal liver abnormality is seen. Status post cholecystectomy. No biliary dilatation. Pancreas:  Fatty replaced. Spleen: Normal in size without focal abnormality. Adrenals/Urinary Tract: Adrenal glands are unremarkable. Kidneys are normal, without renal calculi, focal lesion, or hydronephrosis. Bladder is unremar

## 2022-04-17 NOTE — Consult Note (Signed)
? ?Chief Complaint: ?Patient was seen in consultation today for biliary obstruction with choledocholithiasis the request of Dr. Servando Snare ? ?Referring Physician(s): ?Dr. Servando Snare ? ?Supervising Physician: Oley Balm ? ?Patient Status: [facility] - In-pt ? ?History of Present Illness: ?[name] is a 66 y.o. female with pertinent PMHx significant for acute calculous cholecystitis s/p laparoscopic cholecystectomy in 2005 here at [facility] with operative report stating "... the area of the porta hepatis was dissected. What appeared to be the cystic duct was delineated; closed proximally with a clip and opened. A cholangiocatheter was introduced. Could not get good dye flow without extravasation so the catheter was removed and that was closed distally.. dissection was continued up the gallbladder be. Encountered another ductal structure which was clipped. 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 it became apparently that there was likely a segment of common duct removed . It was felt at that time that the best procedure would be to close the patient as usual and arrange for transfer to one of the medical centers for repair." The patient was transferred [facility] per the daughter and repairs were done, the specifics of the repairs is unknown at this time.  ? ?The patient over the years has had intermittent abdominal pain but the pain has previously resolved. On 4/23 the patient presented to urgent care with complaints of RUQ abdominal pain, nausea, vomiting, diarrhea and weakness. Bilirubin was found in the urine sample and the patient was sent to the ED. Patient underwent imaging studies, MRCP that revealed multiple stacked calculi within central CBD and moderate intrahepatic biliary ductal dilatation. The patient's wbc wnl and total bilirubin elevated at 7.6, she underwent ERCP 4/24 with findings of the lower third of the main bile duct  containing filling defects thought to be stone. Distal CBD stump, a wire was passed into the biliary tree. A biliary sphincterotomy was made and swept with a few stones removed. Anatomy appears patient has a choledochoduodenostomy with a distal CBD stump remaining. Given patient's complex post surgical anatomy there is concern for persistent retained stones and high risk for developing cholangitis. IR received request for consult.  ? ?The patient denies any current abdominal pain and that her symptoms have resolved, she denies any nausea or vomiting on her current clear liquid diet, she denies any chest pain or shortness of breath. She denies any current blood thinner use, denies any known bleeding or clotting disorder. The patient denies any history of sleep apnea or chronic oxygen use. She has no known complications to sedation or iodinated contrast.  ? ?Past Medical History:  ?Diagnosis Date  ? Injury, blood vessel   ? ? ?Past Surgical History:  ?Procedure Laterality Date  ? CHOLECYSTECTOMY    ? ENDOVENOUS ABLATION SAPHENOUS VEIN W/ LASER Left 11/12/2017  ? endovenous laser ablation left greater saphenous vein by Josephina Gip MD   ? ERCP N/A 04/16/2022  ? Procedure: ENDOSCOPIC RETROGRADE CHOLANGIOPANCREATOGRAPHY (ERCP);  Surgeon: Midge Minium, MD;  Location: [facility] ENDOSCOPY;  Service: Endoscopy;  Laterality: N/A;  ? VASCULAR SURGERY    ? ? ?Allergies: ?Codeine, Septra ds [sulfamethoxazole-trimethoprim], Tramadol, and Sulfa antibiotics ? ?Medications: ?Prior to Admission medications   ?Not on File  ?  ? ?Family History  ?Problem Relation Age of Onset  ? Heart disease Mother   ? Diabetes Mother   ? Cancer Father   ? Heart disease Father   ? Hypertension  Father   ? ? ?Social History  ? ?Socioeconomic History  ? Marital status: Single  ?  Spouse name: Not on file  ? Number of children: Not on file  ? Years of education: Not on file  ? Highest education level: Not on file  ?Occupational History  ? Not on file  ?Tobacco  Use  ? Smoking status: Never  ? Smokeless tobacco: Never  ?Vaping Use  ? Vaping Use: Never used  ?Substance and Sexual Activity  ? Alcohol use: No  ? Drug use: No  ? Sexual activity: Not on file  ?Other Topics Concern  ? Not on file  ?Social History Narrative  ? Not on file  ? ?Social Determinants of Health  ? ?Financial Resource Strain: Not on file  ?Food Insecurity: Not on file  ?Transportation Needs: Not on file  ?Physical Activity: Not on file  ?Stress: Not on file  ?Social Connections: Not on file  ? ?Review of Systems: A 12 point ROS discussed and pertinent positives are indicated in the HPI above.  All other systems are negative. ? ?Review of Systems ? ?Vital Signs: ?BP (!) 124/58 (BP Location: Left Arm)   Pulse (!) 51   Temp (!) 97.4 ?F (36.3 ?C)   Resp 15   Ht 5' (1.524 m)   Wt 260 lb (117.9 kg)   SpO2 98%   BMI 50.78 kg/m?  ? ?Physical Exam ?Constitutional:   ?   General: She is not in acute distress. ?   Appearance: She is well-developed.  ?HENT:  ?   Head: Normocephalic and atraumatic.  ?Cardiovascular:  ?   Rate and Rhythm: Regular rhythm. Bradycardia present.  ?Pulmonary:  ?   Effort: Pulmonary effort is normal. No respiratory distress.  ?Abdominal:  ?   General: There is no distension.  ?   Palpations: Abdomen is soft.  ?   Tenderness: There is no abdominal tenderness.  ?Skin: ?   General: Skin is warm and dry.  ?Neurological:  ?   Mental Status: She is alert and oriented to person, place, and time.  ? ?Imaging: ?CT Abdomen Pelvis W Contrast ? ?Result Date: 04/15/2022 ?CLINICAL DATA:  Suspected biliary obstruction.  Suprapubic pain. EXAM: CT ABDOMEN AND PELVIS WITH CONTRAST TECHNIQUE: Multidetector CT imaging of the abdomen and pelvis was performed using the standard protocol following bolus administration of intravenous contrast. RADIATION DOSE REDUCTION: This exam was performed according to the departmental dose-optimization program which includes automated exposure control, adjustment of the  mA and/or kV according to patient size and/or use of iterative reconstruction technique. CONTRAST:  100mL OMNIPAQUE IOHEXOL 300 MG/ML  SOLN COMPARISON:  May 18, 2013 FINDINGS: Lower chest: No acute abnormality. Hepatobiliary: No focal liver abnormality is seen. Status post cholecystectomy. No biliary dilatation. Pancreas: Fatty replaced. Spleen: Normal in size without focal abnormality. Adrenals/Urinary Tract: Adrenal glands are unremarkable. Kidneys are normal, without renal calculi, focal lesion, or hydronephrosis. Bladder is unremarkable. Stomach/Bowel: Stomach is within normal limits. Appendix appears normal. No evidence of bowel wall thickening, distention, or inflammatory changes. Vascular/Lymphatic: Aortic atherosclerosis. No enlarged abdominal or pelvic lymph nodes. Reproductive: Distended heterogeneous endometrium measures 2.7 cm transversely. No adnexal masses seen. Other: No abdominal wall hernia or abnormality. No abdominopelvic ascites. Musculoskeletal: Mild posterior facet arthropathy of the lower lumbosacral spine. IMPRESSION: 1. No evidence of biliary obstruction. 2. Distended heterogeneous endometrium measures 2.7 cm in thickness. Clinical correlation is recommended. Consider tissue sampling. Aortic Atherosclerosis (ICD10-I70.0). Electronically Signed   By: Ted Mcalpineobrinka  Dimitrova  M.D.   On: 04/15/2022 17:10  ? ?MR 3D Recon At Scanner ? ?Result Date: 04/15/2022 ?CLINICAL DATA:  Biliary obstruction suspected EXAM: MRI ABDOMEN WITHOUT AND WITH CONTRAST (INCLUDING MRCP) TECHNIQUE: Multiplanar multisequence MR imaging of the abdomen was performed both before and after the administration of intravenous contrast. Heavily T2-weighted images of the biliary and pancreatic ducts were obtained, and three-dimensional MRCP images were rendered by post processing. CONTRAST:  24mL GADAVIST GADOBUTROL 1 MMOL/ML IV SOLN COMPARISON:  Same day CT abdomen pelvis, 04/15/2022 FINDINGS: Lower chest: No acute findings.   Cardiomegaly. Hepatobiliary: Hepatomegaly, maximum coronal span 21.7 cm. No mass or other parenchymal abnormality identified. Status post cholecystectomy. Multiple stacked calculi within the central common bile duct, a

## 2022-04-17 NOTE — Care Management (Signed)
Patient returned from interventional radiology.  Per GI interventional radiology found a hepatojejunostomy on PTC.  Anastomosis with stenosis noted at the surgical site.  Internal/external biliary drain placement, 8.5 [place].  Stent was placed.  Patient returned to medical floor. ? ?We will cautiously initiate diet.  Clear liquids for now, advance as tolerated to full liquid diet.  If LFTs downtrending and patient tolerating can advance diet from there. ? ?Ralene Muskrat MD ?

## 2022-04-17 NOTE — Procedures (Signed)
?  Procedure:  Perc transhepatic cholangiogram, int/ext biliary drain placement 8.66F ?Preprocedure diagnosis: The encounter diagnosis was Choledocholithiasis. ? ?Postprocedure diagnosis: same ?EBL:    minimal ?Complications:   none immediate ? ?See full dictation in [facility]. ? ?D. Oley Balm MD ?Main # [phone] ?Pager  [phone] ?Mobile [phone] ?  ? ?

## 2022-04-18 DIAGNOSIS — K8050 Calculus of bile duct without cholangitis or cholecystitis without obstruction: Secondary | ICD-10-CM | POA: Diagnosis not present

## 2022-04-18 LAB — COMPREHENSIVE METABOLIC PANEL
ALT: 170 U/L — ABNORMAL HIGH (ref 0–44)
AST: 48 U/L — ABNORMAL HIGH (ref 15–41)
Albumin: 2.8 g/dL — ABNORMAL LOW (ref 3.5–5.0)
Alkaline Phosphatase: 305 U/L — ABNORMAL HIGH (ref 38–126)
Anion gap: 4 — ABNORMAL LOW (ref 5–15)
BUN: 10 mg/dL (ref 8–23)
CO2: 27 mmol/L (ref 22–32)
Calcium: 8.7 mg/dL — ABNORMAL LOW (ref 8.9–10.3)
Chloride: 110 mmol/L (ref 98–111)
Creatinine, Ser: 0.69 mg/dL (ref 0.44–1.00)
GFR, Estimated: >60 mL/min (ref >60)
Glucose, Bld: 112 mg/dL — ABNORMAL HIGH (ref 70–99)
Potassium: 4.6 mmol/L (ref 3.5–5.1)
Sodium: 141 mmol/L (ref 135–145)
Total Bilirubin: 1.7 mg/dL — ABNORMAL HIGH (ref 0.3–1.2)
Total Protein: 6.3 g/dL — ABNORMAL LOW (ref 6.5–8.1)

## 2022-04-18 LAB — CBC WITH DIFFERENTIAL/PLATELET
Abs Immature Granulocytes: 0.03 10*3/uL (ref 0.00–0.07)
Basophils Absolute: 0 10*3/uL (ref 0.0–0.1)
Basophils Relative: 0 %
Eosinophils Absolute: 0 10*3/uL (ref 0.0–0.5)
Eosinophils Relative: 0 %
HCT: 38.6 % (ref 36.0–46.0)
Hemoglobin: 12.4 g/dL (ref 12.0–15.0)
Immature Granulocytes: 0 %
Lymphocytes Relative: 30 %
Lymphs Abs: 2.6 10*3/uL (ref 0.7–4.0)
MCH: 30.8 pg (ref 26.0–34.0)
MCHC: 32.1 g/dL (ref 30.0–36.0)
MCV: 96 fL (ref 80.0–100.0)
Monocytes Absolute: 0.6 10*3/uL (ref 0.1–1.0)
Monocytes Relative: 7 %
Neutro Abs: 5.5 10*3/uL (ref 1.7–7.7)
Neutrophils Relative %: 63 %
Platelets: 241 10*3/uL (ref 150–400)
RBC: 4.02 MIL/uL (ref 3.87–5.11)
RDW: 13.7 % (ref 11.5–15.5)
WBC: 8.7 10*3/uL (ref 4.0–10.5)
nRBC: 0 % (ref 0.0–0.2)

## 2022-04-18 NOTE — Progress Notes (Signed)
? ? ?MEDICINE DAILY PROGRESS NOTE ? ?Patient: [name] 66 y.o. female ?MRN: [id number] ? ?Today is hospital day 3 after presenting to ED on 04/15/2022  2:35 PM with  ?Chief Complaint  ?Patient presents with  ? Abdominal Pain  ? ? ? ?RECORD REVIEW AND HOSPITAL COURSE: ? 66 y.o. Caucasian female with medical history significant for previous cholecystectomy in 2005 and biliary stent for biliary duct injury, presented to the emergency room with acute onset of right upper quadrant abdominal pain and generalized weakness.  She initially developed nausea and vomiting as well as diarrhea 5 days ago that lasted about 24 hours referred to the ED for elevated bilirubin in her urinalysis at [facility], GI engaged from the emergency room.  Recommend MRCP, shows several stacked stones within the common bile duct.  GI reengaged.  Plan for ERCP 4/24.  Patient NPO.  No signs of sepsis.  LFTs downtrending.  No signs of cholangitis. ?  ?4/24: Patient underwent ERCP.    Very complex anatomy likely owing to previous cholecystectomy done in 2005.  Several stones retrieved however given unusual anatomy there is concern for a persistent retained stones.  As such patient is a high risk for development of cholangitis. ?  ?LFTs and bilirubin are downtrending but not yet normalized.  GI spoke with IR who will perform image guided PTC 4/25 which showed Percutaneous transhepatic cholangiogram, int/ext biliary drain placement 8.2F on 04/17/22 (+)hepatojejunostomy, anastomosis with stenosis noted at the surgical site.  Internal/external biliary drain placement, 8.5 [place].  Stent was placed.  today 04/18/22 pt tolerating diet, has drain in place  ? ?Procedures and Significant Results:  ?04/16/22 ERCP w/ stone removal  ?04/17/22 Percutaneous transhepatic cholangiogram, int/ext biliary drain placement 8.2F on 04/17/22 (+)hepatojejunostomy, anastomosis with stenosis noted at the surgical site. Internal/external biliary drain  placement ? ?Consultants:  ?GI ?IR ? ? ? ?SUBJECTIVE:  ?Pt still having some abdominal pain this AM but better overall. No N/V, able to tolerate grits this AM but some nausea w/ this. No fever. Has questions about drain and plan for this going forward.  ? ? ? ? ?ASSESSMENT & PLAN ? ?Choledocholithiasis ?ERCP on 04/15/22 removed stones ?Percutaneous transhepatic cholangiogram, int/ext biliary drain placement 8.2F on 04/17/22 (+)hepatojejunostomy, anastomosis with stenosis noted at the surgical site.  Internal/external biliary drain placement, 8.5 [place].  Stent was placed.   ?Clear liquids to advance as tolerated ?Med-Surg dispo at the moment ?Pain management  ?PRN antiemetics ?Appreciate GI recs re: plan for discharge, I believe she will just need [facility] nurse to monitor the drain vs close outpatient follow-up in office w/ GI  ? ?Elevated LFTs ?D/t Obstructive choledocholithiasis. ?Follow LFTs. ? ?Hypokalemia ?Resolved ?Follow BMP ? ?Hyponatremia ?resolved with IV normal saline with added potassium chloride  ?follow BMP ? ? ?VTE Ppx: Lovenox ?CODE STATUS: FULL ?Admitted from: home ?Expected Dispo: home +/- home health ?Barriers to discharge: GI recommendations as noted above, may need HH services, needs to toelrate po overnight but mya be able to d/c tomorrow AM 04/27 ?Family communication: daughter at bedside ? ? ? ? ? ? ? ? ? ? ? ? ? ?Objective: ?Vital signs in last 24 hours: ?Temp:  [97.4 ?F (36.3 ?C)-98.3 ?F (36.8 ?C)] 97.7 ?F (36.5 ?C) (04/26 0623) ?Pulse Rate:  [51-78] 57 (04/26 0803) ?Resp:  [15-24] 18 (04/26 0803) ?BP: (107-156)/(50-93) 134/72 (04/26 0803) ?SpO2:  [95 %-100 %] 100 % (04/26 0803) ?Weight change:  ?Last BM Date : 04/15/22 ? ?Intake/Output from previous  day: ?04/25 0701 - 04/26 0700 ?In: 1927.5 [I.V.:1827.5; IV Piggyback:100] ?Out: 500 [Drains:500] ?Intake/Output this shift: ?No intake/output data recorded. ? ?Constitutional:  ?VSS, see nurse notes ?General Appearance: alert, well-developed,  well-nourished, NAD ?Neck: ?No masses, trachea midline ?Respiratory: ?Normal respiratory effort ?Breath sounds normal, no wheeze/rhonchi/rales ?Cardiovascular: ?S1/S2 normal, no murmur/rub/gallop auscultated ?No lower extremity edema ?Gastrointestinal: ?No masses ?Drain in place from RUQ draining clear/cloudy brown fluid  ?Musculoskeletal:  ?No clubbing/cyanosis of digits ?Neurological: ?No cranial nerve deficit on limited exam ?Psychiatric: ?Normal judgment/insight ?Normal mood and affect ? ? ?Lab Results: ?Recent Labs  ?  04/17/22 ?0240 04/18/22 ?1761  ?WBC 7.2 8.7  ?HGB 13.1 12.4  ?HCT 39.3 38.6  ?PLT 235 241  ? ?BMET ?Recent Labs  ?  04/17/22 ?0240 04/18/22 ?6073  ?NA 138 141  ?K 4.2 4.6  ?CL 106 110  ?CO2 26 27  ?GLUCOSE 200* 112*  ?BUN 11 10  ?CREATININE 0.69 0.69  ?CALCIUM 8.9 8.7*  ? ? ?Studies/Results: ?DG C-Arm 1-60 Min ? ?Result Date: 04/17/2022 ?CLINICAL DATA:  Choledocholithiasis, previous cholecystectomy EXAM: ERCP TECHNIQUE: Multiple spot images obtained with the fluoroscopic device and submitted for interpretation post-procedure. FLUOROSCOPY: Radiation Exposure Index (as provided by the fluoroscopic device): 118.98 mGy Kerma COMPARISON:  MR and CT from previous day FINDINGS: Series of fluoroscopic spot images document endoscopic cannulation and opacification of the distal CBD. Filling defects are noted within the lumen on early image. Subsequent images document balloon catheter sweep. There is abrupt cut off in the mid CBD. At least 2 surgical clips overlie the approximate proximal course of the CBD on this single projection. The proximal CBD and intrahepatic biliary tree are not opacified. IMPRESSION: 1. Endoscopic CBD cannulation and intervention as above 2. Occlusion of the mid CBD with filling defects in the distal segment. 3. Non opacification of the intrahepatic biliary tree or proximal CBD. These images were submitted for radiologic interpretation only. Please see the procedural report for the  amount of contrast and the fluoroscopy time utilized. Electronically Signed   By: Lucrezia Europe M.D.   On: 04/17/2022 12:40   ? ?Medications: I have reviewed the patient's current medications. ?Prior to Admission:  ?No medications prior to admission.  ? ?Scheduled: ? enoxaparin (LOVENOX) injection  0.5 mg/kg Subcutaneous Q24H  ? ?Continuous: ? 0.9 % NaCl with KCl 40 mEq / L 100 mL/hr at 04/17/22 2318  ? ?XTG:GYIRSWNIOEVOJ **OR** acetaminophen, LORazepam, magnesium hydroxide, ondansetron **OR** ondansetron (ZOFRAN) IV, traZODone ?Anti-infectives (From admission, onward)  ? ? Start     Dose/Rate Route Frequency Ordered Stop  ? 04/17/22 1100  cefOXitin (MEFOXIN) 2 g in sodium chloride 0.9 % 100 mL IVPB       ? 2 g ?200 mL/hr over 30 Minutes Intravenous To Radiology 04/17/22 0945 04/17/22 1321  ? 04/16/22 0900  piperacillin-tazobactam (ZOSYN) IVPB 3.375 g  Status:  Discontinued       ? 3.375 g ?12.5 mL/hr over 240 Minutes Intravenous Every 8 hours 04/16/22 0852 04/16/22 1119  ? ?  ? ? ? LOS: 3 days  ? ?Emeterio Reeve ?04/18/2022, 8:18 AM ? ? ?

## 2022-04-19 ENCOUNTER — Other Ambulatory Visit: Payer: Self-pay | Admitting: Student

## 2022-04-19 DIAGNOSIS — K8050 Calculus of bile duct without cholangitis or cholecystitis without obstruction: Secondary | ICD-10-CM | POA: Diagnosis not present

## 2022-04-19 LAB — CBC WITH DIFFERENTIAL/PLATELET
Abs Immature Granulocytes: 0.01 10*3/uL (ref 0.00–0.07)
Basophils Absolute: 0 10*3/uL (ref 0.0–0.1)
Basophils Relative: 1 %
Eosinophils Absolute: 0.1 10*3/uL (ref 0.0–0.5)
Eosinophils Relative: 1 %
HCT: 40.5 % (ref 36.0–46.0)
Hemoglobin: 12.9 g/dL (ref 12.0–15.0)
Immature Granulocytes: 0 %
Lymphocytes Relative: 34 %
Lymphs Abs: 2.2 10*3/uL (ref 0.7–4.0)
MCH: 30.7 pg (ref 26.0–34.0)
MCHC: 31.9 g/dL (ref 30.0–36.0)
MCV: 96.4 fL (ref 80.0–100.0)
Monocytes Absolute: 0.5 10*3/uL (ref 0.1–1.0)
Monocytes Relative: 8 %
Neutro Abs: 3.6 10*3/uL (ref 1.7–7.7)
Neutrophils Relative %: 56 %
Platelets: 270 10*3/uL (ref 150–400)
RBC: 4.2 MIL/uL (ref 3.87–5.11)
RDW: 13.6 % (ref 11.5–15.5)
WBC: 6.4 10*3/uL (ref 4.0–10.5)
nRBC: 0 % (ref 0.0–0.2)

## 2022-04-19 LAB — COMPREHENSIVE METABOLIC PANEL
ALT: 130 U/L — ABNORMAL HIGH (ref 0–44)
AST: 44 U/L — ABNORMAL HIGH (ref 15–41)
Albumin: 3 g/dL — ABNORMAL LOW (ref 3.5–5.0)
Alkaline Phosphatase: 273 U/L — ABNORMAL HIGH (ref 38–126)
Anion gap: 6 (ref 5–15)
BUN: 8 mg/dL (ref 8–23)
CO2: 26 mmol/L (ref 22–32)
Calcium: 9.2 mg/dL (ref 8.9–10.3)
Chloride: 108 mmol/L (ref 98–111)
Creatinine, Ser: 0.6 mg/dL (ref 0.44–1.00)
GFR, Estimated: >60 mL/min (ref >60)
Glucose, Bld: 110 mg/dL — ABNORMAL HIGH (ref 70–99)
Potassium: 4.6 mmol/L (ref 3.5–5.1)
Sodium: 140 mmol/L (ref 135–145)
Total Bilirubin: 1.2 mg/dL (ref 0.3–1.2)
Total Protein: 6.5 g/dL (ref 6.5–8.1)

## 2022-04-19 MED ORDER — SODIUM CHLORIDE 0.9% FLUSH
5.0000 mL | Freq: Three times a day (TID) | INTRAVENOUS | Status: DC
  Administered 2022-04-19: 5 mL

## 2022-04-19 MED ORDER — SODIUM CHLORIDE 0.9 % IJ SOLN: INTRAMUSCULAR | 2 refills | Status: AC

## 2022-04-19 MED ORDER — HYDROCODONE-ACETAMINOPHEN 5-325 MG PO TABS: 1.0000 | ORAL_TABLET | ORAL | Status: DC | PRN

## 2022-04-19 NOTE — Care Management (Deleted)
Pre-rounds chart review and preliminary plan / goals for today: ?Significant overnight events per chart: none ?Significant new findings on labs/other diagnostics: none ?Dispo plan: may be able to d/c today, TOC consult placed to help arrange management of her drain (GI has signed off, they recommend [facility] interventional GI follow-up) ?Pending/Plan: TOC help w/ outpatient f/u for drain care, i've asked IR via secure chat if they have anything to add - patient and family had expressed concern about this drain  ? ?Please feel free to reach out via secure chat in Epic for non-urgent issues  ?Please page for urgent matters! ? ?This note will be updated after rounds to full progress note / discharge note.  ? ?

## 2022-04-19 NOTE — TOC Initial Note (Addendum)
Transition of Care (TOC) - Initial/Assessment Note  ? ? ?Patient Details  ?Name: [place] A Toothaker ?MRN: [id number] ?Date of Birth: 19-May-1956 ? ?Transition of Care (TOC) CM/SW Contact:    ?Kyzer Blowe A Briell Paulette, LCSW ?Phone Number: ?04/19/2022, 2:50 PM ? ?Clinical Narrative:   CSW spoke with Britta Mccreedy pt's sister. Britta Mccreedy states pt will only need [facility] RN for drain management. PT's daughter states pt doesn't need any DME.               ? ?Kandee Keen with [facility] has accepted referral.  ? ?Expected Discharge Plan: Home w Home Health Services ?Barriers to Discharge: No Barriers Identified ? ? ?Patient Goals and CMS Choice ?Patient states their goals for this hospitalization and ongoing recovery are:: for pt to return home ?  ?  ? ?Expected Discharge Plan and Services ?Expected Discharge Plan: Home w Home Health Services ?In-house Referral: Clinical Social Work ?  ?Post Acute Care Choice: Home Health ?Living arrangements for the past 2 months: Single Family Home ?Expected Discharge Date: 04/19/22               ?  ?  ?  ?  ?  ?HH Arranged: RN ?HH Agency: [facility] ?Date HH Agency Contacted: 04/19/22 ?Time HH Agency Contacted: [phone] ?Representative spoke with at [facility] Agency: kelsey ? ?Prior Living Arrangements/Services ?Living arrangements for the past 2 months: Single Family Home ?Lives with:: Self ?Patient language and need for interpreter reviewed:: Yes ?Do you feel safe going back to the place where you live?: Yes      ?Need for Family Participation in Patient Care: Yes (Comment) ?Care giver support system in place?: Yes (comment) ?  ?Criminal Activity/Legal Involvement Pertinent to Current Situation/Hospitalization: No - Comment as needed ? ?Activities of Daily Living ?Home Assistive Devices/Equipment: Eyeglasses, Dentures (specify type), Hearing aid ?ADL Screening (condition at time of admission) ?Patient's cognitive ability adequate to safely complete daily activities?: Yes ?Is the patient deaf or have difficulty hearing?:  No ?Does the patient have difficulty seeing, even when wearing glasses/contacts?: No ?Does the patient have difficulty concentrating, remembering, or making decisions?: No ?Patient able to express need for assistance with ADLs?: Yes ?Does the patient have difficulty dressing or bathing?: No ?Independently performs ADLs?: Yes (appropriate for developmental age) ?Does the patient have difficulty walking or climbing stairs?: No ?Weakness of Legs: None ?Weakness of Arms/Hands: None ? ?Permission Sought/Granted ?Permission sought to share information with : Family Supports ?  ? Share Information with NAME: Britta Mccreedy ?   ? Permission granted to share info w Relationship: daughter ?   ? ?Emotional Assessment ?Appearance:: Appears stated age ?Attitude/Demeanor/Rapport: Unable to Assess ?Affect (typically observed): Unable to Assess ?Orientation: : Oriented to Self, Oriented to Place, Oriented to  Time, Oriented to Situation ?Alcohol / Substance Use: Not Applicable ?Psych Involvement: No (comment) ? ?Admission diagnosis:  Choledocholithiasis [K80.50] ?Patient Active Problem List  ? Diagnosis Date Noted  ? Choledocholithiasis 04/15/2022  ? Elevated LFTs 04/15/2022  ? Hypokalemia 04/15/2022  ? Hyponatremia 04/15/2022  ? Varicose veins of left lower extremity with complications 10/28/2017  ? Venous ulcer of left leg (HCC) 02/09/2016  ? ?PCP:  Pcp, No ?Pharmacy:   ?[id number] - [place], [place] - [address] [place] [address] ?[address] [place] [address] ?[place] [address] ?Phone: [phone] Fax: [phone] ? ?[facility]  ?[address] ?[place] [address] ?Phone: [phone] Fax: [phone] ? ? ? ? ?Social Determinants of Health (SDOH) Interventions ?  ? ?Readmission  Risk Interventions ?   ? View : No data to display.  ?  ?  ?  ? ? ? ?

## 2022-04-19 NOTE — Care Management Important Message (Signed)
Important Message ? ?Patient Details  ?Name: [name] ?MRN: [id number] ?Date of Birth: 1956-05-02 ? ? ?Medicare Important Message Given:  Yes ? ? ? ? ?[name] ?04/19/2022, 11:26 AM ?

## 2022-04-19 NOTE — Progress Notes (Signed)
? ? ?Referring Physician(s): Midge Minium, MD ? ?Supervising Physician: Oley Balm ? ?Patient Status:  [facility] - In-pt ? ?Chief Complaint: Follow up internal/external biliary drain placement 04/17/22 in IR ? ?Subjective: ? ?Patient seen at bedside, hopeful to go home today and wondering about drain care/how long drain will need to stay in. She denies any abdominal pain, had some nausea with breakfast but feels better now, no vomiting. Drain has been working well to her knowledge. ? ?Discussed procedure, indications for referral to tertiary care center and drain care at bedside today.  ? ?Allergies: ?Codeine, Septra ds [sulfamethoxazole-trimethoprim], Tramadol, and Sulfa antibiotics ? ?Medications: ?Prior to Admission medications   ?Not on File  ? ? ? ?Vital Signs: ?BP (!) 137/47 (BP Location: Left Arm)   Pulse (!) 59   Temp 98.6 ?F (37 ?C)   Resp 18   Ht 5' (1.524 m)   Wt 260 lb (117.9 kg)   SpO2 97%   BMI 50.78 kg/m?  ? ?Physical Exam ?Vitals and nursing note reviewed.  ?Constitutional:   ?   General: She is not in acute distress. ?HENT:  ?   Head: Normocephalic.  ?Cardiovascular:  ?   Rate and Rhythm: Tachycardia present.  ?Pulmonary:  ?   Effort: Pulmonary effort is normal.  ?Abdominal:  ?   General: There is no distension.  ?   Palpations: Abdomen is soft.  ?   Tenderness: There is abdominal tenderness (mildly TTP aorund drain inserstion site (appropriate for recent procedur)).  ?   Comments: (+) RUQ drain to gravity with ~100 cc clear, bilious output. No frank blood or purulent material noted in bag. Insertion site clean, dry, dressed appropriately. Suture and stat lock in place. Drain flushed easily with 5 mL NS, no pain or leakage noted with flushing.  ?Skin: ?   General: Skin is warm and dry.  ?   Coloration: Skin is not jaundiced.  ?Neurological:  ?   Mental Status: She is alert. Mental status is at baseline.  ? ? ?Imaging: ?CT Abdomen Pelvis W Contrast ? ?Result Date: 04/15/2022 ?CLINICAL DATA:   Suspected biliary obstruction.  Suprapubic pain. EXAM: CT ABDOMEN AND PELVIS WITH CONTRAST TECHNIQUE: Multidetector CT imaging of the abdomen and pelvis was performed using the standard protocol following bolus administration of intravenous contrast. RADIATION DOSE REDUCTION: This exam was performed according to the departmental dose-optimization program which includes automated exposure control, adjustment of the mA and/or kV according to patient size and/or use of iterative reconstruction technique. CONTRAST:  OMNIPAQUE IOHEXOL 300 MG/ML  SOLN COMPARISON:  May 18, 2013 FINDINGS: Lower chest: No acute abnormality. Hepatobiliary: No focal liver abnormality is seen. Status post cholecystectomy. No biliary dilatation. Pancreas: Fatty replaced. Spleen: Normal in size without focal abnormality. Adrenals/Urinary Tract: Adrenal glands are unremarkable. Kidneys are normal, without renal calculi, focal lesion, or hydronephrosis. Bladder is unremarkable. Stomach/Bowel: Stomach is within normal limits. Appendix appears normal. No evidence of bowel wall thickening, distention, or inflammatory changes. Vascular/Lymphatic: Aortic atherosclerosis. No enlarged abdominal or pelvic lymph nodes. Reproductive: Distended heterogeneous endometrium measures 2.7 cm transversely. No adnexal masses seen. Other: No abdominal wall hernia or abnormality. No abdominopelvic ascites. Musculoskeletal: Mild posterior facet arthropathy of the lower lumbosacral spine. IMPRESSION: 1. No evidence of biliary obstruction. 2. Distended heterogeneous endometrium measures 2.7 cm in thickness. Clinical correlation is recommended. Consider tissue sampling. Aortic Atherosclerosis (ICD10-I70.0). Electronically Signed   By: Ted Mcalpine M.D.   On: 04/15/2022 17:10  ? ?MR 3D Recon At Scanner ? ?  Result Date: 04/15/2022 ?CLINICAL DATA:  Biliary obstruction suspected EXAM: MRI ABDOMEN WITHOUT AND WITH CONTRAST (INCLUDING MRCP) TECHNIQUE: Multiplanar  multisequence MR imaging of the abdomen was performed both before and after the administration of intravenous contrast. Heavily T2-weighted images of the biliary and pancreatic ducts were obtained, and three-dimensional MRCP images were rendered by post processing. CONTRAST:  10mL GADAVIST GADOBUTROL 1 MMOL/ML IV SOLN COMPARISON:  Same day CT abdomen pelvis, 04/15/2022 FINDINGS: Lower chest: No acute findings.  Cardiomegaly. Hepatobiliary: Hepatomegaly, maximum coronal span 21.7 cm. No mass or other parenchymal abnormality identified. Status post cholecystectomy. Multiple stacked calculi within the central common bile duct, at least 5 or 6, measuring up to 0.5 cm (series 3, image 14). Moderate intrahepatic biliary ductal dilatation. Pancreas: No mass, inflammatory changes, or other parenchymal abnormality identified.No pancreatic ductal dilatation. Spleen:  Within normal limits in size and appearance. Adrenals/Urinary Tract: Normal adrenal glands. No renal masses or suspicious contrast enhancement identified. No evidence of hydronephrosis. Stomach/Bowel: Visualized portions within the abdomen are unremarkable. Vascular/Lymphatic: No pathologically enlarged lymph nodes identified. No abdominal aortic aneurysm demonstrated. Other:  None. Musculoskeletal: No suspicious osseous lesions identified. IMPRESSION: 1. Multiple stacked calculi within the central common bile duct, measuring up to 0.5 cm. Moderate intrahepatic biliary ductal dilatation. 2. Status post cholecystectomy. 3. Hepatomegaly. Electronically Signed   By: Jearld LeschAlex D Bibbey M.D.   On: 04/15/2022 18:53  ? ?DG C-Arm 1-60 Min ? ?Result Date: 04/17/2022 ?CLINICAL DATA:  Choledocholithiasis, previous cholecystectomy EXAM: ERCP TECHNIQUE: Multiple spot images obtained with the fluoroscopic device and submitted for interpretation post-procedure. FLUOROSCOPY: Radiation Exposure Index (as provided by the fluoroscopic device): 118.98 mGy Kerma COMPARISON:  MR and CT from  previous day FINDINGS: Series of fluoroscopic spot images document endoscopic cannulation and opacification of the distal CBD. Filling defects are noted within the lumen on early image. Subsequent images document balloon catheter sweep. There is abrupt cut off in the mid CBD. At least 2 surgical clips overlie the approximate proximal course of the CBD on this single projection. The proximal CBD and intrahepatic biliary tree are not opacified. IMPRESSION: 1. Endoscopic CBD cannulation and intervention as above 2. Occlusion of the mid CBD with filling defects in the distal segment. 3. Non opacification of the intrahepatic biliary tree or proximal CBD. These images were submitted for radiologic interpretation only. Please see the procedural report for the amount of contrast and the fluoroscopy time utilized. Electronically Signed   By: Corlis Leak  Hassell M.D.   On: 04/17/2022 12:40  ? ?MR ABDOMEN MRCP W WO CONTAST ? ?Result Date: 04/15/2022 ?CLINICAL DATA:  Biliary obstruction suspected EXAM: MRI ABDOMEN WITHOUT AND WITH CONTRAST (INCLUDING MRCP) TECHNIQUE: Multiplanar multisequence MR imaging of the abdomen was performed both before and after the administration of intravenous contrast. Heavily T2-weighted images of the biliary and pancreatic ducts were obtained, and three-dimensional MRCP images were rendered by post processing. CONTRAST:  10mL GADAVIST GADOBUTROL 1 MMOL/ML IV SOLN COMPARISON:  Same day CT abdomen pelvis, 04/15/2022 FINDINGS: Lower chest: No acute findings.  Cardiomegaly. Hepatobiliary: Hepatomegaly, maximum coronal span 21.7 cm. No mass or other parenchymal abnormality identified. Status post cholecystectomy. Multiple stacked calculi within the central common bile duct, at least 5 or 6, measuring up to 0.5 cm (series 3, image 14). Moderate intrahepatic biliary ductal dilatation. Pancreas: No mass, inflammatory changes, or other parenchymal abnormality identified.No pancreatic ductal dilatation. Spleen:   Within normal limits in size and appearance. Adrenals/Urinary Tract: Normal adrenal glands. No renal masses or suspicious contrast enhancement identified.  No evidence of hydronephrosis. Stomach/Bowel: Visualized porti

## 2022-04-19 NOTE — Discharge Summary (Signed)
?Physician Discharge Summary ?  ?Patient: [name] MRN: [id number] DOB: Sep 15, 1956  ?Admit date:     04/15/2022  ?Discharge date: 04/19/22  ?Discharge Physician: Emeterio Reeve  ? ?PCP: Pcp, No  ? ?Recommendations at discharge:  ?Follow up with [facility] as directed, order placed for staff here to arrange appointment  ?Drain care as instructed by IR  ?Follow with PCP in 1-2 weeks  make sure hepatic labs still trending down  ? ?Discharge Diagnoses: ?Principal Problem: ?  Choledocholithiasis ?Active Problems: ?  Elevated LFTs ?  Hypokalemia ?  Hyponatremia ? ?Resolved Problems: ?  * No resolved hospital problems. * ? ?Hospital Course: ? ? 66 y.o. Caucasian female with medical history significant for previous cholecystectomy in 2005 and biliary stent for biliary duct injury, presented to the emergency room with acute onset of right upper quadrant abdominal pain and generalized weakness.  She initially developed nausea and vomiting as well as diarrhea 5 days ago that lasted about 24 hours referred to the ED for elevated bilirubin in her urinalysis at [facility], GI engaged from the emergency room.  Recommend MRCP, shows several stacked stones within the common bile duct.  GI reengaged.  Plan for ERCP 4/24.  Patient NPO.  No signs of sepsis.  LFTs downtrending.  No signs of cholangitis. ?  ?4/24: Patient underwent ERCP.    Very complex anatomy likely owing to previous cholecystectomy done in 2005.  Several stones retrieved however given unusual anatomy there is concern for a persistent retained stones.  As such patient is a high risk for development of cholangitis. ?  ?LFTs and bilirubin are downtrending but not yet normalized.  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  Internal/external biliary drain placement, 8.5 [place].   Stent was placed.  04/18/22 pt tolerating diet, has drain in place\. Continued to do well into 04/19/22 and educated on drain care per IR, GI has signed off.  ?  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 Internal/external biliary drain placement ?  ?Consultants:  ?GI ?IR ? ? ? ? ?Assessment and Plan: ? 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  Internal/external biliary drain placement, 8.5 [place].  Stent was placed.   ?Clear liquids to advance as tolerated ?Drain care per IR - pt was educated today by [employer] PA ? ?Hyponatremia ?resolved with IV normal saline with added potassium chloride  ?follow BMP ? ?Hypokalemia ?Resolved ?Follow BMP ? ?Elevated LFTs ?D/t Obstructive choledocholithiasis. ?Follow LFTs outpatient but these have been trending down  ? ? ? ? ?  ? ?Disposition: Home ?Diet recommendation:  ?Discharge Diet Orders (From admission, onward)  ? ?  Start     Ordered  ? 04/19/22 0000  Diet - low sodium heart healthy       ? 04/19/22 1353  ? ?  ?  ? ?  ? ?Clear liquid diet, advance as tolerated ?DISCHARGE MEDICATION: ?Allergies as of 04/19/2022   ? ?   Reactions  ? Codeine   ? Other reaction(s): Other (See Comments) ?Stomach burning  ? Septra Ds [sulfamethoxazole-trimethoprim] Other (See Comments)  ? Nausea  And stomach burning  ? Tramadol Other (See Comments)  ? Makes me feel loopy    ? Sulfa Antibiotics   ? Other reaction(s): Unknown ?As a child  ? ?  ? ?  ?Medication List  ?  ? ?TAKE these medications   ? ?sodium chloride 0.9 % injection ?Instill 5 mL into drain every 2-3 days. ?  ? ?  ? ?  ?  ? ? ?  ?Discharge Care Instructions   ?(From admission, onward)  ?  ? ? ?  ? ?  Start     Ordered  ? 04/19/22 0000  Discharge wound care:       ?Comments: As directed by Interventional Radiology  ? 04/19/22 1353  ? ?  ?  ? ?  ? ? Follow-up Information   ? ? Arne Cleveland, MD Follow up.   ?Specialties: Interventional Radiology, Radiology ?Why: No routine IR follow up is needed at this time - please follow up with [facility] regarding timing of drain removal. Please call us with any questions or concers at [phone] Monday - Friday between 8a-5p or [phone] if after hours. ?Contact information: ?[address] ?[address] ?[place] [address] ?[phone] ? ? ?  ?  ? ?  ?  ? ?  ? ?Discharge Exam: ?Danley Danker Weights  ? 04/16/22 2008  ?Weight: 117.9 kg  ? ?Constitutional:  ?VSS, see nurse notes ?General Appearance: alert, well-developed, well-nourished, NAD ?Neck: ?No masses, trachea midline ?Respiratory: ?Normal respiratory effort ?Breath sounds normal, no wheeze/rhonchi/rales ?Cardiovascular: ?S1/S2 normal, no murmur/rub/gallop auscultated ?No lower extremity edema ?Gastrointestinal: ?No masses ?Drain in place from RUQ draining clear/cloudy brown fluid  ?Musculoskeletal:  ?No clubbing/cyanosis of digits ?Neurological: ?No cranial nerve deficit on limited exam ?Psychiatric: ?Normal judgment/insight ?Normal mood and affect ? ?Condition at discharge: good ? ?The results of significant diagnostics from this hospitalization (including imaging, microbiology, ancillary and laboratory) are listed below for reference.  ? ?Imaging Studies: ?CT Abdomen Pelvis W Contrast ? ?Result Date: 04/15/2022 ?CLINICAL DATA:  Suspected biliary obstruction.  Suprapubic pain. EXAM: CT ABDOMEN AND PELVIS WITH CONTRAST TECHNIQUE: Multidetector CT imaging of the abdomen and pelvis was performed using the standard protocol following bolus administration of intravenous contrast. RADIATION DOSE REDUCTION: This exam was performed according to the departmental dose-optimization  program which includes automated exposure control, adjustment of the mA and/or kV according to patient size and/or use of iterative reconstruction technique. CONTRAST:  115m OMNIPAQUE IOHEXOL 300 MG/ML  SOLN COMPARISON:  May 18, 2013 FINDINGS: Lower chest: No acute abnormality. Hepatobiliary: No focal liver abnormality is seen. Status post cholecystectomy. No biliary dilatation. Pancreas: Fatty replaced. Spleen: Normal in size without focal abnormality. Adrenals/Urinary Tract: Adrenal glands are unremarkable. Kidneys are normal, without renal calculi, focal lesion, or hydronephrosis. Bladder is unremarkable. Stomach/Bowel: Stomach is within normal limits. Appendix appears normal. No evidence of bowel wall thickening, distention, or inflammatory changes. Vascular/Lymphatic: Aortic atherosclerosis. No enlarged abdominal or pelvic lymph nodes. Reproductive: Distended heterogeneous endometrium measures 2.7 cm transversely. No adnexal masses seen. Other: No abdominal wall hernia or abnormality. No abdominopelvic ascites. Musculoskeletal: Mild posterior facet arthropathy of the lower lumbosacral spine. IMPRESSION: 1. No evidence of biliary obstruction. 2. Distended heterogeneous endometrium measures 2.7 cm in thickness. Clinical correlation is recommended. Consider tissue sampling. Aortic Atherosclerosis (ICD10-I70.0). Electronically Signed   By: DFidela SalisburyM.D.   On: 04/15/2022 17:10  ? ?MR 3D Recon At Scanner ? ?Result Date: 04/15/2022 ?CLINICAL DATA:  Biliary obstruction suspected EXAM: MRI  ABDOMEN WITHOUT AND WITH CONTRAST (INCLUDING MRCP) TECHNIQUE: Multiplanar multisequence MR imaging of the abdomen was performed both before and after the administration of intravenous contrast. Heavily T2-weighted images of the biliary and pancreatic ducts were obtained, and three-dimensional MRCP images were rendered by post processing. CONTRAST:  74m GADAVIST GADOBUTROL 1 MMOL/ML IV SOLN COMPARISON:  Same day CT abdomen  pelvis, 04/15/2022 FINDINGS: Lower chest: No acute findings.  Cardiomegaly. Hepatobiliary: Hepatomegaly, maximum coronal span 21.7 cm. No mass or other parenchymal abnormality identified. Status post cholecys

## 2022-04-19 NOTE — Progress Notes (Signed)
Spoke to this patient's family in depth yesterday about what had been done and what needs to be done.  They had multiple questions about the interventional radiology drain.  I have told them that this is something that they take care of and I do not.  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  This patient's family has been told that they need to follow-up at [facility] or [facility] for their interventional gastroenterologist for any further intervention.  Nothing further to do from a GI point of view at [facility]. ? ?I will sign off.  Please call if any further GI concerns or questions.  We would like to thank you for the opportunity to participate in the care of [name] ? ?

## 2022-04-20 NOTE — TOC CM/SW Note (Signed)
Received call from [facility] reporting patient does not have PCP that is available to sign HHC orders. RN CM LVMM for [facility] to schedule appointment for PCP.  ?

## 2022-04-21 LAB — CULTURE, BLOOD (ROUTINE X 2)
Culture: NO GROWTH
Culture: NO GROWTH
Special Requests: ADEQUATE
Special Requests: ADEQUATE

## 2022-04-23 NOTE — TOC Progression Note (Signed)
Transition of Care (TOC) - Progression Note  ? ? ?Patient Details  ?Name: [place] A Calame ?MRN: [id number] ?Date of Birth: 18-Jul-1956 ? ?Transition of Care (TOC) CM/SW Contact  ?[place] Cellar, RN ?Phone Number: ?04/23/2022, 2:33 PM ? ?Clinical Narrative:    ?Appointment scheduled with [facility] for 04/25/22 @ 11am. Patient notified as well as Denyse Amass @ [place].  ? ? ?Expected Discharge Plan: Home w Home Health Services ?Barriers to Discharge: No Barriers Identified ? ?Expected Discharge Plan and Services ?Expected Discharge Plan: Home w Home Health Services ?In-house Referral: Clinical Social Work ?  ?Post Acute Care Choice: Home Health ?Living arrangements for the past 2 months: Single Family Home ?Expected Discharge Date: 04/19/22               ?  ?  ?  ?  ?  ?HH Arranged: RN ?HH Agency: [facility] ?Date HH Agency Contacted: 04/19/22 ?Time HH Agency Contacted: [phone] ?Representative spoke with at [facility] Agency: kelsey ? ? ?Social Determinants of Health (SDOH) Interventions ?  ? ?Readmission Risk Interventions ?   ? View : No data to display.  ?  ?  ?  ? ? ?

## 2022-07-02 DIAGNOSIS — R7989 Other specified abnormal findings of blood chemistry: Secondary | ICD-10-CM | POA: Diagnosis not present

## 2023-05-15 ENCOUNTER — Other Ambulatory Visit: Payer: Self-pay

## 2023-05-15 ENCOUNTER — Emergency Department
Admission: EM | Admit: 2023-05-15 | Discharge: 2023-05-15 | Disposition: A | Discharge status: Home / Self Care | Payer: 59 | Attending: Student | Admitting: Student

## 2023-05-15 ENCOUNTER — Emergency Department: Payer: 59

## 2023-05-15 ENCOUNTER — Encounter: Payer: Self-pay | Admitting: Emergency Medicine

## 2023-05-15 DIAGNOSIS — Z5321 Procedure and treatment not carried out due to patient leaving prior to being seen by health care provider: Secondary | ICD-10-CM | POA: Insufficient documentation

## 2023-05-15 DIAGNOSIS — R079 Chest pain, unspecified: Secondary | ICD-10-CM | POA: Insufficient documentation

## 2023-05-15 LAB — COMPREHENSIVE METABOLIC PANEL
ALT: 31 U/L (ref 0–44)
AST: 34 U/L (ref 15–41)
Albumin: 3.8 g/dL (ref 3.5–5.0)
Alkaline Phosphatase: 163 U/L — ABNORMAL HIGH (ref 38–126)
Anion gap: 8 (ref 5–15)
BUN: 14 mg/dL (ref 8–23)
CO2: 25 mmol/L (ref 22–32)
Calcium: 8.8 mg/dL — ABNORMAL LOW (ref 8.9–10.3)
Chloride: 104 mmol/L (ref 98–111)
Creatinine, Ser: 0.74 mg/dL (ref 0.44–1.00)
GFR, Estimated: >60 mL/min (ref >60)
Glucose, Bld: 108 mg/dL — ABNORMAL HIGH (ref 70–99)
Potassium: 3.9 mmol/L (ref 3.5–5.1)
Sodium: 137 mmol/L (ref 135–145)
Total Bilirubin: 0.5 mg/dL (ref 0.3–1.2)
Total Protein: 7.5 g/dL (ref 6.5–8.1)

## 2023-05-15 LAB — CBC WITH DIFFERENTIAL/PLATELET
Abs Immature Granulocytes: 0.02 10*3/uL (ref 0.00–0.07)
Basophils Absolute: 0 10*3/uL (ref 0.0–0.1)
Basophils Relative: 0 %
Eosinophils Absolute: 0.1 10*3/uL (ref 0.0–0.5)
Eosinophils Relative: 1 %
HCT: 41.7 % (ref 36.0–46.0)
Hemoglobin: 13.9 g/dL (ref 12.0–15.0)
Immature Granulocytes: 0 %
Lymphocytes Relative: 19 %
Lymphs Abs: 2 10*3/uL (ref 0.7–4.0)
MCH: 31 pg (ref 26.0–34.0)
MCHC: 33.3 g/dL (ref 30.0–36.0)
MCV: 93.1 fL (ref 80.0–100.0)
Monocytes Absolute: 0.9 10*3/uL (ref 0.1–1.0)
Monocytes Relative: 9 %
Neutro Abs: 7.4 10*3/uL (ref 1.7–7.7)
Neutrophils Relative %: 71 %
Platelets: 309 10*3/uL (ref 150–400)
RBC: 4.48 MIL/uL (ref 3.87–5.11)
RDW: 12.5 % (ref 11.5–15.5)
WBC: 10.5 10*3/uL (ref 4.0–10.5)
nRBC: 0 % (ref 0.0–0.2)

## 2023-05-15 LAB — LIPASE, BLOOD: Lipase: 26 U/L (ref 11–51)

## 2023-05-15 LAB — TROPONIN I (HIGH SENSITIVITY)
Troponin I (High Sensitivity): 8 ng/L (ref <18)
Troponin I (High Sensitivity): 8 ng/L (ref <18)

## 2023-05-15 NOTE — ED Triage Notes (Signed)
Patient to ED via [facility] from home for intermittent chest pain that radiates into left arm. Pain initially started last PM. Sore when moving the arm. Has bilary tube. Denies cardiac history.

## 2023-05-15 NOTE — ED Provider Triage Note (Signed)
Emergency Medicine Provider Triage Evaluation Note  [name] , a 67 y.o. female  was evaluated in triage.  Pt complains of left arm pain, chest pain, and left shoulder pain since last night. Went away with tylenol. Awoke feeling well, went grocery shopping and then pain began again. Recent dx of choledocholithiasis with biliary tube. Daughter thinks she has a pulled muscle. Patient reports that it hurts when she moves her arm.  Review of Systems  Positive: Cp, sob, arm pain Negative: fever  Physical Exam  There were no vitals taken for this visit. Gen:   Awake, no distress   Resp:  Normal effort  MSK:   Moves extremities without difficulty  Other:  Biliary tube in place  Medical Decision Making  Medically screening exam initiated at 4:40 PM.  Appropriate orders placed.  [name] A Henthorn was informed that the remainder of the evaluation will be completed by another provider, this initial triage assessment does not replace that evaluation, and the importance of remaining in the ED until their evaluation is complete.     Jackelyn Hoehn, PA-C 05/15/23 1643

## 2023-05-15 NOTE — ED Notes (Signed)
18GA SL removed from left a/c at pt request--cannula intact and dressing applied

## 2023-05-15 NOTE — ED Triage Notes (Signed)
First Nurse Note : Pt via EMS from home. Pt c/o CP, pt has a hx of anxiety. 12 lead EKG unremarkable. EMS gave 324 ASA, 18 G L AC. ST on the montior. Pt is A&Ox4 and NAD, pt is [facility]

## 2023-06-12 ENCOUNTER — Other Ambulatory Visit: Payer: Self-pay | Admitting: Internal Medicine

## 2023-06-12 DIAGNOSIS — Z1231 Encounter for screening mammogram for malignant neoplasm of breast: Secondary | ICD-10-CM

## 2023-09-24 DIAGNOSIS — K8050 Calculus of bile duct without cholangitis or cholecystitis without obstruction: Secondary | ICD-10-CM | POA: Diagnosis not present

## 2023-09-24 DIAGNOSIS — R9389 Abnormal findings on diagnostic imaging of other specified body structures: Secondary | ICD-10-CM | POA: Diagnosis not present

## 2023-10-09 DIAGNOSIS — H2513 Age-related nuclear cataract, bilateral: Secondary | ICD-10-CM | POA: Diagnosis not present

## 2023-10-09 DIAGNOSIS — H40013 Open angle with borderline findings, low risk, bilateral: Secondary | ICD-10-CM | POA: Diagnosis not present

## 2023-10-09 DIAGNOSIS — H5203 Hypermetropia, bilateral: Secondary | ICD-10-CM | POA: Diagnosis not present

## 2023-10-09 DIAGNOSIS — Z0100 Encounter for examination of eyes and vision without abnormal findings: Secondary | ICD-10-CM | POA: Diagnosis not present

## 2023-10-09 DIAGNOSIS — H11043 Peripheral pterygium, stationary, bilateral: Secondary | ICD-10-CM | POA: Diagnosis not present

## 2023-11-26 DIAGNOSIS — Z4682 Encounter for fitting and adjustment of non-vascular catheter: Secondary | ICD-10-CM | POA: Diagnosis not present

## 2023-11-26 DIAGNOSIS — R232 Flushing: Secondary | ICD-10-CM | POA: Diagnosis not present

## 2023-11-26 DIAGNOSIS — K831 Obstruction of bile duct: Secondary | ICD-10-CM | POA: Diagnosis not present

## 2024-03-24 ENCOUNTER — Ambulatory Visit

## 2024-03-24 ENCOUNTER — Other Ambulatory Visit: Payer: Self-pay | Admitting: Physician Assistant

## 2024-03-24 DIAGNOSIS — L02211 Cutaneous abscess of abdominal wall: Secondary | ICD-10-CM

## 2024-03-25 ENCOUNTER — Ambulatory Visit
Admission: RE | Admit: 2024-03-25 | Discharge: 2024-03-25 | Disposition: A | Discharge status: Home / Self Care | Source: Ambulatory Visit | Attending: Physician Assistant | Admitting: Physician Assistant

## 2024-03-25 DIAGNOSIS — L02211 Cutaneous abscess of abdominal wall: Secondary | ICD-10-CM | POA: Insufficient documentation

## 2024-03-26 ENCOUNTER — Ambulatory Visit

## 2024-03-26 ENCOUNTER — Other Ambulatory Visit: Payer: Self-pay | Admitting: Physician Assistant

## 2024-03-26 DIAGNOSIS — L02211 Cutaneous abscess of abdominal wall: Secondary | ICD-10-CM

## 2024-03-27 ENCOUNTER — Encounter: Payer: Self-pay | Admitting: Physician Assistant

## 2024-03-27 ENCOUNTER — Ambulatory Visit
Admission: RE | Admit: 2024-03-27 | Discharge: 2024-03-27 | Disposition: A | Discharge status: Home / Self Care | Source: Ambulatory Visit | Attending: Physician Assistant | Admitting: Physician Assistant

## 2024-03-27 DIAGNOSIS — L02211 Cutaneous abscess of abdominal wall: Secondary | ICD-10-CM

## 2024-03-27 MED ORDER — IOHEXOL 300 MG/ML  SOLN
100.0000 mL | Freq: Once | INTRAMUSCULAR | Status: AC | PRN
  Administered 2024-03-27: 100 mL via INTRAVENOUS

## 2024-11-10 NOTE — H&P (Signed)
 [name] is a 68 y.o. female here for D+C , H/S resection of endometrial polyps with myosure    Embx :    : Part A-Endometrial Biopsy: BENIGN ENDOMETRIAL POLYP. SMALL ENDOMETRIAL FRAGMENTS WITH EXTENSIVE BREAKDOWN CHANGES IN A BACKGROUND OF BLOOD. NO HYPERPLASIA OR CARCINOMA IDENTIFIED IN THIS SIGNIFICANTLY FRAGMENTED SPECIMEN    Past Medical History:  has a past medical history of Venous ulcer of leg (CMS/HHS-HCC).  Past Surgical History:  has a past surgical history that includes Cholecystectomy (2005); robot assited laparoscopic diagnostic; abdomen, peritoneum, and omentum (N/A, 01/06/2024); and anastamosis extrahepatic biliary ducts to gi tract (N/A, 01/06/2024). Family History: family history includes Asthma in her sister; Coronary Artery Disease (Blocked arteries around heart) in her father and mother; Diabetes type II in her father and mother; Lumbar disc disease in her sister; Rheum arthritis in her sister. Social History:  reports that she quit smoking about 29 years ago. Her smoking use included cigarettes. She has never used smokeless tobacco. She reports that she does not drink alcohol and does not use drugs. OB/GYN History:  OB History       Gravida  4   Para  4   Term  4   Preterm      AB      Living  4        SAB      IAB      Ectopic      Molar      Multiple      Live Births  4             Allergies: is allergic to codeine, sulfamethoxazole -trimethoprim , tramadol , and sulfa  (sulfonamide antibiotics). Medications:  Current Medications    Current Outpatient Medications:    acetaminophen  (TYLENOL ) 500 MG tablet, Take 1,500 mg by mouth 3 (three) times daily, Disp: , Rfl:    cholecalciferol (VITAMIN D3) 2,000 unit tablet, Take 1 tablet (2,000 Units total) by mouth once daily, Disp: 30 tablet, Rfl: 11   diclofenac (VOLTAREN) 1 % topical gel, Apply 2 g topically 4 (four) times daily, Disp: 100 g, Rfl: 3   medroxyPROGESTERone (PROVERA) 10 MG  tablet, Take 1 tablet (10 mg total) by mouth once daily, Disp: 30 tablet, Rfl: 11   MULTIVITAMIN WITH FOLIC ACID ORAL, Take 1 tablet by mouth once daily, Disp: , Rfl:    tranexamic acid (LYSTEDA) 650 mg tablet, Take 2 tablets (1,300 mg total) by mouth 3 (three) times daily Take for a maximum of 5 days during monthly menstruation., Disp: 30 tablet, Rfl: 3   citalopram (CELEXA) 10 MG tablet, Take 1 tablet (10 mg total) by mouth once daily (Patient not taking: Reported on 10/21/2024), Disp: 30 tablet, Rfl: 11     Review of Systems: General:                      No fatigue or weight loss Eyes:                           No vision changes Ears:                            No hearing difficulty Respiratory:                No cough or shortness of breath Pulmonary:  No asthma or shortness of breath Cardiovascular:           No chest pain, palpitations, dyspnea on exertion Gastrointestinal:          No abdominal bloating, chronic diarrhea, constipations, masses, pain or hematochezia Genitourinary:             No hematuria, dysuria, abnormal vaginal discharge, pelvic pain, Menometrorrhagia Lymphatic:                   No swollen lymph nodes Musculoskeletal:No muscle weakness Neurologic:                  No extremity weakness, syncope, seizure disorder Psychiatric:                  No history of depression, delusions or suicidal/homicidal ideation      Exam:       Vitals:    10/21/24 1600  BP: (!) 155/90  Pulse: 83      Body mass index is 45.04 kg/m.   WDWN white/  female in NAD   Lungs: CTA  CV : RRR without murmur   Neck:  no thyromegaly Abdomen: soft , no mass, normal active bowel sounds,  non-tender, no rebound tenderness Pelvic: tanner stage 5 ,  External genitalia: vulva /labia no lesions Urethra: no prolapse Vagina: normal physiologic d/c, adequate room for TVH if need be  Cervix: no lesions, no cervical motion tenderness   Uterus: normal size shape and contour,  non-tender Adnexa: no mass,  non-tender   Rectovaginal:  Pelvic exam done  Chaperone present     Saline infusion sonohysterography: betadine prep to the cervix followed by placement of the HSG catheter into the endometrial canal . Sterile H2O is injected while performing a transvaginal u/s . Findings:    Uterus Visualized. Size 46 mm x 51 mm x 37 mm Normal Posi??on: anteverted Malforma??ons: none Myometrium: appears normal Endometrium: uniform echogenicity: hyperechogenic, endometrial-myometrial junc??on: regular, intracavitary fluid: anechogenic. Endometrial thickness, total 14.1 mm Cervix details: The cervical length is 2.6 cm. Fibroid(s) Intramural. Anterior. Homogeneous structure. Size 9.00 mm x 12.00 mm x 13 mm. Mean 11.3 mm. Vol 0.7 cm. Doppler: color score 1 (no color) Right Ovary Visualized, Normal. Outline: Normal. Morphology: Appropriate. Size 20 mm x 16 mm x 10 mm No cysts iden??fied Follicles iden??fied Right Tube Not visualized Le?? Ovary Visualized, Normal. Outline: Normal. Morphology: Appropriate. Size 21 mm x 15 mm x 13 mm No cysts iden??fied Follicles iden??ala Daniels?? Tube Not visualized [place] Normal. No free fluid visualized Impression Endovaginal ultrasound performed today due to the indica??ons outlined above. The sonogram reveals a normal appearing uterus. Page 2 of 2 for report of pa??ent [name]  [name], DOB 08-Apr-1956 The endometrium appears thickened and echogenic. Anechoic fluid collec??on seen within endometrial cavity at the fundal aspect measuring 18 x 6 x 12 mm. The ovaries were visualized bilaterally and appear normal. There are no unusual adnexal findings. There is no free fluid.  Saline ultrasound With intrauterine saline contrast in place, the endometrium contains three echogenic polyps measuring 16 x 10 x 16 mm, 4 x 4 x 3 mm, and 11 x 5 x 8 mm.   Impression:    The primary encounter diagnosis was PMB (postmenopausal bleeding). A  diagnosis of Endometrial polyp was also pertinent to this visit.       Plan:  Spoke to the patient about D+C  and hysteroscopic resection of endometrial polyps .  Pt agrees  to the procedure    Benefits and risks to surgery: The proposed benefit of the surgery has been discussed with the patient. The possible risks include, but are not limited to: organ injury to the bowel , bladder, ureters, and major blood vessels and nerves. There is a possibility of additional surgeries resulting from these injuries. There is also the risk of blood transfusion and the need to receive blood products during or after the procedure which may rarely lead to HIV or Hepatitis C infection. There is a risk of developing a deep venous thrombosis or a pulmonary embolism . There is the possibility of wound infection and also anesthetic complications, even the rare possibility of death. The patient understands these risks and wishes to proceed. All questions have been answered and the consent has been signed.

## 2024-11-11 ENCOUNTER — Encounter
Admission: RE | Admit: 2024-11-11 | Discharge: 2024-11-11 | Disposition: A | Discharge status: Home / Self Care | Source: Ambulatory Visit | Attending: Obstetrics and Gynecology | Admitting: Obstetrics and Gynecology

## 2024-11-11 ENCOUNTER — Other Ambulatory Visit: Payer: Self-pay

## 2024-11-11 DIAGNOSIS — Z01810 Encounter for preprocedural cardiovascular examination: Secondary | ICD-10-CM

## 2024-11-11 HISTORY — DX: Anxiety disorder, unspecified: F41.9

## 2024-11-11 HISTORY — DX: Unspecified osteoarthritis, unspecified site: M19.90

## 2024-11-11 HISTORY — DX: Postmenopausal bleeding: N95.0

## 2024-11-11 NOTE — Patient Instructions (Addendum)
 Your procedure is scheduled on: 11/17/24 - Tuesday Report to the Registration Desk on the 1st floor of the [facility]. To find out your arrival time, please call [phone] between 1PM - 3PM on: 11/16/24 - Monday  If your arrival time is 6:00 am, do not arrive before that time as the Medical Mall entrance doors do not open until 6:00 am.  REMEMBER: Instructions that are not followed completely may result in serious medical risk, up to and including death; or upon the discretion of your surgeon and anesthesiologist your surgery may need to be rescheduled.  Do not eat food after midnight the night before surgery.  No gum chewing or hard candies.  You may however, drink CLEAR liquids up to 2 hours before you are scheduled to arrive for your surgery. Do not drink anything within 2 hours of your scheduled arrival time.  Clear liquids include: - water  - apple juice without pulp - gatorade (not RED colors) - black coffee or tea (Do NOT add milk or creamers to the coffee or tea) Do NOT drink anything that is not on this list.  One week prior to surgery: Stop Anti-inflammatories (NSAIDS) such as Advil , Aleve , Ibuprofen , Motrin , Naproxen , Naprosyn  and Aspirin based products such as Excedrin, Goody's Powder, BC Powder. You may take Tylenol  if needed for pain up until the day of surgery.  Stop ANY OVER THE COUNTER supplements until after surgery.  ON THE DAY OF SURGERY ONLY TAKE THESE MEDICATIONS WITH SIPS OF WATER:  none   No Alcohol for 24 hours before or after surgery.  No Smoking including e-cigarettes for 24 hours before surgery.  No chewable tobacco products for at least 6 hours before surgery.  No nicotine patches on the day of surgery.  Do not use any recreational drugs for at least a week (preferably 2 weeks) before your surgery.  Please be advised that the combination of cocaine and anesthesia may have negative outcomes, up to and including death. If you test positive  for cocaine, your surgery will be cancelled.  On the morning of surgery brush your teeth with toothpaste and water, you may rinse your mouth with mouthwash if you wish. Do not swallow any toothpaste or mouthwash.  Do not wear jewelry, make-up, hairpins, clips or nail polish.  For welded (permanent) jewelry: bracelets, anklets, waist bands, etc.  Please have this removed prior to surgery.  If it is not removed, there is a chance that hospital personnel will need to cut it off on the day of surgery.  Do not wear lotions, powders, or perfumes.   Do not shave body hair from the neck down 48 hours before surgery.  Contact lenses, hearing aids and dentures may not be worn into surgery.  Do not bring valuables to the hospital. [facility] is not responsible for any missing/lost belongings or valuables.   Notify your doctor if there is any change in your medical condition (cold, fever, infection).  Wear comfortable clothing (specific to your surgery type) to the hospital.  After surgery, you can help prevent lung complications by doing breathing exercises.  Take deep breaths and cough every 1-2 hours. Your doctor may order a device called an Incentive Spirometer to help you take deep breaths.  If you are being admitted to the hospital overnight, leave your suitcase in the car. After surgery it may be brought to your room.  In case of increased patient census, it may be necessary for you, the patient, to continue  your postoperative care in the Same Day Surgery department.  If you are being discharged the day of surgery, you will not be allowed to drive home. You will need a responsible individual to drive you home and stay with you for 24 hours after surgery.   If you are taking public transportation, you will need to have a responsible individual with you.  Please call the Pre-admissions Testing Dept. at [phone] if you have any questions about these instructions.  Surgery Visitation  Policy:  Patients having surgery or a procedure may have two visitors.  Children under the age of 37 must have an adult with them who is not the patient.  Inpatient Visitation:    Visiting hours are 7 a.m. to 8 p.m. Up to four visitors are allowed at one time in a patient room. The visitors may rotate out with other people during the day.  One visitor age 46 or older may stay with the patient overnight and must be in the room by 8 p.m.   Merchandiser, Retail to address health-related social needs:  [email or web address]

## 2024-11-12 ENCOUNTER — Encounter
Admission: RE | Admit: 2024-11-12 | Discharge: 2024-11-12 | Disposition: A | Discharge status: Home / Self Care | Source: Ambulatory Visit | Attending: Obstetrics and Gynecology | Admitting: Obstetrics and Gynecology

## 2024-11-12 DIAGNOSIS — Z01818 Encounter for other preprocedural examination: Secondary | ICD-10-CM | POA: Insufficient documentation

## 2024-11-12 DIAGNOSIS — Z01810 Encounter for preprocedural cardiovascular examination: Secondary | ICD-10-CM

## 2024-11-12 LAB — BASIC METABOLIC PANEL WITH GFR
Anion gap: 12 (ref 5–15)
BUN: 22 mg/dL (ref 8–23)
CO2: 27 mmol/L (ref 22–32)
Calcium: 9.9 mg/dL (ref 8.9–10.3)
Chloride: 100 mmol/L (ref 98–111)
Creatinine, Ser: 0.74 mg/dL (ref 0.44–1.00)
GFR, Estimated: >60 mL/min (ref >60)
Glucose, Bld: 91 mg/dL (ref 70–99)
Potassium: 4.5 mmol/L (ref 3.5–5.1)
Sodium: 138 mmol/L (ref 135–145)

## 2024-11-12 LAB — TYPE AND SCREEN
ABO/RH(D): A POS
Antibody Screen: NEGATIVE

## 2024-11-12 LAB — CBC
HCT: 43 % (ref 36.0–46.0)
Hemoglobin: 14.3 g/dL (ref 12.0–15.0)
MCH: 32.1 pg (ref 26.0–34.0)
MCHC: 33.3 g/dL (ref 30.0–36.0)
MCV: 96.6 fL (ref 80.0–100.0)
Platelets: 292 K/uL (ref 150–400)
RBC: 4.45 MIL/uL (ref 3.87–5.11)
RDW: 11.9 % (ref 11.5–15.5)
WBC: 6.4 K/uL (ref 4.0–10.5)
nRBC: 0 % (ref 0.0–0.2)

## 2024-11-17 ENCOUNTER — Ambulatory Visit
Admission: RE | Admit: 2024-11-17 | Discharge: 2024-11-17 | Disposition: A | Discharge status: Home / Self Care | Attending: Obstetrics and Gynecology | Admitting: Obstetrics and Gynecology

## 2024-11-17 ENCOUNTER — Other Ambulatory Visit: Payer: Self-pay

## 2024-11-17 ENCOUNTER — Ambulatory Visit: Admitting: General Practice

## 2024-11-17 ENCOUNTER — Encounter: Payer: Self-pay | Admitting: Obstetrics and Gynecology

## 2024-11-17 ENCOUNTER — Encounter
Admission: RE | Disposition: A | Discharge status: Home / Self Care | Payer: Self-pay | Source: Home / Self Care | Attending: Obstetrics and Gynecology

## 2024-11-17 DIAGNOSIS — Z01818 Encounter for other preprocedural examination: Secondary | ICD-10-CM

## 2024-11-17 DIAGNOSIS — F419 Anxiety disorder, unspecified: Secondary | ICD-10-CM | POA: Diagnosis not present

## 2024-11-17 DIAGNOSIS — N879 Dysplasia of cervix uteri, unspecified: Secondary | ICD-10-CM | POA: Diagnosis not present

## 2024-11-17 DIAGNOSIS — N950 Postmenopausal bleeding: Secondary | ICD-10-CM | POA: Insufficient documentation

## 2024-11-17 DIAGNOSIS — Z6841 Body Mass Index (BMI) 40.0 and over, adult: Secondary | ICD-10-CM | POA: Diagnosis not present

## 2024-11-17 DIAGNOSIS — N840 Polyp of corpus uteri: Secondary | ICD-10-CM | POA: Insufficient documentation

## 2024-11-17 DIAGNOSIS — E66813 Obesity, class 3: Secondary | ICD-10-CM | POA: Diagnosis not present

## 2024-11-17 HISTORY — PX: HYSTEROSCOPY WITH D & C: SHX1775

## 2024-11-17 LAB — ABO/RH: ABO/RH(D): A POS

## 2024-11-17 SURGERY — DILATATION AND CURETTAGE /HYSTEROSCOPY
Anesthesia: General | Site: Cervix

## 2024-11-17 MED ORDER — ONDANSETRON HCL 4 MG/2ML IJ SOLN
INTRAMUSCULAR | Status: AC
Start: 2024-11-17 — End: 2024-11-17
  Filled 2024-11-17: qty 2

## 2024-11-17 MED ORDER — SEVOFLURANE IN SOLN
RESPIRATORY_TRACT | Status: AC
  Filled 2024-11-17: qty 250

## 2024-11-17 MED ORDER — ACETAMINOPHEN 500 MG PO TABS
ORAL_TABLET | ORAL | Status: AC
Start: 2024-11-17 — End: 2024-11-17
  Filled 2024-11-17: qty 2

## 2024-11-17 MED ORDER — KETOROLAC TROMETHAMINE 30 MG/ML IJ SOLN
INTRAMUSCULAR | Status: DC | PRN
  Administered 2024-11-17: 15 mg via INTRAVENOUS

## 2024-11-17 MED ORDER — SUCCINYLCHOLINE CHLORIDE 200 MG/10ML IV SOSY
PREFILLED_SYRINGE | INTRAVENOUS | Status: AC
Start: 2024-11-17 — End: 2024-11-17
  Filled 2024-11-17: qty 10

## 2024-11-17 MED ORDER — EPHEDRINE 5 MG/ML INJ
INTRAVENOUS | Status: AC
  Filled 2024-11-17: qty 5

## 2024-11-17 MED ORDER — ORAL CARE MOUTH RINSE: 15.0000 mL | Freq: Once | OROMUCOSAL | Status: AC

## 2024-11-17 MED ORDER — CHLORHEXIDINE GLUCONATE 0.12 % MT SOLN
OROMUCOSAL | Status: AC
Start: 2024-11-17 — End: 2024-11-17
  Filled 2024-11-17: qty 15

## 2024-11-17 MED ORDER — LACTATED RINGERS IV SOLN: INTRAVENOUS | Status: DC

## 2024-11-17 MED ORDER — SILVER NITRATE-POT NITRATE 75-25 % EX MISC
CUTANEOUS | Status: AC
  Filled 2024-11-17: qty 10

## 2024-11-17 MED ORDER — DEXAMETHASONE SOD PHOSPHATE PF 10 MG/ML IJ SOLN
INTRAMUSCULAR | Status: DC | PRN
  Administered 2024-11-17: 10 mg via INTRAVENOUS

## 2024-11-17 MED ORDER — CEFAZOLIN SODIUM-DEXTROSE 2-4 GM/100ML-% IV SOLN
2.0000 g | Freq: Once | INTRAVENOUS | Status: AC
  Administered 2024-11-17: 2 g via INTRAVENOUS

## 2024-11-17 MED ORDER — SODIUM CHLORIDE 0.9 % IR SOLN
Status: DC | PRN
  Administered 2024-11-17: 3000 mL

## 2024-11-17 MED ORDER — ROCURONIUM BROMIDE 10 MG/ML (PF) SYRINGE
PREFILLED_SYRINGE | INTRAVENOUS | Status: AC
  Filled 2024-11-17: qty 10

## 2024-11-17 MED ORDER — LIDOCAINE HCL (CARDIAC) PF 100 MG/5ML IV SOSY
PREFILLED_SYRINGE | INTRAVENOUS | Status: DC | PRN
  Administered 2024-11-17: 100 mg via INTRAVENOUS

## 2024-11-17 MED ORDER — PROPOFOL 10 MG/ML IV BOLUS
INTRAVENOUS | Status: AC
  Filled 2024-11-17: qty 20

## 2024-11-17 MED ORDER — MIDAZOLAM HCL (PF) 2 MG/2ML IJ SOLN
INTRAMUSCULAR | Status: DC | PRN
  Administered 2024-11-17: 2 mg via INTRAVENOUS

## 2024-11-17 MED ORDER — KETOROLAC TROMETHAMINE 30 MG/ML IJ SOLN
INTRAMUSCULAR | Status: AC
  Filled 2024-11-17: qty 1

## 2024-11-17 MED ORDER — FENTANYL CITRATE (PF) 100 MCG/2ML IJ SOLN
INTRAMUSCULAR | Status: DC | PRN
  Administered 2024-11-17: 25 ug via INTRAVENOUS
  Administered 2024-11-17: 50 ug via INTRAVENOUS
  Administered 2024-11-17: 25 ug via INTRAVENOUS

## 2024-11-17 MED ORDER — MIDAZOLAM HCL 2 MG/2ML IJ SOLN
INTRAMUSCULAR | Status: AC
  Filled 2024-11-17: qty 2

## 2024-11-17 MED ORDER — OXYCODONE HCL 5 MG/5ML PO SOLN: 5.0000 mg | Freq: Once | ORAL | Status: AC | PRN

## 2024-11-17 MED ORDER — OXYCODONE HCL 5 MG PO TABS
ORAL_TABLET | ORAL | Status: AC
  Filled 2024-11-17: qty 1

## 2024-11-17 MED ORDER — FENTANYL CITRATE (PF) 100 MCG/2ML IJ SOLN
INTRAMUSCULAR | Status: AC
  Filled 2024-11-17: qty 2

## 2024-11-17 MED ORDER — PROPOFOL 10 MG/ML IV BOLUS
INTRAVENOUS | Status: DC | PRN
  Administered 2024-11-17: 20 mg via INTRAVENOUS
  Administered 2024-11-17: 180 mg via INTRAVENOUS

## 2024-11-17 MED ORDER — ONDANSETRON HCL 4 MG/2ML IJ SOLN
INTRAMUSCULAR | Status: DC | PRN
  Administered 2024-11-17: 4 mg via INTRAVENOUS

## 2024-11-17 MED ORDER — OXYCODONE HCL 5 MG PO TABS
5.0000 mg | ORAL_TABLET | Freq: Once | ORAL | Status: AC | PRN
  Administered 2024-11-17: 5 mg via ORAL

## 2024-11-17 MED ORDER — ACETAMINOPHEN 500 MG PO TABS
1000.0000 mg | ORAL_TABLET | ORAL | Status: AC
  Administered 2024-11-17: 1000 mg via ORAL

## 2024-11-17 MED ORDER — STERILE WATER FOR IRRIGATION IR SOLN
Status: DC | PRN
  Administered 2024-11-17: 500 mL

## 2024-11-17 MED ORDER — LIDOCAINE HCL (PF) 2 % IJ SOLN
INTRAMUSCULAR | Status: AC
Start: 2024-11-17 — End: 2024-11-17
  Filled 2024-11-17: qty 5

## 2024-11-17 MED ORDER — EPHEDRINE SULFATE-NACL 50-0.9 MG/10ML-% IV SOSY
PREFILLED_SYRINGE | INTRAVENOUS | Status: DC | PRN
  Administered 2024-11-17: 5 mg via INTRAVENOUS

## 2024-11-17 MED ORDER — POVIDONE-IODINE 10 % EX SWAB
2.0000 | Freq: Once | CUTANEOUS | Status: AC
  Administered 2024-11-17: 2 via TOPICAL

## 2024-11-17 MED ORDER — FENTANYL CITRATE (PF) 100 MCG/2ML IJ SOLN: 25.0000 ug | INTRAMUSCULAR | Status: DC | PRN

## 2024-11-17 MED ORDER — CHLORHEXIDINE GLUCONATE 0.12 % MT SOLN
15.0000 mL | Freq: Once | OROMUCOSAL | Status: AC
Start: 2024-11-17 — End: 2024-11-17
  Administered 2024-11-17: 15 mL via OROMUCOSAL

## 2024-11-17 MED ORDER — CEFAZOLIN SODIUM-DEXTROSE 2-4 GM/100ML-% IV SOLN
INTRAVENOUS | Status: AC
  Filled 2024-11-17: qty 100

## 2024-11-17 SURGICAL SUPPLY — 21 items
BAG PRESSURE INF REUSE 1000 (BAG) ×1 IMPLANT
BASIN KIT SINGLE STR (MISCELLANEOUS) IMPLANT
DEVICE MYOSURE LITE (MISCELLANEOUS) IMPLANT
DEVICE MYOSURE REACH (MISCELLANEOUS) IMPLANT
DRSG TELFA 3X8 NADH STRL (GAUZE/BANDAGES/DRESSINGS) ×1 IMPLANT
GLOVE SURG SYN 8.0 PF PI (GLOVE) ×1 IMPLANT
GOWN STRL REUS W/ TWL LRG LVL3 (GOWN DISPOSABLE) ×1 IMPLANT
GOWN STRL REUS W/ TWL XL LVL3 (GOWN DISPOSABLE) ×1 IMPLANT
IV 0.9% NACL 1000 ML (IV SOLUTION) ×1 IMPLANT
KIT PROCED FLUENT PRO FLT212S (KITS) IMPLANT
KIT TURNOVER CYSTO (KITS) ×1 IMPLANT
MANIFOLD NEPTUNE II (INSTRUMENTS) ×1 IMPLANT
PACK DNC HYST (MISCELLANEOUS) IMPLANT
SEAL ROD LENS SCOPE MYOSURE (ABLATOR) ×1 IMPLANT
SET CYSTO IRRIGATION (SET/KITS/TRAYS/PACK) IMPLANT
SOL .9 NS 3000ML IRR UROMATIC (IV SOLUTION) ×1 IMPLANT
SOLN STERILE WATER 500 ML (IV SOLUTION) ×1 IMPLANT
SOLUTION PREP PVP 2OZ (MISCELLANEOUS) ×1 IMPLANT
TOWEL OR 17X26 4PK STRL BLUE (TOWEL DISPOSABLE) ×1 IMPLANT
TRAP FLUID SMOKE EVACUATOR (MISCELLANEOUS) ×1 IMPLANT
TUBING CONNECTING 10 (TUBING) IMPLANT

## 2024-11-17 NOTE — Anesthesia Procedure Notes (Signed)
 Procedure Name: LMA Insertion Date/Time: 11/17/2024 2:30 PM  Performed by: Duwayne Craven, CRNAPre-anesthesia Checklist: Patient identified, Patient being monitored, Timeout performed, Emergency Drugs available and Suction available Patient Re-evaluated:Patient Re-evaluated prior to induction Oxygen Delivery Method: Circle system utilized Preoxygenation: Pre-oxygenation with 100% oxygen Induction Type: IV induction Ventilation: Mask ventilation without difficulty LMA: LMA inserted LMA Size: 4.0 Tube type: Oral Number of attempts: 1 Placement Confirmation: positive ETCO2 and breath sounds checked- equal and bilateral Tube secured with: Tape Dental Injury: Teeth and Oropharynx as per pre-operative assessment

## 2024-11-17 NOTE — Anesthesia Preprocedure Evaluation (Signed)
 Anesthesia Evaluation  Patient identified by MRN, date of birth, ID band Patient awake    Reviewed: Allergy & Precautions, NPO status , Patient's Chart, lab work & pertinent test results  History of Anesthesia Complications Negative for: history of anesthetic complications  Airway Mallampati: III  TM Distance: >3 FB Neck ROM: Full    Dental  (+) Partial Lower, Partial Upper, Chipped   Pulmonary neg pulmonary ROS   Pulmonary exam normal breath sounds clear to auscultation       Cardiovascular Exercise Tolerance: Good negative cardio ROS Normal cardiovascular exam Rhythm:Regular Rate:Normal     Neuro/Psych  PSYCHIATRIC DISORDERS Anxiety     negative neurological ROS     GI/Hepatic negative GI ROS, Neg liver ROS,,,Choledocholithiasis    Endo/Other  negative endocrine ROS  Class 3 obesityClass 3 obesity  Renal/GU      Musculoskeletal   Abdominal   Peds  Hematology negative hematology ROS (+)   Anesthesia Other Findings Past Medical History: No date: Anxiety No date: Arthritis No date: Injury, blood vessel No date: PMB (postmenopausal bleeding)  Past Surgical History: No date: CHOLECYSTECTOMY 11/12/2017: ENDOVENOUS ABLATION SAPHENOUS VEIN W/ LASER; Left     Comment:  endovenous laser ablation left greater saphenous vein by              Lynwood Collum MD  04/16/2022: ERCP; N/A     Comment:  Procedure: ENDOSCOPIC RETROGRADE               CHOLANGIOPANCREATOGRAPHY (ERCP);  Surgeon: Jinny Carmine,               MD;  Location: [facility] END[facility]COPY;  Service: Endoscopy;                Laterality: N/A; 04/17/2022: IR INT EXT BILIARY DRAIN WITH CHOLANGIOGRAM No date: VASCULAR SURGERY  BMI    Body Mass Index: 43.36 kg/m      Reproductive/Obstetrics negative OB ROS                              Anesthesia Physical Anesthesia Plan  ASA: 3  Anesthesia Plan: General   Post-op Pain Management:     Induction: Intravenous  PONV Risk Score and Plan: 3 and Ondansetron , Dexamethasone  and Midazolam   Airway Management Planned: LMA  Additional Equipment:   Intra-op Plan:   Post-operative Plan: Extubation in OR  Informed Consent: I have reviewed the patients History and Physical, chart, labs and discussed the procedure including the risks, benefits and alternatives for the proposed anesthesia with the patient or authorized representative who has indicated his/her understanding and acceptance.     Dental Advisory Given  Plan Discussed with: Anesthesiologist, CRNA and Surgeon  Anesthesia Plan Comments: (Patient consented for risks of anesthesia including but not limited to:  - adverse reactions to medications - damage to eyes, teeth, lips or other oral mucosa - nerve damage due to positioning  - sore throat or hoarseness - Damage to heart, brain, nerves, lungs, other parts of body or loss of life  Patient voiced understanding and assent.)        Anesthesia Quick Evaluation

## 2024-11-17 NOTE — Op Note (Signed)
 NAME: [name], [name]  A. MEDICAL RECORD NO: [id number] ACCOUNT NO: [id number] DATE OF BIRTH: November 01, 1956 FACILITY: [facility] LOCATION: [facility] PHYSICIAN: Debby DOROTHA Dinsmore, MD  Operative Report   DATE OF PROCEDURE: 11/17/2024  PREOPERATIVE DIAGNOSES: 1. Postmenopausal bleeding. 2. Endometrial polyps.  POSTOPERATIVE DIAGNOSES: 1. Postmenopausal bleeding. 2. Endometrial polyps.  PROCEDURE: 1.  Dilation and curettage. 2.  Hysteroscopic resection of endometrial polyps with MyoSure.  SURGEON:  Debby DOROTHA Dinsmore, MD  ANESTHESIA:  General endotracheal anesthesia.  INDICATIONS:  A 68 year old female with postmenopausal bleeding and endometrial biopsy in the office revealed benign pathology and documented endometrial polyp fragments.  Saline infusion sonohysterography in the office revealed at least 3 small  endometrial polyps.  DESCRIPTION OF PROCEDURE:  After adequate general endotracheal anesthesia, the patient was placed in the dorsal supine position with the legs in the candy cane stirrups.  The patient's lower abdomen, perineum, and vagina were prepped and draped in the  normal sterile fashion.  A timeout was performed.  The patient did receive 2 grams IV Ancef  prior to commencement of the case.  Straight catheterization of the bladder yielded 250 mL of clear urine.  A weighted speculum was placed in the posterior  vaginal vault and the anterior cervix was grasped with a single-tooth tenaculum.  An endocervical curettage was performed.  The uterus sounded to 9 cm.  The cervix was dilated to #14 Hanks dilator and then the 5-mm hysteroscope was advanced into the  endometrial cavity with normal saline as the distending medium.  Several small polyps total in 5 were noted.  The MyoSure light was then brought up to the operative field and resection of all polyps were performed.  Pictures were taken.  The hysteroscope  was removed.  The cervix was then dilated to #20 Hanks dilator  and a thorough endometrial curettage was then performed with scant additional tissue.  Good hemostasis was noted.  There were no complications.  The patient did receive 15 mg Toradol  at the  end of the procedure and was taken to the recovery room in good condition.  INTRAOPERATIVE FLUIDS:  600 mL.  ESTIMATED BLOOD LOSS:  5 mL.  URINE OUTPUT:  250 mL.  NET DEFICIT:  50 mL.  DISPOSITION:  The patient was taken to the recovery room in good condition.   PUS D: 11/17/2024 3:39:34 pm T: 11/17/2024 4:33:00 pm  JOB: [id number]/ [id number]

## 2024-11-17 NOTE — Brief Op Note (Signed)
 11/17/2024  3:12 PM  PATIENT:  [name]  68 y.o. female  PRE-OPERATIVE DIAGNOSIS:  postmenopausal bleeding endometrial polyps  POST-OPERATIVE DIAGNOSIS:  postmenopausal bleeding endometrial polyps  PROCEDURE:  Procedure(s) with comments: DILATATION AND CURETTAGE /HYSTEROSCOPY (N/A) - Hysteroscopic removal of endometrial polyps, D&C  SURGEON:  Surgeons and Role:    * Abdo Denault, Debby PARAS, MD - Primary  PHYSICIAN ASSISTANT: CST  ASSISTANTS: none   ANESTHESIA:   general  EBL:  ebl 5 cc iof 600 cc , ou 250 cc   BLOOD ADMINISTERED:none  DRAINS: none   LOCAL MEDICATIONS USED:  NONE  SPECIMEN:  Source of Specimen:  ecc, endometrial curettings and endometrial polyps   DISPOSITION OF SPECIMEN:  PATHOLOGY  COUNTS:  YES  TOURNIQUET:  * No tourniquets in log *  DICTATION: .Other Dictation: Dictation Number verbal  PLAN OF CARE: Discharge to home after PACU  PATIENT DISPOSITION:  PACU - hemodynamically stable.   Delay start of Pharmacological VTE agent (>24hrs) due to surgical blood loss or risk of bleeding: not applicable

## 2024-11-17 NOTE — Transfer of Care (Signed)
 Immediate Anesthesia Transfer of Care Note  Patient: [name]  A [name]  Procedure(s) Performed: DILATATION AND CURETTAGE /HYSTEROSCOPY (Cervix)  Patient Location: PACU  Anesthesia Type:General  Level of Consciousness: drowsy and patient cooperative  Airway & Oxygen Therapy: Patient Spontanous Breathing and Patient connected to face mask oxygen  Post-op Assessment: Report given to RN and Post -op Vital signs reviewed and stable  Post vital signs: Reviewed and stable  Last Vitals:  Vitals Value Taken Time  BP 103/61 11/17/24 15:22  Temp    Pulse 83 11/17/24 15:25  Resp 20 11/17/24 15:25  SpO2 100 % 11/17/24 15:25  Vitals shown include unfiled device data.  Last Pain:  Vitals:   11/17/24 1225  TempSrc: Temporal  PainSc: 0-No pain         Complications: No notable events documented.

## 2024-11-17 NOTE — Progress Notes (Signed)
 Pt ready for d+c and resection of endometrial polyps . Labs reviewed . All questions answered . Proceed

## 2024-11-18 ENCOUNTER — Encounter: Payer: Self-pay | Admitting: Obstetrics and Gynecology

## 2024-11-19 NOTE — Anesthesia Postprocedure Evaluation (Signed)
 Anesthesia Post Note  Patient: [name]  A [name]  Procedure(s) Performed: DILATATION AND CURETTAGE /HYSTEROSCOPY (Cervix)  Patient location during evaluation: PACU Anesthesia Type: General Level of consciousness: awake and alert Pain management: pain level controlled Vital Signs Assessment: post-procedure vital signs reviewed and stable Respiratory status: spontaneous breathing, nonlabored ventilation, respiratory function stable and patient connected to nasal cannula oxygen Cardiovascular status: blood pressure returned to baseline and stable Postop Assessment: no apparent nausea or vomiting Anesthetic complications: no   No notable events documented.   Last Vitals:  Vitals:   11/17/24 1600 11/17/24 1631  BP: 129/62 (!) (P) 150/58  Pulse: 64 (P) 62  Resp: 19 (P) 20  Temp: 36.4 C (!) (P) 36.3 C  SpO2: 95% (P) 96%    Last Pain:  Vitals:   11/17/24 1631  TempSrc: (P) Temporal  PainSc: (P) 0-No pain                 Debby [place]

## 2024-11-20 LAB — SURGICAL PATHOLOGY
